# Patient Record
Sex: Male | Born: 1935
Health system: Southern US, Community
[De-identification: ages and names within clinical notes are randomized; demographics above are authoritative.]

## PROBLEM LIST (undated history)

## (undated) DIAGNOSIS — I350 Nonrheumatic aortic (valve) stenosis: Secondary | ICD-10-CM

## (undated) DIAGNOSIS — R0602 Shortness of breath: Secondary | ICD-10-CM

## (undated) DIAGNOSIS — I351 Nonrheumatic aortic (valve) insufficiency: Secondary | ICD-10-CM

## (undated) DIAGNOSIS — C3490 Malignant neoplasm of unspecified part of unspecified bronchus or lung: Secondary | ICD-10-CM

## (undated) DIAGNOSIS — I10 Essential (primary) hypertension: Secondary | ICD-10-CM

## (undated) DIAGNOSIS — E785 Hyperlipidemia, unspecified: Secondary | ICD-10-CM

## (undated) DIAGNOSIS — I5022 Chronic systolic (congestive) heart failure: Secondary | ICD-10-CM

## (undated) DIAGNOSIS — I2510 Atherosclerotic heart disease of native coronary artery without angina pectoris: Secondary | ICD-10-CM

## (undated) DIAGNOSIS — I340 Nonrheumatic mitral (valve) insufficiency: Secondary | ICD-10-CM

## (undated) HISTORY — DX: Essential (primary) hypertension: I10

## (undated) HISTORY — DX: Nonrheumatic mitral (valve) insufficiency: I34.0

## (undated) HISTORY — DX: Hyperlipidemia, unspecified: E78.5

## (undated) HISTORY — PX: EYE SURGERY: SHX253

---

## 2003-05-26 ENCOUNTER — Ambulatory Visit (HOSPITAL_COMMUNITY): Admission: RE | Admit: 2003-05-26 | Discharge: 2003-05-26 | Payer: Self-pay | Admitting: *Deleted

## 2011-02-02 ENCOUNTER — Encounter: Payer: Self-pay | Admitting: Cardiology

## 2011-02-02 ENCOUNTER — Ambulatory Visit (INDEPENDENT_AMBULATORY_CARE_PROVIDER_SITE_OTHER): Payer: Medicare Other | Admitting: Cardiology

## 2011-02-02 VITALS — BP 126/80 | HR 80 | Ht 70.0 in | Wt 185.0 lb

## 2011-02-02 DIAGNOSIS — I119 Hypertensive heart disease without heart failure: Secondary | ICD-10-CM

## 2011-02-02 DIAGNOSIS — E7800 Pure hypercholesterolemia, unspecified: Secondary | ICD-10-CM | POA: Insufficient documentation

## 2011-02-02 DIAGNOSIS — I350 Nonrheumatic aortic (valve) stenosis: Secondary | ICD-10-CM

## 2011-02-02 DIAGNOSIS — I359 Nonrheumatic aortic valve disorder, unspecified: Secondary | ICD-10-CM

## 2011-02-02 NOTE — Assessment & Plan Note (Signed)
The patient is on lovastatin 10 mg daily.  His LDL is slightly elevated.  Since we last saw him 18 months ago he has lost 26 pounds through diet and exercise.

## 2011-02-02 NOTE — Patient Instructions (Signed)
Your physician recommends that you continue on your current medications as directed. Please refer to the Current Medication list given to you today.  Your physician wants you to follow-up in: 1 year. You will receive a reminder letter in the mail two months in advance. If you don't receive a letter, please call our office to schedule the follow-up appointment.  

## 2011-02-02 NOTE — Progress Notes (Signed)
Gabriel Marsh Date of Birth:  06/27/1930 [facility] [address] [place], [place]  [address] [phone]  Fax   [phone]  HPI: This pleasant 78 year old gentleman from [facility] is seen for a scheduled followup office visit.  He has a history of essential hypertension and a history of moderate aortic stenosis.  His last echocardiogram was 07/28/09 at which time his peak systolic gradient was 50 and his mean gradient was 31.  The patient has been feeling well since last visit.  He has not been experiencing any chest pain or shortness of breath.  He said no dizzy spells or syncope.  He had a recent carotid Doppler at the [facility] which showed no stenosis.  He had recent lab work showing normal thyroid function and his LDL was slightly elevated at 108.  He goes to the [facility] 3 days a week for exercise which he enjoys.  Current Outpatient Prescriptions  Medication Sig Dispense Refill  . aspirin 81 MG tablet Take 160 mg by mouth daily.      . Cholecalciferol (VITAMIN D PO) Take 2,000 Int'l Units by mouth daily.      Marland Kitchen lovastatin (MEVACOR) 10 MG tablet Take 10 mg by mouth at bedtime.      . valsartan-hydrochlorothiazide (DIOVAN-HCT) 80-12.5 MG per tablet Take 1 tablet by mouth daily.        Allergies  Allergen Reactions  . Penicillins     There is no problem list on file for this patient.   History  Smoking status  . Former Smoker  Smokeless tobacco  . Not on file    History  Alcohol Use: Not on file    No family history on file.  Review of Systems: The patient denies any heat or cold intolerance.  No weight gain or weight loss.  The patient denies headaches or blurry vision.  There is no cough or sputum production.  The patient denies dizziness.  There is no hematuria or hematochezia.  The patient denies any muscle aches or arthritis.  The patient denies any rash.  The patient denies frequent falling or instability.  There is no  history of depression or anxiety.  All other systems were reviewed and are negative.   Physical Exam: Filed Vitals:   02/02/11 0842  BP: 126/80  Pulse: 80   The general appearance reveals a well-developed well-nourished elderly gentleman in no distress.Pupils equal and reactive.   Extraocular Movements are full.  There is no scleral icterus.  The mouth and pharynx are normal.  The neck is supple.  The carotids reveal no bruits.  The jugular venous pressure is normal.  The thyroid is not enlarged.  There is no lymphadenopathy.  The chest is clear to percussion and auscultation. There are no rales or rhonchi. Expansion of the chest is symmetrical.  The heart reveals a grade 3/6 harsh systolic ejection murmur at the base which radiates toward the neck.  No diastolic murmur, gallop or rub.The abdomen is soft and nontender. Bowel sounds are normal. The liver and spleen are not enlarged. There Are no abdominal masses. There are no bruits.  The pedal pulses are good.  There is no phlebitis or edema.  There is no cyanosis or clubbing. Strength is normal and symmetrical in all extremities.  There is no lateralizing weakness.  There are no sensory deficits.  EKG today shows normal sinus rhythm and no ischemic changes.  He has old Q waves in the inferior leads but  no prior history of an inferior wall MI EKG has been unchanged since July of 2000.   Assessment / Plan: Continue same medication.  Recheck in one year for office visit and EKG and after that consider followup echocardiogram.

## 2011-02-02 NOTE — Assessment & Plan Note (Signed)
The patient is not having any cardinal symptoms from his aortic stenosis.  Exercise tolerance remains good

## 2011-06-01 ENCOUNTER — Encounter: Payer: Self-pay | Admitting: *Deleted

## 2011-08-22 HISTORY — PX: CARDIAC CATHETERIZATION: SHX172

## 2011-08-23 ENCOUNTER — Inpatient Hospital Stay (HOSPITAL_COMMUNITY)
Admission: AD | Admit: 2011-08-23 | Discharge: 2011-09-08 | DRG: 219 | Disposition: A | Discharge status: Rehabilitation Facility | Payer: Medicare Other | Source: Other Acute Inpatient Hospital | Attending: Cardiothoracic Surgery | Admitting: Cardiothoracic Surgery

## 2011-08-23 ENCOUNTER — Encounter (HOSPITAL_COMMUNITY): Payer: Self-pay | Admitting: Cardiology

## 2011-08-23 ENCOUNTER — Other Ambulatory Visit: Payer: Self-pay | Admitting: Cardiothoracic Surgery

## 2011-08-23 DIAGNOSIS — D72829 Elevated white blood cell count, unspecified: Secondary | ICD-10-CM | POA: Diagnosis not present

## 2011-08-23 DIAGNOSIS — I472 Ventricular tachycardia, unspecified: Secondary | ICD-10-CM | POA: Diagnosis not present

## 2011-08-23 DIAGNOSIS — I350 Nonrheumatic aortic (valve) stenosis: Secondary | ICD-10-CM

## 2011-08-23 DIAGNOSIS — I5023 Acute on chronic systolic (congestive) heart failure: Secondary | ICD-10-CM

## 2011-08-23 DIAGNOSIS — E785 Hyperlipidemia, unspecified: Secondary | ICD-10-CM | POA: Diagnosis present

## 2011-08-23 DIAGNOSIS — R5381 Other malaise: Secondary | ICD-10-CM | POA: Diagnosis not present

## 2011-08-23 DIAGNOSIS — E119 Type 2 diabetes mellitus without complications: Secondary | ICD-10-CM | POA: Diagnosis present

## 2011-08-23 DIAGNOSIS — I2510 Atherosclerotic heart disease of native coronary artery without angina pectoris: Secondary | ICD-10-CM

## 2011-08-23 DIAGNOSIS — E876 Hypokalemia: Secondary | ICD-10-CM | POA: Diagnosis not present

## 2011-08-23 DIAGNOSIS — I4901 Ventricular fibrillation: Secondary | ICD-10-CM | POA: Diagnosis not present

## 2011-08-23 DIAGNOSIS — I469 Cardiac arrest, cause unspecified: Secondary | ICD-10-CM | POA: Diagnosis not present

## 2011-08-23 DIAGNOSIS — I2789 Other specified pulmonary heart diseases: Secondary | ICD-10-CM | POA: Diagnosis present

## 2011-08-23 DIAGNOSIS — I4891 Unspecified atrial fibrillation: Secondary | ICD-10-CM

## 2011-08-23 DIAGNOSIS — I214 Non-ST elevation (NSTEMI) myocardial infarction: Secondary | ICD-10-CM

## 2011-08-23 DIAGNOSIS — I4729 Other ventricular tachycardia: Secondary | ICD-10-CM | POA: Diagnosis not present

## 2011-08-23 DIAGNOSIS — I080 Rheumatic disorders of both mitral and aortic valves: Principal | ICD-10-CM | POA: Diagnosis present

## 2011-08-23 DIAGNOSIS — I359 Nonrheumatic aortic valve disorder, unspecified: Secondary | ICD-10-CM

## 2011-08-23 DIAGNOSIS — J9600 Acute respiratory failure, unspecified whether with hypoxia or hypercapnia: Secondary | ICD-10-CM

## 2011-08-23 DIAGNOSIS — I2589 Other forms of chronic ischemic heart disease: Secondary | ICD-10-CM | POA: Diagnosis present

## 2011-08-23 DIAGNOSIS — J95821 Acute postprocedural respiratory failure: Secondary | ICD-10-CM | POA: Diagnosis not present

## 2011-08-23 DIAGNOSIS — I340 Nonrheumatic mitral (valve) insufficiency: Secondary | ICD-10-CM | POA: Diagnosis present

## 2011-08-23 DIAGNOSIS — R339 Retention of urine, unspecified: Secondary | ICD-10-CM | POA: Diagnosis not present

## 2011-08-23 DIAGNOSIS — I4721 Torsades de pointes: Secondary | ICD-10-CM

## 2011-08-23 DIAGNOSIS — I5022 Chronic systolic (congestive) heart failure: Secondary | ICD-10-CM

## 2011-08-23 DIAGNOSIS — Z79899 Other long term (current) drug therapy: Secondary | ICD-10-CM

## 2011-08-23 DIAGNOSIS — D62 Acute posthemorrhagic anemia: Secondary | ICD-10-CM | POA: Diagnosis not present

## 2011-08-23 DIAGNOSIS — I351 Nonrheumatic aortic (valve) insufficiency: Secondary | ICD-10-CM

## 2011-08-23 DIAGNOSIS — K120 Recurrent oral aphthae: Secondary | ICD-10-CM | POA: Diagnosis not present

## 2011-08-23 DIAGNOSIS — I10 Essential (primary) hypertension: Secondary | ICD-10-CM | POA: Diagnosis present

## 2011-08-23 DIAGNOSIS — Z7982 Long term (current) use of aspirin: Secondary | ICD-10-CM

## 2011-08-23 DIAGNOSIS — R570 Cardiogenic shock: Secondary | ICD-10-CM

## 2011-08-23 HISTORY — DX: Nonrheumatic aortic (valve) insufficiency: I35.1

## 2011-08-23 HISTORY — DX: Shortness of breath: R06.02

## 2011-08-23 HISTORY — DX: Nonrheumatic aortic (valve) stenosis: I35.0

## 2011-08-23 HISTORY — DX: Atherosclerotic heart disease of native coronary artery without angina pectoris: I25.10

## 2011-08-23 HISTORY — DX: Chronic systolic (congestive) heart failure: I50.22

## 2011-08-23 LAB — CARDIAC PANEL(CRET KIN+CKTOT+MB+TROPI)
CK, MB: 8.8 ng/mL (ref 0.3–4.0)
CK, MB: 7.2 ng/mL (ref 0.3–4.0)
Relative Index: 7.9 — ABNORMAL HIGH (ref 0.0–2.5)
Total CK: 112 U/L (ref 7–232)
Total CK: 98 U/L (ref 7–232)
Troponin I: 1.84 ng/mL (ref <0.30)

## 2011-08-23 LAB — CBC
HCT: 36.7 % — ABNORMAL LOW (ref 39.0–52.0)
Hemoglobin: 12.4 g/dL — ABNORMAL LOW (ref 13.0–17.0)
MCH: 30.2 pg (ref 26.0–34.0)
RBC: 4.11 MIL/uL — ABNORMAL LOW (ref 4.22–5.81)

## 2011-08-23 LAB — BASIC METABOLIC PANEL
BUN: 26 mg/dL — ABNORMAL HIGH (ref 6–23)
CO2: 26 mEq/L (ref 19–32)
Glucose, Bld: 101 mg/dL — ABNORMAL HIGH (ref 70–99)
Potassium: 3.9 mEq/L (ref 3.5–5.1)
Sodium: 138 mEq/L (ref 135–145)

## 2011-08-23 LAB — MRSA PCR SCREENING: MRSA by PCR: NEGATIVE

## 2011-08-23 MED ORDER — SODIUM CHLORIDE 0.9 % IJ SOLN
3.0000 mL | INTRAMUSCULAR | Status: DC | PRN
  Administered 2011-08-24: 10:00:00 via INTRAVENOUS

## 2011-08-23 MED ORDER — HEPARIN SODIUM (PORCINE) 5000 UNIT/ML IJ SOLN
5000.0000 [IU] | Freq: Three times a day (TID) | INTRAMUSCULAR | Status: DC
  Administered 2011-08-23 (×2): 5000 [IU] via SUBCUTANEOUS
  Filled 2011-08-23 (×3): qty 1

## 2011-08-23 MED ORDER — ASPIRIN 81 MG PO CHEW
81.0000 mg | CHEWABLE_TABLET | Freq: Every day | ORAL | Status: DC
  Administered 2011-08-23 – 2011-08-24 (×2): 81 mg via ORAL
  Filled 2011-08-23 (×2): qty 1

## 2011-08-23 MED ORDER — ZOLPIDEM TARTRATE 5 MG PO TABS
5.0000 mg | ORAL_TABLET | Freq: Every evening | ORAL | Status: DC | PRN
  Administered 2011-08-24: 5 mg via ORAL
  Filled 2011-08-23: qty 1

## 2011-08-23 MED ORDER — MORPHINE SULFATE 2 MG/ML IJ SOLN
1.0000 mg | Freq: Once | INTRAMUSCULAR | Status: AC
  Administered 2011-08-24: 1 mg via INTRAVENOUS
  Filled 2011-08-23: qty 1

## 2011-08-23 MED ORDER — ALPRAZOLAM 0.25 MG PO TABS: 0.2500 mg | ORAL_TABLET | Freq: Two times a day (BID) | ORAL | Status: DC | PRN

## 2011-08-23 MED ORDER — IRBESARTAN 75 MG PO TABS
75.0000 mg | ORAL_TABLET | Freq: Every day | ORAL | Status: DC
  Administered 2011-08-23: 75 mg via ORAL
  Filled 2011-08-23 (×2): qty 1

## 2011-08-23 MED ORDER — SODIUM CHLORIDE 0.9 % IJ SOLN
3.0000 mL | Freq: Two times a day (BID) | INTRAMUSCULAR | Status: DC
  Administered 2011-08-23 – 2011-08-24 (×3): 3 mL via INTRAVENOUS

## 2011-08-23 MED ORDER — ASPIRIN 81 MG PO TABS: 81.0000 mg | ORAL_TABLET | Freq: Every day | ORAL | Status: DC

## 2011-08-23 MED ORDER — ACETAMINOPHEN 325 MG PO TABS
650.0000 mg | ORAL_TABLET | ORAL | Status: DC | PRN
  Administered 2011-08-23: 650 mg via ORAL
  Filled 2011-08-23: qty 2

## 2011-08-23 MED ORDER — SIMVASTATIN 5 MG PO TABS
5.0000 mg | ORAL_TABLET | Freq: Every day | ORAL | Status: DC
  Administered 2011-08-23 – 2011-09-07 (×11): 5 mg via ORAL
  Filled 2011-08-23 (×18): qty 1

## 2011-08-23 MED ORDER — SODIUM CHLORIDE 0.9 % IV BOLUS (SEPSIS)
250.0000 mL | Freq: Once | INTRAVENOUS | Status: AC
  Administered 2011-08-23: 250 mL via INTRAVENOUS

## 2011-08-23 MED ORDER — ONDANSETRON HCL 4 MG/2ML IJ SOLN
4.0000 mg | Freq: Four times a day (QID) | INTRAMUSCULAR | Status: DC | PRN
  Administered 2011-08-23 – 2011-08-24 (×3): 4 mg via INTRAVENOUS
  Filled 2011-08-23 (×3): qty 2

## 2011-08-23 MED ORDER — NITROGLYCERIN 0.4 MG SL SUBL: 0.4000 mg | SUBLINGUAL_TABLET | SUBLINGUAL | Status: DC | PRN

## 2011-08-23 MED ORDER — CARVEDILOL 3.125 MG PO TABS
3.1250 mg | ORAL_TABLET | Freq: Two times a day (BID) | ORAL | Status: DC
  Administered 2011-08-23: 3.125 mg via ORAL
  Filled 2011-08-23 (×4): qty 1

## 2011-08-23 MED ORDER — DOPAMINE-DEXTROSE 3.2-5 MG/ML-% IV SOLN
2.5000 ug/kg/min | INTRAVENOUS | Status: DC
  Administered 2011-08-23: 2.5 ug/kg/min via INTRAVENOUS
  Administered 2011-08-25: 10 ug/kg/min via INTRAVENOUS
  Filled 2011-08-23 (×2): qty 250

## 2011-08-23 MED ORDER — SODIUM CHLORIDE 0.9 % IV SOLN: 250.0000 mL | INTRAVENOUS | Status: DC | PRN

## 2011-08-23 NOTE — Progress Notes (Signed)
CRITICAL VALUE ALERT  Critical value received:  CKMB 8.8  Date of notification:  08/23/11  Time of notification: 1700  Critical value read back: Yes  Nurse who received alert:  Suzzette Righter  MD notified (1st page):  Hurman Horn   Time of first page:  1715 MD notified (2nd page):  Time of second page:  Responding MD:  Hurman Horn  No orders received  Time MD responded:  [phone]

## 2011-08-23 NOTE — Progress Notes (Signed)
Patient ID: Gabriel Marsh, male   DOB: 1930/02/21, 78 y.o.   MRN: [id number]                    [address]            [place] [address]          [phone]       [facility] Medical Record #[id number] Date of Birth: March 02, 1930  Referring: Dr Patty Sermons since 1989 Primary Care: Dr Ovidio Kin, Renne Musca   Chief Complaint:   Rt shoulder pain and SOB for week   History of Present Illness:    Gabriel Marsh is a 78yo Caucasian male transfereed  Because one week history of increasing sob and exercise induced rt  shoulder pain The patient noted worsening shortness of breath with orthopnea and PND over one  weeks prior to initial admission at OSH. He presented to the [facility] He was admitted to [facility] with flash pulmonary edema secondary to acute systolic CHF (the chronicity of which is unclear, no prior echo on record). He was noted to have a mild troponin-I elevated characterized as type 2 NSTEMI in the setting of acute CHF exacerbation. He was given treated medically and under went cath and echo.    PMHx significant for severe AS (on echo at OSH on 08/20/11; moderate in 2011), CAD (cath at OSH 08/22/11 : 50-60% left main stenosis, mild-moderate LAD disease, 99% ostial large diagonal stenosis, occluded and collateralized LCx and RCA), newly diagnosed systolic CHF (echo 08/20/11: LVEF 35-40% inferior wall akinesis RHC 08/19/11: RV pressure 61/5, PCWP 31, RA pressure 13, Fick CO 5.5 lpm), moderate AI, moderate MR, hyperlipidemia and hypertension who was transferred to [facility] from [facility] to undergo CABG + AVR. The patient initially presented with flash pulmonary edema, diuresed and started on HF medications, underwent cath and echo with the above findings. The patient requested to be transferred here due to his care being mostly here in [place].   Current Activity/ Functional Status: Patient is independent with  mobility/ambulation, transfers, ADL's, IADL's.   Past Medical History  Diagnosis Date  . Hypertension   . Hyperlipidemia   . Mitral regurgitation     Moderate by 08/20/11 echo  . Shortness of breath   . CAD (coronary artery disease)     a) 08/22/11 cath :  50-60% left main stenosis, mild-moderate LAD disease, 99% ostial large diagonal stenosis, occluded and collateralized LCx and RCA  . Aortic stenosis     Severe by 08/20/11 echo  . Aortic insufficiency     Moderate by 08/20/11 echo  . Chronic systolic CHF (congestive heart failure)     a) 08/20/11 echo : LVEF 35-40%    Past Surgical History  Procedure Date  . Eye surgery   . Cardiac catheterization 08/22/11     50-60% left main stenosis, mild-moderate LAD disease, 99% ostial large diagonal stenosis, occluded and collateralized LCx and RCA    History  Smoking status  . Former Smoker  . Quit date: 01/25/1995  Smokeless tobacco  . Not on file    History  Alcohol Use No    History   Social History  . Marital Status: Single    Spouse Name: N/A    Number of Children: N/A  . Years of Education: N/A   Occupational History  . Desk work for [employer]   Social History Main Topics  . Smoking  status: Former Smoker    Quit date: 01/25/1995  . Smokeless tobacco: Not on file  . Alcohol Use: No  . Drug Use: No                     Allergies  Allergen Reactions  . Fish Allergy   . Lisinopril     cough  . Penicillins     Hives   . Statins     Leg cramps  . Sulfa Antibiotics     Hives     Current Facility-Administered Medications  Medication Dose Route Frequency Provider Last Rate Last Dose  . 0.9 %  sodium chloride infusion  250 mL Intravenous PRN Roger A Arguello, PA-C      . acetaminophen (TYLENOL) tablet 650 mg  650 mg Oral Q4H PRN Roger A Arguello, PA-C      . ALPRAZolam Prudy Feeler) tablet 0.25 mg  0.25 mg Oral BID PRN Gery Pray, PA-C      . aspirin chewable tablet 81 mg  81 mg Oral Daily  Herby Abraham, MD      . carvedilol (COREG) tablet 3.125 mg  3.125 mg Oral BID WC Roger A Arguello, PA-C      . heparin injection 5,000 Units  5,000 Units Subcutaneous Q8H Roger A Arguello, PA-C      . irbesartan (AVAPRO) tablet 75 mg  75 mg Oral Daily Roger A Arguello, PA-C      . nitroGLYCERIN (NITROSTAT) SL tablet 0.4 mg  0.4 mg Sublingual Q5 Min x 3 PRN Roger A Arguello, PA-C      . ondansetron (ZOFRAN) injection 4 mg  4 mg Intravenous Q6H PRN Roger A Arguello, PA-C      . simvastatin (ZOCOR) tablet 5 mg  5 mg Oral q1800 Roger A Arguello, PA-C      . sodium chloride 0.9 % injection 3 mL  3 mL Intravenous Q12H Roger A Arguello, PA-C      . sodium chloride 0.9 % injection 3 mL  3 mL Intravenous PRN Roger A Arguello, PA-C      . zolpidem (AMBIEN) tablet 5 mg  5 mg Oral QHS PRN Roger A Arguello, PA-C      . DISCONTD: aspirin tablet 81 mg  81 mg Oral Daily Roger A Arguello, PA-C        Prescriptions prior to admission  Medication Sig Dispense Refill  . aspirin 81 MG tablet Take 160 mg by mouth daily.      Marland Kitchen DISCONTD: Cholecalciferol (VITAMIN D PO) Take 2,000 Int'l Units by mouth daily.      Marland Kitchen DISCONTD: valsartan-hydrochlorothiazide (DIOVAN-HCT) 80-12.5 MG per tablet Take 1 tablet by mouth daily.        Family History  Problem Relation Age of Onset  . Heart disease Brother   Mother died liver cancer Father died of leukemia   Review of Systems:     Cardiac Review of Systems: Y or N  Chest Pain [  n  ]  Resting SOB [n   ] Exertional SOB  [ y ]  Orthopnea [  y]   Pedal Edema [ n  ]    Palpitations [n  ] Syncope  [ n ]   Presyncope [ n  ]  General Review of Systems: [Y] = yes [N  ]=no Constitional: recent weight change [ loss ]; anorexia [  ]; fatigue [ y ]; nausea [n  ]; night sweats [ n ]; fever [  n]; or chills [ n ];                                                                                                                                          Dental: poor dentition[ gone   ]; Last Dentist visit: 50 years ago  Eye : blurred vision [  ]; diplopia [  n ]; vision changes [  n];  Amaurosis fugax[n  ]; VERY HARD OF HEARING Resp: cough [  ];  wheezing[  ];  hemoptysis[  ]; shortness of breath[  ]; paroxysmal nocturnal dyspnea[  ]; dyspnea on exertion[  ]; or orthopnea[  ];  GI:  gallstones[  ], vomiting[  ];  dysphagia[  ]; melena[  ];  hematochezia [  ]; heartburn[  ];   Hx of  Colonoscopy[  ]; GU: kidney stones [  ]; hematuria[ n ];   dysuria [  ];  Nocturia[4-5 times per night  ];  history of     obstruction [  ];             Skin: rash, swelling[  ];, hair loss[  ];  peripheral edema[  ];  or itching[  ]; Musculosketetal: myalgias[  ];  joint swelling[  ];  joint erythema[  ];  joint pain[  ];  back pain[  ];  Heme/Lymph: bruising[  ];  bleeding[  ];  anemia[  ];  Neuro: TIA[ n ];  headaches[  ];  stroke[  ];  vertigo[  ];  seizures[n  ];   paresthesias[  ];  difficulty walking[ n ];  Psych:depression[  ]; anxiety[  ];  Endocrine: diabetes[  ];  thyroid dysfunction[  ];  Immunizations: Flu Cove.Etienne  ]; Pneumococcal[ y ];  Other:  Physical Exam: BP 101/63  Pulse 71  Temp 98.3 F (36.8 C) (Oral)  Resp 17  Ht 5\' 10"  (1.778 m)  Wt 174 lb 9.7 oz (79.2 kg)  BMI 25.05 kg/m2  SpO2 96%  General appearance: alert, cooperative, appears stated age, no distress and very hard of hearing Neurologic: intact Heart: regular rate and rhythm and systolic murmur: holosystolic 3/6, crescendo at lower left sternal border, at apex Lungs: clear to auscultation bilaterally and normal percussion bilaterally Abdomen: soft, non-tender; bowel sounds normal; no masses,  no organomegaly Extremities: extremities normal, atraumatic, no cyanosis or edema and Homans sign is negative, no sign of DVT rt cath site ok No carotid bruits No cervical, axillary or groin adenotathy  Diagnostic Studies & Laboratory data:   severe AS (on echo at OSH on 08/20/11; moderate in 2011), CAD (cath at OSH  08/22/11 : 50-60% left main stenosis, mild-moderate LAD disease, 99% ostial large diagonal stenosis, occluded and collateralized LCx and RCA), newly diagnosed systolic CHF (echo 08/20/11: LVEF 35-40% inferior wall akinesis RHC 08/19/11: RV pressure 61/5, PCWP 31, RA pressure 13, Fick CO 5.5 lpm),  moderate AI, moderate MR,    Recent Radiology Findings:     Recent Lab Findings:  Lab Results  Component Value Date   WBC 8.2 08/23/2011   HGB 12.4* 08/23/2011   HCT 36.7* 08/23/2011   PLT 242 08/23/2011   GLUCOSE 101* 08/23/2011   NA 138 08/23/2011   K 3.9 08/23/2011   CL 102 08/23/2011   CREATININE 0.79 08/23/2011   BUN 26* 08/23/2011   CO2 26 08/23/2011    Lab Results  Component Value Date   CKTOTAL 112 08/23/2011   CKMB 8.8* 08/23/2011   TROPONINI 1.84* 08/23/2011      Assessment / Plan:   Acute systolic congestive heart failure secondary to critical AS and CAD Non st elevation MI Cardiomyopathy EF by echo 35-40 % with pulmonary hypertension  Plan:  CABG with tissue valve aortic valve replacement Friday or Monday depending on OR schedule.   The goals risks and alternatives of the planned surgical procedure CABG and AVR  have been discussed with the patient in detail. The risks of the procedure including death, infection, stroke, myocardial infarction, bleeding, blood transfusion possible need for pacer placement have all been discussed specifically.  I have quoted [name] a 5 % of perioperative mortality and a complication rate as high as 20 %. The patient's questions have been answered.Gabriel Marsh is willing  to proceed with the planned procedure.     Delight Ovens MD  Beeper [phone] Office [phone] 08/23/2011 5:06 PM

## 2011-08-23 NOTE — H&P (Signed)
History and Physical   Patient ID: Gabriel Marsh MRN: [id number], DOB/AGE: 24-Nov-1930, 78   Admit date: 08/23/2011 Date of Consult: 08/23/2011   Primary Physician: No primary provider on file. Primary Cardiologist: Ronny Flurry, MD  Pt. Profile: Gabriel Marsh is a 78yo Caucasian male with PMHx significant for severe AS (on echo at OSH on 08/20/11; moderate in 2011), CAD (cath at OSH 08/22/11 : 50-60% left main stenosis, mild-moderate LAD disease, 99% ostial large diagonal stenosis, occluded and collateralized LCx and RCA), newly diagnosed systolic CHF (echo 08/20/11: LVEF 35-40% inferior wall akinesis RHC 08/19/11: RV pressure 61/5, PCWP 31, RA pressure 13, Fick CO 5.5 lpm), moderate AI, moderate MR, hyperlipidemia and hypertension who was transferred to [facility] hospital from [facility] to undergo CABG + AVR. The patient initially presented with flash pulmonary edema, diuresed and started on HF medications, underwent cath and echo with the above findings. The patient requested to be transferred here due to his care being mostly here in [place].   HPI:   The patient lives in [place], [place], but follows up with Dr. Patty Sermons here in [place]. He last saw him in 01/2011. At that time, he denied chest pain, shortness of breath, lightheadedness or syncope. He had noted to be quite active, going to the [facility] 3x/wk without incident conferring good exercise tolerance.   The patient noted worsening shortness of breath with orthopnea and PND over 1-2 weeks prior to initial admission at OSH. He presented to the [facility] He was admitted to [facility] with flash pulmonary edema secondary to acute systolic CHF (the chronicity of which is unclear, no prior echo on record). He was noted to have a mild troponin-I elevated characterized as type 2 NSTEMI in the setting of acute CHF exacerbation. He was diuresed, and a 2D echo was performed revealing LVEF 35-40%, severe AS, moderate AI/MR and inferior  wall akinesis. Subsequent cardiac cath revealed the above findings (pre-AVR cath). The decision was made to pursue CABG, and for that reason, simultaneous open AVR was elected over transcatheter AVR. The patient elected to have this performed in [place], as that is where he follows up with Dr. Patty Sermons. He arrived via EMS in stable condition, pain free, respirating easily and is comfortable.   Problem List: Past Medical History  Diagnosis Date  . Hypertension   . Hyperlipidemia   . Mitral regurgitation     Moderate by 08/20/11 echo  . Shortness of breath   . CAD (coronary artery disease)     a) 08/22/11 cath :  50-60% left main stenosis, mild-moderate LAD disease, 99% ostial large diagonal stenosis, occluded and collateralized LCx and RCA  . Aortic stenosis     Severe by 08/20/11 echo  . Aortic insufficiency     Moderate by 08/20/11 echo  . Chronic systolic CHF (congestive heart failure)     a) 08/20/11 echo : LVEF 35-40%    Past Surgical History  Procedure Date  . Eye surgery   . Cardiac catheterization 08/22/11     50-60% left main stenosis, mild-moderate LAD disease, 99% ostial large diagonal stenosis, occluded and collateralized LCx and RCA     Allergies:  Allergies  Allergen Reactions  . Fish Allergy   . Lisinopril     cough  . Penicillins     Hives   . Statins     Leg cramps  . Sulfa Antibiotics     Hives     Home Medications: Prior to Admission medications  Medication Sig Start Date End Date Taking? Authorizing Provider  aspirin 81 MG tablet Take 160 mg by mouth daily.    Historical Provider, MD  Cholecalciferol (VITAMIN D PO) Take 2,000 Int'l Units by mouth daily.    Historical Provider, MD  lovastatin (MEVACOR) 10 MG tablet Take 10 mg by mouth at bedtime.    Historical Provider, MD  valsartan-hydrochlorothiazide (DIOVAN-HCT) 80-12.5 MG per tablet Take 1 tablet by mouth daily.    Historical Provider, MD    Inpatient Medications:     . aspirin  81 mg  Oral Daily  . carvedilol  3.125 mg Oral BID WC  . heparin  5,000 Units Subcutaneous Q8H  . irbesartan  75 mg Oral Daily  . simvastatin  5 mg Oral q1800  . sodium chloride  3 mL Intravenous Q12H   Prescriptions prior to admission  Medication Sig Dispense Refill  . aspirin 81 MG tablet Take 160 mg by mouth daily.      Marland Kitchen DISCONTD: Cholecalciferol (VITAMIN D PO) Take 2,000 Int'l Units by mouth daily.      Marland Kitchen DISCONTD: valsartan-hydrochlorothiazide (DIOVAN-HCT) 80-12.5 MG per tablet Take 1 tablet by mouth daily.        Family History  Problem Relation Age of Onset  . Heart disease Brother      History   Social History  . Marital Status: Single    Spouse Name: N/A    Number of Children: N/A  . Years of Education: N/A   Occupational History  . Not on file.   Social History Main Topics  . Smoking status: Former Smoker    Quit date: 01/25/1995  . Smokeless tobacco: Not on file  . Alcohol Use: No  . Drug Use: No  . Sexually Active: Not on file   Other Topics Concern  . Not on file   Social History Narrative  . No narrative on file     Review of Systems: General: negative for chills, fever, night sweats or weight changes.  Cardiovascular: positive for shortness of breath, PND, DOE, negative for chest pain, orthopnea, palpitations or shortness of breath Dermatological: negative for rash Respiratory: negative for cough or wheezing Urologic: negative for hematuria Abdominal: negative for nausea, vomiting, diarrhea, bright red blood per rectum, melena, or hematemesis Neurologic:  negative for visual changes, syncope, or dizziness All other systems reviewed and are otherwise negative except as noted above.  Physical Exam: Blood pressure 101/63, pulse 71, temperature 98.3 F (36.8 C), temperature source Oral, resp. rate 17, height 5\' 10"  (1.778 m), weight 79.2 kg (174 lb 9.7 oz), SpO2 96.00%.    General: Elderly, well developed, well nourished, in no acute distress. Head:  Normocephalic, atraumatic, sclera non-icteric, no xanthomas, nares are without discharge.  Neck: Negative for carotid bruits. JVD not elevated. Lungs: Clear bilaterally to auscultation without wheezes, rales, or rhonchi. Breathing is unlabored. Heart: Regularly irregular, harsh III/VI systolic crescendo-decrescendo murmur at LUSB, with S1 S2. No murmurs, rubs, or gallops appreciated. Abdomen: Soft, non-tender, non-distended with normoactive bowel sounds. No hepatomegaly. No rebound/guarding. No obvious abdominal masses. Msk:  Strength and tone appears normal for age. Extremities: No clubbing, cyanosis or edema.  Distal pedal pulses are 2+ and equal bilaterally. Neuro: Alert and oriented X 3. Moves all extremities spontaneously. Psych:  Responds to questions appropriately with a normal affect.  Labs:  Pending  Radiology/Studies: No results found.  EKG: pending, at OSH revealed evidence of trigeminy, no ST-T wave changes  ASSESSMENT AND PLAN:   1.  Severe AS 2. CAD 3. Chronic systolic CHF 4. Hypertension 5. Hyperlipidemia 6. Moderate AI 7. Moderate MR  DICUSSION/PLAN:  The patient was transferred from [facility] to [facility] to coordinate CABG + open AVR in [place], and continue prior cardiac care with Dr. Patty Sermons. He arrived this afternoon and is comfortable without complaints. Will continue discharge medications from OSH including ARB and BB. Patient apparently has an allergy to lisinopril. Will continue ASA, statin (LDL 101 on 08/19/11), NTG SL PRN. [facility] is aware of patient's arrival. Cath films are up on server for review. Will defer further recommendations from a surgical standpoint to them. Admission orders placed. Will get CBC, BMET, PT/INR and cardiac panel. Heart healthy diet ordered. Will continue to monitor for need to diurese. Strict I/Os and daily weights have been ordered. Further recommendations per MD.    Signed, R. Hurman Horn,  PA-C 08/23/2011, 4:30 PM

## 2011-08-23 NOTE — Care Management Note (Signed)
    Page 1 of 2   09/09/2011     2:22:52 PM   CARE MANAGEMENT NOTE 09/09/2011  Patient:  Gabriel Marsh, Gabriel Marsh   Account Number:  [id number]  Date Initiated:  08/23/2011  Documentation initiated by:  Junius Creamer  Subjective/Objective Assessment:   adm from [facility] after pul edema and pos cath-for cabg and valve rep     Action/Plan:   lives alone, pcp dr Gabriel Marsh is his cardiologist   Anticipated DC Date:  09/02/2011   Anticipated DC Plan:  IP REHAB FACILITY      DC Planning Services  CM consult      Choice offered to / List presented to:             Status of service:  Completed, signed off Medicare Important Message given?   (If response is "NO", the following Medicare IM given date fields will be blank) Date Medicare IM given:   Date Additional Medicare IM given:    Discharge Disposition:  IP REHAB FACILITY  Per UR Regulation:  Reviewed for med. necessity/level of care/duration of stay  If discussed at Long Length of Stay Meetings, dates discussed:    Comments:  09/08/11 Gabriel Molinelli,RN,BSN PT DISCHARGED TO IP REHAB TODAY.  09/07/11 Gabriel Brigandi,RN,BSN 1130 CALLED [facility] [phone]), AS I HAVE NOT HEARD BACK FROM DR BALE'S OFFICE.  CONTACT STATES THAT DC MED RX NEED TO BE FAXED TO DR BALE'S OFFICE ASAP (FAX [phone]), [facility] BALE [facility], AND THEN PT'S NIECE CAN PICK UP RX WHEN READY. EXPLAINED TO NIECE THAT WE DO NOT KNOW FINAL DC MEDS AT THIS TIME, AS PT STILL UNDERGOING MULT MED ADJUSTMENTS.  08/30/11 Gabriel Fatula,RN,BSN 1100 PT'S NIECE GIVEN RESOURCE INFO FOR LIFT CHAIR RENTALS IN THE AREA. SHE IS CONSIDERING POSSIBLY RENTING OR BUYING ONE FOR PT WHEN HE IS DC'D.  LEFT MESSAGE FOR DR BALE'S OFFICE (PT[facility] PR[facility]) TO SPEAK WITH SOMEONE ABOUT PT OBTAINING DC MEDS.  WILL FOLLOW.  08/26/11 Gabriel Jarrett,RN,BSN 1200 PT S/P CABG AND AVR ON 08/25/11.  PTA, PT INDEPENDENT, LIVES ALONE IN [place].  PT REMAINS  INTUBATED; MET WITH PT'S NIECE, Gabriel Marsh, AT BEDSIDE, TO DISCUSS DC PLANS.  NIECE STATES PT WILL STAY WITH HER IN [place] FOR A MONTH OR LONGER UNTIL ABLE TO RETURN TO [place] ALONE.  IF PT NEEDS REHAB, NIECE REQUESTS IP REHAB,AS THEY DO NOT WANT SNF.  PT HAS [employer], AND REQUESTS IF POSSIBLE THAT DC RX BE GIVEN TO HER A DAY OR SO PRIOR TO DC SO THAT SHE CAN GO TO [place] TO GET RX FILLED.  WILL FOLLOW UP AS PT PROGRESSES.  7/30 16:48p Gabriel dowell rn,bsn [phone]

## 2011-08-24 ENCOUNTER — Encounter (HOSPITAL_COMMUNITY)
Admission: AD | Disposition: A | Discharge status: Rehabilitation Facility | Payer: Self-pay | Source: Other Acute Inpatient Hospital | Attending: Cardiothoracic Surgery

## 2011-08-24 ENCOUNTER — Inpatient Hospital Stay (HOSPITAL_COMMUNITY): Payer: Medicare Other

## 2011-08-24 ENCOUNTER — Encounter (HOSPITAL_COMMUNITY): Payer: Medicare Other

## 2011-08-24 DIAGNOSIS — I469 Cardiac arrest, cause unspecified: Secondary | ICD-10-CM

## 2011-08-24 DIAGNOSIS — I2510 Atherosclerotic heart disease of native coronary artery without angina pectoris: Secondary | ICD-10-CM

## 2011-08-24 DIAGNOSIS — R570 Cardiogenic shock: Secondary | ICD-10-CM

## 2011-08-24 DIAGNOSIS — I059 Rheumatic mitral valve disease, unspecified: Secondary | ICD-10-CM

## 2011-08-24 DIAGNOSIS — Z01810 Encounter for preprocedural cardiovascular examination: Secondary | ICD-10-CM

## 2011-08-24 DIAGNOSIS — I359 Nonrheumatic aortic valve disorder, unspecified: Secondary | ICD-10-CM

## 2011-08-24 DIAGNOSIS — I214 Non-ST elevation (NSTEMI) myocardial infarction: Secondary | ICD-10-CM

## 2011-08-24 HISTORY — PX: INTRA-AORTIC BALLOON PUMP INSERTION: SHX5475

## 2011-08-24 LAB — COMPREHENSIVE METABOLIC PANEL
ALT: 39 U/L (ref 0–53)
AST: 165 U/L — ABNORMAL HIGH (ref 0–37)
Albumin: 3 g/dL — ABNORMAL LOW (ref 3.5–5.2)
Alkaline Phosphatase: 57 U/L (ref 39–117)
BUN: 35 mg/dL — ABNORMAL HIGH (ref 6–23)
CO2: 26 mEq/L (ref 19–32)
Calcium: 8.8 mg/dL (ref 8.4–10.5)
Chloride: 102 mEq/L (ref 96–112)
Creatinine, Ser: 0.72 mg/dL (ref 0.50–1.35)
GFR calc Af Amer: >90 mL/min (ref >90)
GFR calc non Af Amer: 86 mL/min — ABNORMAL LOW (ref >90)
Glucose, Bld: 134 mg/dL — ABNORMAL HIGH (ref 70–99)
Potassium: 3.7 mEq/L (ref 3.5–5.1)
Sodium: 138 mEq/L (ref 135–145)
Total Bilirubin: 0.4 mg/dL (ref 0.3–1.2)
Total Protein: 6.5 g/dL (ref 6.0–8.3)

## 2011-08-24 LAB — BASIC METABOLIC PANEL
BUN: 34 mg/dL — ABNORMAL HIGH (ref 6–23)
BUN: 36 mg/dL — ABNORMAL HIGH (ref 6–23)
CO2: 26 mEq/L (ref 19–32)
CO2: 26 mEq/L (ref 19–32)
Calcium: 8.8 mg/dL (ref 8.4–10.5)
Chloride: 105 mEq/L (ref 96–112)
Chloride: 103 mEq/L (ref 96–112)
Creatinine, Ser: 0.8 mg/dL (ref 0.50–1.35)
GFR calc Af Amer: 90 mL/min — ABNORMAL LOW (ref >90)
GFR calc Af Amer: >90 mL/min (ref >90)
GFR calc non Af Amer: 83 mL/min — ABNORMAL LOW (ref >90)
Glucose, Bld: 134 mg/dL — ABNORMAL HIGH (ref 70–99)
Glucose, Bld: 162 mg/dL — ABNORMAL HIGH (ref 70–99)
Potassium: 3.7 mEq/L (ref 3.5–5.1)
Potassium: 3.6 mEq/L (ref 3.5–5.1)
Sodium: 138 mEq/L (ref 135–145)

## 2011-08-24 LAB — ABO/RH: ABO/RH(D): B NEG

## 2011-08-24 LAB — CBC
HCT: 36.6 % — ABNORMAL LOW (ref 39.0–52.0)
HCT: 34.9 % — ABNORMAL LOW (ref 39.0–52.0)
Hemoglobin: 12.3 g/dL — ABNORMAL LOW (ref 13.0–17.0)
Hemoglobin: 11.6 g/dL — ABNORMAL LOW (ref 13.0–17.0)
MCH: 29.7 pg (ref 26.0–34.0)
MCHC: 33.2 g/dL (ref 30.0–36.0)
MCV: 90.8 fL (ref 78.0–100.0)
MCV: 89.5 fL (ref 78.0–100.0)
Platelets: 207 10*3/uL (ref 150–400)
RBC: 4.03 MIL/uL — ABNORMAL LOW (ref 4.22–5.81)
RBC: 3.9 MIL/uL — ABNORMAL LOW (ref 4.22–5.81)
RDW: 13.8 % (ref 11.5–15.5)
RDW: 13.6 % (ref 11.5–15.5)
WBC: 10.7 10*3/uL — ABNORMAL HIGH (ref 4.0–10.5)
WBC: 11.9 10*3/uL — ABNORMAL HIGH (ref 4.0–10.5)

## 2011-08-24 LAB — URINALYSIS, ROUTINE W REFLEX MICROSCOPIC
Bilirubin Urine: NEGATIVE
Glucose, UA: NEGATIVE mg/dL
Ketones, ur: NEGATIVE mg/dL
Nitrite: NEGATIVE
Protein, ur: 30 mg/dL — AB
Specific Gravity, Urine: 1.019 (ref 1.005–1.030)
Urobilinogen, UA: 1 mg/dL (ref 0.0–1.0)
pH: 5.5 (ref 5.0–8.0)

## 2011-08-24 LAB — APTT: aPTT: 75 seconds — ABNORMAL HIGH (ref 24–37)

## 2011-08-24 LAB — POCT I-STAT 3, ART BLOOD GAS (G3+)
Bicarbonate: 24.5 mEq/L — ABNORMAL HIGH (ref 20.0–24.0)
Patient temperature: 98
pH, Arterial: 7.427 (ref 7.350–7.450)

## 2011-08-24 LAB — SURGICAL PCR SCREEN
MRSA, PCR: NEGATIVE
Staphylococcus aureus: NEGATIVE

## 2011-08-24 LAB — URINE MICROSCOPIC-ADD ON

## 2011-08-24 LAB — CARDIAC PANEL(CRET KIN+CKTOT+MB+TROPI): Relative Index: 10.4 — ABNORMAL HIGH (ref 0.0–2.5)

## 2011-08-24 LAB — TYPE AND SCREEN
ABO/RH(D): B NEG
Antibody Screen: NEGATIVE

## 2011-08-24 LAB — MAGNESIUM: Magnesium: 2.5 mg/dL (ref 1.5–2.5)

## 2011-08-24 SURGERY — INTRA-AORTIC BALLOON PUMP INSERTION
Anesthesia: LOCAL

## 2011-08-24 MED ORDER — POTASSIUM CHLORIDE 2 MEQ/ML IV SOLN
80.0000 meq | INTRAVENOUS | Status: DC
  Filled 2011-08-24: qty 40

## 2011-08-24 MED ORDER — VANCOMYCIN HCL 1000 MG IV SOLR
1250.0000 mg | INTRAVENOUS | Status: AC
  Administered 2011-08-25: 1250 mg via INTRAVENOUS
  Filled 2011-08-24: qty 1250

## 2011-08-24 MED ORDER — TRANEXAMIC ACID (OHS) BOLUS VIA INFUSION
15.0000 mg/kg | INTRAVENOUS | Status: AC
  Administered 2011-08-25: 1183.5 mg via INTRAVENOUS
  Filled 2011-08-24: qty 1184

## 2011-08-24 MED ORDER — DEXMEDETOMIDINE HCL IN NACL 400 MCG/100ML IV SOLN
0.1000 ug/kg/h | INTRAVENOUS | Status: AC
  Administered 2011-08-25: .2 ug/kg/h via INTRAVENOUS
  Filled 2011-08-24: qty 100

## 2011-08-24 MED ORDER — SODIUM BICARBONATE 8.4 % IV SOLN
INTRAVENOUS | Status: AC
  Administered 2011-08-25: 10:00:00
  Filled 2011-08-24: qty 2.5

## 2011-08-24 MED ORDER — LIDOCAINE HCL (PF) 1 % IJ SOLN
INTRAMUSCULAR | Status: AC
  Filled 2011-08-24: qty 30

## 2011-08-24 MED ORDER — HEPARIN BOLUS VIA INFUSION
4000.0000 [IU] | Freq: Once | INTRAVENOUS | Status: AC
  Administered 2011-08-24: 4000 [IU] via INTRAVENOUS
  Filled 2011-08-24: qty 4000

## 2011-08-24 MED ORDER — SODIUM CHLORIDE 0.9 % IV SOLN
INTRAVENOUS | Status: AC
  Administered 2011-08-25: 1.7 [IU]/h via INTRAVENOUS
  Filled 2011-08-24: qty 1

## 2011-08-24 MED ORDER — TRANEXAMIC ACID (OHS) PUMP PRIME SOLUTION
2.0000 mg/kg | INTRAVENOUS | Status: DC
  Filled 2011-08-24: qty 1.58

## 2011-08-24 MED ORDER — CHLORHEXIDINE GLUCONATE 4 % EX LIQD
60.0000 mL | Freq: Once | CUTANEOUS | Status: AC
  Administered 2011-08-24 – 2011-08-25 (×2): 4 via TOPICAL
  Filled 2011-08-24: qty 60

## 2011-08-24 MED ORDER — PHENYLEPHRINE HCL 10 MG/ML IJ SOLN
30.0000 ug/min | INTRAVENOUS | Status: DC
  Filled 2011-08-24: qty 2

## 2011-08-24 MED ORDER — TRANEXAMIC ACID 100 MG/ML IV SOLN
1.5000 mg/kg/h | INTRAVENOUS | Status: AC
  Administered 2011-08-25: 1.5 mg/kg/h via INTRAVENOUS
  Filled 2011-08-24: qty 25

## 2011-08-24 MED ORDER — METOPROLOL TARTRATE 12.5 MG HALF TABLET
12.5000 mg | ORAL_TABLET | Freq: Once | ORAL | Status: DC
  Filled 2011-08-24: qty 1

## 2011-08-24 MED ORDER — SODIUM CHLORIDE 0.9 % IV SOLN
INTRAVENOUS | Status: DC | PRN
  Administered 2011-08-24: 14:00:00 via INTRAVENOUS

## 2011-08-24 MED ORDER — EPINEPHRINE HCL 1 MG/ML IJ SOLN
0.5000 ug/min | INTRAVENOUS | Status: DC
  Filled 2011-08-24: qty 4

## 2011-08-24 MED ORDER — NITROGLYCERIN IN D5W 200-5 MCG/ML-% IV SOLN
2.0000 ug/min | INTRAVENOUS | Status: AC
  Administered 2011-08-25: 10 ug/min via INTRAVENOUS
  Filled 2011-08-24: qty 250

## 2011-08-24 MED ORDER — HEPARIN (PORCINE) IN NACL 2-0.9 UNIT/ML-% IJ SOLN
INTRAMUSCULAR | Status: AC
  Filled 2011-08-24: qty 1000

## 2011-08-24 MED ORDER — MOXIFLOXACIN HCL IN NACL 400 MG/250ML IV SOLN
400.0000 mg | INTRAVENOUS | Status: DC
  Filled 2011-08-24: qty 250

## 2011-08-24 MED ORDER — DOPAMINE-DEXTROSE 3.2-5 MG/ML-% IV SOLN
2.0000 ug/kg/min | INTRAVENOUS | Status: DC
  Filled 2011-08-24: qty 250

## 2011-08-24 MED ORDER — MAGNESIUM SULFATE 50 % IJ SOLN
40.0000 meq | INTRAMUSCULAR | Status: DC
  Filled 2011-08-24: qty 10

## 2011-08-24 MED ORDER — HEPARIN (PORCINE) IN NACL 100-0.45 UNIT/ML-% IJ SOLN
1000.0000 [IU]/h | INTRAMUSCULAR | Status: DC
  Administered 2011-08-24 (×2): 1000 [IU]/h via INTRAVENOUS
  Filled 2011-08-24 (×5): qty 250

## 2011-08-24 NOTE — Progress Notes (Signed)
Patient became unresponsive with agonal respirations while being placed on bedpan. Code called.  Respirations supported briefly with ambu bag @ 100% oxygen. Rhythm on monitor SB. Spontaneous respirations returned in less than one minute. Doppler pulse present throughout.  Code called off and responders departed.  Patient again had decreased LOC for about 30 seconds, but quickly returned to baseline without any intervention.

## 2011-08-24 NOTE — Interval H&P Note (Signed)
History and Physical Interval Note:  08/24/2011 1:43 AM  Gabriel Marsh  has presented today for surgery, with the diagnosis of CAD  The various methods of treatment have been discussed with the patient and family. After consideration of risks, benefits and other options for treatment, the patient has consented to  Procedure(s) (LRB): INTRA-AORTIC BALLOON PUMP INSERTION (N/A) as a surgical intervention .  The patient's history has been reviewed, patient examined, no change in status, stable for surgery.  I have reviewed the patient's chart and labs.  Questions were answered to the patient's satisfaction.     Theron Arista [facility] 08/24/2011 1:43 AM

## 2011-08-24 NOTE — Progress Notes (Signed)
Patient ID: Gabriel Marsh, male   DOB: 06/12/1930, 78 y.o.   MRN: [id number] [facility] DAILY PROGRESS NOTE                   [address]            Gabriel Marsh [address]          [phone]        Procedure(s) (LRB): CORONARY ARTERY BYPASS GRAFTING (CABG) (N/A) AORTIC VALVE REPLACEMENT (AVR) (N/A)  Total Length of Stay:  LOS: 1 day   Subjective: Events of last night noted  Objective: Vital signs in last 24 hours: Temp:  [97.2 F (36.2 C)-98.5 F (36.9 C)] 97.9 F (36.6 C) (07/31 1600) Pulse Rate:  [49-197] 92  (07/31 1600) Cardiac Rhythm:  [-] Normal sinus rhythm (07/31 1600) Resp:  [16-26] 20  (07/31 1600) BP: (63-120)/(25-85) 112/56 mmHg (07/31 1600) SpO2:  [91 %-100 %] 95 % (07/31 1600) FiO2 (%):  [100 %] 100 % (07/31 0100) Weight:  [173 lb 15.1 oz (78.9 kg)] 173 lb 15.1 oz (78.9 kg) (07/31 0100)  Filed Weights   08/23/11 1500 08/24/11 0100  Weight: 174 lb 9.7 oz (79.2 kg) 173 lb 15.1 oz (78.9 kg)    Weight change:    Hemodynamic parameters for last 24 hours:    Intake/Output from previous day: 07/30 0701 - 07/31 0700 In: 1508.7 [P.O.:480; I.V.:766.7; IV Piggyback:262] Out: 250 [Urine:250]  Intake/Output this shift: Total I/O In: 791.4 [P.O.:450; I.V.:341.4] Out: 395 [Urine:395]  Current Meds: Scheduled Meds:   . aspirin  81 mg Oral Daily  . heparin      . heparin  4,000 Units Intravenous Once  . lidocaine      .  morphine injection  1 mg Intravenous Once  . simvastatin  5 mg Oral q1800  . sodium chloride  250 mL Intravenous Once  . sodium chloride  3 mL Intravenous Q12H  . DISCONTD: aspirin  81 mg Oral Daily  . DISCONTD: carvedilol  3.125 mg Oral BID WC  . DISCONTD: heparin  5,000 Units Subcutaneous Q8H  . DISCONTD: irbesartan  75 mg Oral Daily   Continuous Infusions:   . sodium chloride 20 mL/hr at 08/24/11 1330  . DOPamine 2.5 mcg/kg/min (08/24/11 1420)  . heparin 1,000 Units/hr (08/24/11 0730)   PRN Meds:.sodium chloride,  sodium chloride, acetaminophen, ALPRAZolam, ondansetron (ZOFRAN) IV, sodium chloride, zolpidem, DISCONTD: nitroGLYCERIN  General appearance: alert, cooperative and no distress Neurologic: intact Heart: regular rate and rhythm and systolic murmur: holosystolic 3/6, blowing at apex Lungs: clear to auscultation bilaterally Abdomen: soft, non-tender; bowel sounds normal; no masses,  no organomegaly Extremities: extremities normal, atraumatic, no cyanosis or edema and Homans sign is negative, no sign of DVT IAB in rt groin  Lab Results: CBC: Basename 08/24/11 0400 08/23/11 1620  WBC 10.7* 8.2  HGB 12.3* 12.4*  HCT 36.6* 36.7*  PLT 217 242   BMET:  Basename 08/24/11 0400 08/23/11 1620  NA 143 138  K 3.7 3.9  CL 105 102  CO2 26 26  GLUCOSE 134* 101*  BUN 34* 26*  CREATININE 0.94 0.79  CALCIUM 8.9 9.1    PT/INR: No results found for this basename: LABPROT,INR in the last 72 hours Radiology: Dg Chest Portable 1 View  08/24/2011  *RADIOLOGY REPORT*  Clinical Data: Confirm central line placement  PORTABLE CHEST - 1 VIEW  Comparison: Prior radiographs obtained earlier today, 08/24/2011 at 07:33 a.m.  Findings: Interval placement of a right IJ  approach central venous catheter.  The catheter tip projects over the mid superior vena cava.  Additionally, the metallic marker of an intra-aortic balloon pump appears to have been advanced.  He is now 3 cm below the top of the aortic arch compared to 4.7 cm on the prior chest x-ray.  Improving pulmonary edema with decreased interstitial and airspace opacities.  Cardiomegaly appears unchanged.  IMPRESSION:  1.  Interval placement of a right IJ approach central venous catheter.  The catheter tip projects over the mid SVC.  No evidence of complicating pneumothorax. 2.  Improving pulmonary edema. 3.  Unchanged cardiomegaly 4.  The intra-aortic balloon pump appears to have advanced slightly.  The tip is now 3 cm below the top of the aortic arch compared to 4.7  cm on the prior study.  Original Report Authenticated By: Alvino Blood Chest Port 1 View  08/24/2011  *RADIOLOGY REPORT*  Clinical Data: Preoperative examination (AVR); intra-aortic balloon pump evaluation.  PORTABLE CHEST - 1 VIEW  Comparison: None.  Findings: Enlarged cardiac silhouette and mediastinal contours. The tip of an intra-aortic balloon pump is approximately 4.7 cm from the superior aspect of the aortic arch. Pulmonary vascular is indistinct with cephalization of flow.  Bilateral infrahilar heterogeneous and left basilar consolidative opacities.  There is blunting of bilateral costophrenic angles suggesting small effusions.  No definite pneumothorax.  No definite acute osseous abnormalities.  IMPRESSION: 1.  Enlarged cardiac silhouette and mediastinal contours with findings suggestive of pulmonary edema and small bilateral effusions. 2.  Tip of IABP approximately 4.7 cm from the superior aspect of the aortic arch. 3.  Bilateral infrahilar and left basilar/retrocardiac opacities, atelectasis versus infiltrate.  Original Report Authenticated By: Waynard Reeds, M.D.     Assessment/Plan:  Plan surgery in am S/P Procedure(s) (LRB): CORONARY ARTERY BYPASS GRAFTING (CABG) (N/A) AORTIC VALVE REPLACEMENT (AVR) (N/A) The goals risks and alternatives of the planned surgical procedure CABG and AVR have been discussed with the patient in detail. The risks of the procedure including death, infection, stroke, myocardial infarction, bleeding, blood transfusion possible need for pacer placement have all been discussed specifically. I have quoted Gabriel Marsh a 5 % of perioperative mortality and a complication rate as high as 20 %. The patient's questions have been answered.Gabriel Marsh is willing to proceed with the planned procedure.  Delight Ovens MD      Delight Ovens MD  Beeper [phone] Office [phone] 08/24/2011 4:28 PM

## 2011-08-24 NOTE — Progress Notes (Signed)
ANTICOAGULATION CONSULT NOTE - Follow Up Consult  Pharmacy Consult for heparin Indication: chest pain/ACS  Allergies  Allergen Reactions  . Fish Allergy   . Lisinopril     cough  . Penicillins     Hives   . Statins     Leg cramps  . Sulfa Antibiotics     Hives     Patient Measurements: Height: 5\' 10"  (177.8 cm) Weight: 173 lb 15.1 oz (78.9 kg) IBW/kg (Calculated) : 73   Vital Signs: Temp: 98.5 F (36.9 C) (07/31 0730) Temp src: Oral (07/31 0730) BP: 104/51 mmHg (07/31 0900) Pulse Rate: 114  (07/31 0800)  Labs:  Basename 08/24/11 0818 08/24/11 0415 08/24/11 0400 08/23/11 2122 08/23/11 1620  HGB -- -- 12.3* -- 12.4*  HCT -- -- 36.6* -- 36.7*  PLT -- -- 217 -- 242  APTT -- -- -- -- --  LABPROT -- -- -- -- --  INR -- -- -- -- --  HEPARINUNFRC 0.21* -- -- -- --  CREATININE -- -- 0.94 -- 0.79  CKTOTAL -- 428* -- 98 112  CKMB -- 44.5* -- 7.2* 8.8*  TROPONINI -- 9.61* -- 1.51* 1.84*    Estimated Creatinine Clearance: 65.8 ml/min (by C-G formula based on Cr of 0.94).   Medications:  Scheduled:    . aspirin  81 mg Oral Daily  . heparin      . heparin  4,000 Units Intravenous Once  . irbesartan  75 mg Oral Daily  . lidocaine      .  morphine injection  1 mg Intravenous Once  . simvastatin  5 mg Oral q1800  . sodium chloride  250 mL Intravenous Once  . sodium chloride  3 mL Intravenous Q12H  . DISCONTD: aspirin  81 mg Oral Daily  . DISCONTD: carvedilol  3.125 mg Oral BID WC  . DISCONTD: heparin  5,000 Units Subcutaneous Q8H    Assessment: 78 yo male w/ NSTEMI on heparin with plans for CABG and AVR. Patient noted with persistent hypotension and IABP placed this am.  The initial heparin level is 0.21 with Hg=12.3 and platelets=217.  Goal of Therapy:  Heparin level=0.2-0.5 Monitor platelets by anticoagulation protocol: Yes   Plan:  -No heparin changes needed -Will repeat heparin level later today  Harland German, Pharm D 08/24/2011 9:46 AM

## 2011-08-24 NOTE — Procedures (Signed)
Central Venous Catheter Insertion Procedure Note Gabriel Marsh [id number] April 25, 1930  Procedure: Insertion of Central Venous Catheter Indications: Assessment of intravascular volume and Drug and/or fluid administration  Procedure Details Consent: Risks of procedure as well as the alternatives and risks of each were explained to the (patient/caregiver).  Consent for procedure obtained. Time Out: Verified patient identification, verified procedure, site/side was marked, verified correct patient position, special equipment/implants available, medications/allergies/relevent history reviewed, required imaging and test results available.  Performed  Maximum sterile technique was used including antiseptics, cap, gloves, gown, hand hygiene, mask and sheet. Skin prep: Chlorhexidine; local anesthetic administered A antimicrobial bonded/coated triple lumen catheter was placed in the right internal jugular vein using the Seldinger technique.  Evaluation Blood flow good Complications: No apparent complications Patient did tolerate procedure well. Chest X-ray ordered to verify placement.  CXR: pending.  Performed using ultrasound guidance. Placed by Anders Simmonds ACNP 08/24/2011 11:41 AM

## 2011-08-24 NOTE — CV Procedure (Signed)
  Intra-aortic balloon pump placement   Indication: 78 year old white male with severe three-vessel and left main coronary disease, congestive heart failure with ejection fraction of 35%, and severe aortic stenosis. He is status post brief cardiac arrest with asystole. He has persistent hypotension despite pressor support. He also complains of right shoulder pain which is his anginal symptom. Intra-aortic balloon pump was indicated to stabilize his hemodynamics pending surgery.  Procedure: The patient was prepped and draped in a sterile fashion. His right groin was anesthetized with 1% lidocaine. Using a modified Seldinger technique the right femoral artery was accessed. An intra-aortic balloon pump sheath was placed without difficulty. The balloon pump was then positioned under fluoroscopic guidance. There were no complications. Patient tolerated procedure well and was transferred back to the coronary intensive care unit for continued management.  Final assessment: Successful placement of intra-aortic balloon pump.  Sharnita Bogucki [place] MD, [facility]  08/24/2011 2:10 AM

## 2011-08-24 NOTE — Progress Notes (Signed)
TELEMETRY: Reviewed telemetry pt in sinus tachycardia with occ PVCs, 4 beat run of NSVT: Filed Vitals:   08/24/11 0730 08/24/11 0800 08/24/11 0830 08/24/11 0900  BP: 83/71 95/65 100/71 104/51  Pulse: 111 114    Temp: 98.5 F (36.9 C)     TempSrc: Oral     Resp: 21 20    Height:      Weight:      SpO2: 96% 98%      Intake/Output Summary (Last 24 hours) at 08/24/11 0939 Last data filed at 08/24/11 0800  Gross per 24 hour  Intake 1542.86 ml  Output    250 ml  Net 1292.86 ml    SUBJECTIVE Feels better this am. Only very vague right shoulder discomfort. No SOB. Feels queasy on stomach.   LABS: Basic Metabolic Panel:  Basename 08/24/11 0400 08/23/11 1620  NA 143 138  K 3.7 3.9  CL 105 102  CO2 26 26  GLUCOSE 134* 101*  BUN 34* 26*  CREATININE 0.94 0.79  CALCIUM 8.9 9.1  MG 2.5 --  PHOS -- --   CBC:  Basename 08/24/11 0400 08/23/11 1620  WBC 10.7* 8.2  NEUTROABS -- --  HGB 12.3* 12.4*  HCT 36.6* 36.7*  MCV 90.8 89.3  PLT 217 242   Cardiac Enzymes:  Basename 08/24/11 0415 08/23/11 2122 08/23/11 1620  CKTOTAL 428* 98 112  CKMB 44.5* 7.2* 8.8*  CKMBINDEX -- -- --  TROPONINI 9.61* 1.51* 1.84*    Radiology/Studies:  Dg Chest Port 1 View  08/24/2011  *RADIOLOGY REPORT*  Clinical Data: Preoperative examination (AVR); intra-aortic balloon pump evaluation.  PORTABLE CHEST - 1 VIEW  Comparison: None.  Findings: Enlarged cardiac silhouette and mediastinal contours. The tip of an intra-aortic balloon pump is approximately 4.7 cm from the superior aspect of the aortic arch. Pulmonary vascular is indistinct with cephalization of flow.  Bilateral infrahilar heterogeneous and left basilar consolidative opacities.  There is blunting of bilateral costophrenic angles suggesting small effusions.  No definite pneumothorax.  No definite acute osseous abnormalities.  IMPRESSION: 1.  Enlarged cardiac silhouette and mediastinal contours with findings suggestive of pulmonary edema and  small bilateral effusions. 2.  Tip of IABP approximately 4.7 cm from the superior aspect of the aortic arch. 3.  Bilateral infrahilar and left basilar/retrocardiac opacities, atelectasis versus infiltrate.  Original Report Authenticated By: Waynard Reeds, M.D.    PHYSICAL EXAM General: elderly, well nourished, in no acute distress. Head: Normocephalic, atraumatic, sclera non-icteric, no xanthomas, nares are without discharge. Neck: Negative for carotid bruits. JVD not elevated. Lungs: Clear bilaterally to auscultation anteriorly without wheezes, rales, or rhonchi. Breathing is unlabored. Heart: RRR S1 S2 with grade 2-3/6 harsh systolic murmur RUSB to apex.  Abdomen: Soft, non-tender, non-distended with normoactive bowel sounds. No hepatomegaly. No rebound/guarding. No obvious abdominal masses. Extremities: No clubbing, cyanosis or edema.  Distal pedal pulses are 2+ and equal bilaterally. IABP located in right groin without hematoma. Neuro: Alert and oriented X 3. Moves all extremities spontaneously. Psych:  Responds to questions appropriately with a normal affect.  ASSESSMENT AND PLAN: 1. NSTEMI. Patient with critical CAD- occluded RCA and LCX. 99% large intermediate branch. 50-70% left main. Recurrent pain last night relieved with IABP. Cardiac enzymes increased with event. Now improved. Less ST depression in lateral leads. Awaiting CABG/AVR. Continue heparin IV and ASA. Continue IABP. Other antianginal therapy limited by low BP.   2. S/p agonal cardiac arrest. Brief resuscitation with persistent profound hypotension. Requiring IABP  and pressors to stabilize BP. IABP advanced approx. 3 cm based on CXR findings. Aorta  tortuous.   3. Severe AS with moderate AI and MR.   4. Cardiogenic shock with hypotension/ mild pulmonary edema in setting of severe AS, critical CAD, and LV dysfunction with EF 35%. Continue pressor support and IABP. Hold diuretics with low BP. Oxygenation is good.  5.  Hyperlipidemia. Continue statin.     Principal Problem:  *Severe aortic stenosis Active Problems:  CAD (coronary artery disease), native coronary artery  Hypertension  Hyperlipidemia  Chronic systolic CHF (congestive heart failure)  Moderate mitral regurgitation  Moderate aortic insufficiency  Cardiogenic shock  NSTEMI (non-ST elevated myocardial infarction)    Signed, Cobie Marcoux [place] MD,FACC 08/24/2011 9:39 AM

## 2011-08-24 NOTE — Progress Notes (Signed)
ANTICOAGULATION CONSULT NOTE - Initial Consult  Pharmacy Consult for Heparin   Indication: chest pain/ACS  Allergies  Allergen Reactions  . Fish Allergy   . Lisinopril     cough  . Penicillins     Hives   . Statins     Leg cramps  . Sulfa Antibiotics     Hives     Patient Measurements: Height: 5\' 10"  (177.8 cm) Weight: 173 lb 15.1 oz (78.9 kg) IBW/kg (Calculated) : 73   Vital Signs: Temp: 97.2 F (36.2 C) (07/30 2343) Temp src: Oral (07/30 2343) BP: 84/57 mmHg (07/31 0100) Pulse Rate: 96  (07/31 0050)  Labs:  Basename 08/23/11 2122 08/23/11 1620  HGB -- 12.4*  HCT -- 36.7*  PLT -- 242  APTT -- --  LABPROT -- --  INR -- --  HEPARINUNFRC -- --  CREATININE -- 0.79  CKTOTAL 98 112  CKMB 7.2* 8.8*  TROPONINI 1.51* 1.84*    Estimated Creatinine Clearance: 77.3 ml/min (by C-G formula based on Cr of 0.79).   Medical History: Past Medical History  Diagnosis Date  . Hypertension   . Hyperlipidemia   . Mitral regurgitation     Moderate by 08/20/11 echo  . Shortness of breath   . CAD (coronary artery disease)     a) 08/22/11 cath :  50-60% left main stenosis, mild-moderate LAD disease, 99% ostial large diagonal stenosis, occluded and collateralized LCx and RCA  . Aortic stenosis     Severe by 08/20/11 echo  . Aortic insufficiency     Moderate by 08/20/11 echo  . Chronic systolic CHF (congestive heart failure)     a) 08/20/11 echo : LVEF 35-40%    Medications:  Prescriptions prior to admission  Medication Sig Dispense Refill  . aspirin 81 MG tablet Take 160 mg by mouth daily.      Marland Kitchen DISCONTD: Cholecalciferol (VITAMIN D PO) Take 2,000 Int'l Units by mouth daily.      Marland Kitchen DISCONTD: valsartan-hydrochlorothiazide (DIOVAN-HCT) 80-12.5 MG per tablet Take 1 tablet by mouth daily.        Assessment: 78 yo male with chest pain, CAD awaiting CABG, for Heparin  Goal of Therapy:  Heparin level 0.3-0.7 units/ml Monitor platelets by anticoagulation protocol: Yes   Plan:  Heparin 4000 units IV bolus, then 1000 units/hr Check heparin level in 6 hours.  Mauricio Dahlen, Gary Fleet 08/24/2011,1:12 AM

## 2011-08-24 NOTE — H&P (View-Only) (Signed)
Patient ID: Gabriel Marsh, male   DOB: Feb 15, 1930, 78 y.o.   MRN: [id number]   Cardiology note  At 7 pm, pt hypotensive, so started on dopamine and given fluids by prior shift.  BP improved on dopamine, but at 11pm, pt with acute onset "severe" sharp chest pain and R shoulder pain, +nausea, +SOB.  Pt was given tylenol and zofran by RN.  EKG obtained showing 3 mm ST dep V3-6.  Morphine 1 mg IV ordered and given.  Avoided nitro given severe AS - this was dc'd.  He got nauseous after morphine and vomited once.  Within 30 mins, pt tried to have BM on bedpan - his HR dropped and he brady'd down and then went unresponsive with agonal breathing.  Pulse was lost for a few seconds.  He was bagged.  Whole episode lasted a few mins and then pt came to.  He did not require CPR.  He is alert and oriented at this time.  He continues to have R shoulder pain, but it's "not as bad."  He reports no SOB.  Lungs with scattered rhonchi.  Called critical care consult for central line and pt may need intubation at some point - currently on 100% NRB.  Discussed case with CT surgery on call they recommended repeat echo and IABP, heparin. Called in cath lab for IABP.  Pt updated on plan.  CC time 30-45 mins

## 2011-08-24 NOTE — Progress Notes (Signed)
ANTICOAGULATION CONSULT NOTE - Follow Up Consult  Pharmacy Consult for heparin Indication: chest pain/ACS with IABP  Allergies  Allergen Reactions  . Fish Allergy   . Lisinopril     cough  . Penicillins     Hives   . Statins     Leg cramps  . Sulfa Antibiotics     Hives     Patient Measurements: Height: 5\' 10"  (177.8 cm) Weight: 173 lb 15.1 oz (78.9 kg) IBW/kg (Calculated) : 73   Vital Signs: Temp: 97.9 F (36.6 C) (07/31 1600) Temp src: Oral (07/31 1600) BP: 112/56 mmHg (07/31 1600) Pulse Rate: 92  (07/31 1600)  Labs:  Basename 08/24/11 1600 08/24/11 0818 08/24/11 0415 08/24/11 0400 08/23/11 2122 08/23/11 1620  HGB 11.6* -- -- 12.3* -- --  HCT 34.9* -- -- 36.6* -- 36.7*  PLT 207 -- -- 217 -- 242  APTT -- -- -- -- -- --  LABPROT -- -- -- -- -- --  INR -- -- -- -- -- --  HEPARINUNFRC 0.36 0.21* -- -- -- --  CREATININE -- -- -- 0.94 -- 0.79  CKTOTAL -- -- 428* -- 98 112  CKMB -- -- 44.5* -- 7.2* 8.8*  TROPONINI -- -- 9.61* -- 1.51* 1.84*    Estimated Creatinine Clearance: 65.8 ml/min (by C-G formula based on Cr of 0.94).   Medications:  Scheduled:     . aspirin  81 mg Oral Daily  . chlorhexidine  60 mL Topical Once  . dexmedetomidine  0.1-0.7 mcg/kg/hr Intravenous To OR  . DOPamine  2-20 mcg/kg/min Intravenous To OR  . epinephrine  0.5-20 mcg/min Intravenous To OR  . heparin      . heparin  4,000 Units Intravenous Once  . insulin (NOVOLIN-R) infusion   Intravenous To OR  . lidocaine      . magnesium sulfate  40 mEq Other To OR  . metoprolol tartrate  12.5 mg Oral Once  .  morphine injection  1 mg Intravenous Once  . moxifloxacin  400 mg Intravenous To OR  . nitroGLYCERIN  2-200 mcg/min Intravenous To OR  . nitroglycerin-nicardipine-HEPARIN-sodium bicarbonate irrigation for artery spasm   Irrigation To OR  . phenylephrine (NEO-SYNEPHRINE) Adult infusion  30-200 mcg/min Intravenous To OR  . potassium chloride  80 mEq Other To OR  . simvastatin  5 mg  Oral q1800  . sodium chloride  250 mL Intravenous Once  . sodium chloride  3 mL Intravenous Q12H  . tranexamic acid  15 mg/kg Intravenous To OR  . tranexamic acid  2 mg/kg Intracatheter To OR  . tranexamic acid (CYKLOKAPRON) infusion ([facility])  1.5 mg/kg/hr Intravenous To OR  . vancomycin  1,250 mg Intravenous To OR  . DISCONTD: carvedilol  3.125 mg Oral BID WC  . DISCONTD: heparin  5,000 Units Subcutaneous Q8H  . DISCONTD: irbesartan  75 mg Oral Daily    Assessment: 78 yo male w/ NSTEMI on heparin with plans for CABG and AVR. Patient noted with persistent hypotension and IABP placed this am.   Repeat heparin level is at goal with no complications noted.  Goal of Therapy:  Heparin level=0.2-0.5 Monitor platelets by anticoagulation protocol: Yes   Plan:  -No heparin changes needed -Continue daily heparin level and CBC  Sincerity Cedar L. Illene Bolus, PharmD, BCPS Clinical Pharmacist Pager: [phone] Pharmacy: [phone] 08/24/2011 5:45 PM

## 2011-08-24 NOTE — Progress Notes (Addendum)
VASCULAR LAB PRELIMINARY  PRELIMINARY  PRELIMINARY  PRELIMINARY  Pre-op Cardiac Surgery  Carotid Findings:  Patient on balloon pump.  Mild irregular mixed plaque.  No ICA stenosis.  Right vert and proximal CCA not imaged due to central line.  Left vertebral antegrade.  Upper Extremity Right Left  Brachial Pressures 109   106  Radial Waveforms    Ulnar Waveforms    Palmar Arch (Allen's Test) * *   Findings:  Doppler waveforms remain normal with ulnar and radial compressions bilaterally.    Lower  Extremity Right Left  Dorsalis Pedis    Anterior Tibial    Posterior Tibial    Ankle/Brachial Indices      Findings:  Palpable pedal pulses x 4.     CESTONE, HELENE, RVT 08/24/2011, 3:50 PM

## 2011-08-24 NOTE — Plan of Care (Signed)
Problem: Phase I Progression Outcomes Goal: Point person for discharge identified Outcome: Completed/Met Date Met:  08/24/11 [name] (niece)

## 2011-08-24 NOTE — Progress Notes (Signed)
Pt blood pressure (BP) trending lower than normal starting at 1900. NP on-call for [facility] cardiology Ward Givens) notified. Order for 250 NS bolus received and carried out with no improvements in BP. Called Hayden back and received order for Dopamine infusion. Pt BP started improving while titrating Dopamine. Around 2300, Pt complained of sudden pain that awakened him from sleep in his right shoulder along with pain radiating toward the chest, nausea with no vomiting, and was diaphoretic. Tylenol and Zofran administered, and EKG performed. EKG showed worsening ST depression on multiple leads compared to the one performed at 1630 this afternoon. MD on-call for [facility] Cardiology (Dr. Hilary Hertz) notified of pt's symptoms. Order for 1mg  Morphine received and carried out at approximately 0000. NTG avoided due to pt's unstable BP. Pt had nausea for a second time along with a small amount of green liquid emesis right after Morphine administered. Approximately 15 minutes later while being assisted to use bedpan, pt became SOB and bradycardic, and momentarily unresponsive. Code pulled and pt bagged. Within a very short period of time the pt was aroused and was A&O. No CPR or medications given. MD arrived and planned for imminent central line and balloon pump placement.

## 2011-08-24 NOTE — Progress Notes (Signed)
Patient ID: Gabriel Marsh, male   DOB: 05/16/1930, 78 y.o.   MRN: [id number]   Cardiology note  At 7 pm, pt hypotensive, so started on dopamine and given fluids by prior shift.  BP improved on dopamine, but at 11pm, pt with acute onset "severe" sharp chest pain and R shoulder pain, +nausea, +SOB.  Pt was given tylenol and zofran by RN.  EKG obtained showing 3 mm ST dep V3-6.  Morphine 1 mg IV ordered and given.  Avoided nitro given severe AS - this was dc'd.  He got nauseous after morphine and vomited once.  Within 30 mins, pt tried to have BM on bedpan - his HR dropped and he brady'd down and then went unresponsive with agonal breathing.  Pulse was lost for a few seconds.  He was bagged.  Whole episode lasted a few mins and then pt came to.  He did not require CPR.  He is alert and oriented at this time.  He continues to have R shoulder pain, but it's "not as bad."  He reports no SOB.  Lungs with scattered rhonchi.  Called critical care consult for central line and pt may need intubation at some point - currently on 100% NRB.  Discussed case with CT surgery on call they recommended repeat echo and IABP, heparin. Called in cath lab for IABP.  Pt updated on plan.  CC time 30-45 mins 

## 2011-08-25 ENCOUNTER — Encounter (HOSPITAL_COMMUNITY): Payer: Self-pay | Admitting: Anesthesiology

## 2011-08-25 ENCOUNTER — Inpatient Hospital Stay (HOSPITAL_COMMUNITY): Payer: Medicare Other

## 2011-08-25 ENCOUNTER — Inpatient Hospital Stay (HOSPITAL_COMMUNITY): Payer: Medicare Other | Admitting: Anesthesiology

## 2011-08-25 ENCOUNTER — Encounter (HOSPITAL_COMMUNITY)
Admission: AD | Disposition: A | Discharge status: Rehabilitation Facility | Payer: Self-pay | Source: Other Acute Inpatient Hospital | Attending: Cardiothoracic Surgery

## 2011-08-25 DIAGNOSIS — I2510 Atherosclerotic heart disease of native coronary artery without angina pectoris: Secondary | ICD-10-CM

## 2011-08-25 DIAGNOSIS — I359 Nonrheumatic aortic valve disorder, unspecified: Secondary | ICD-10-CM

## 2011-08-25 HISTORY — PX: CORONARY ARTERY BYPASS GRAFT: SHX141

## 2011-08-25 HISTORY — PX: AORTIC VALVE REPLACEMENT: SHX41

## 2011-08-25 LAB — CBC
HCT: 33.2 % — ABNORMAL LOW (ref 39.0–52.0)
HCT: 26 % — ABNORMAL LOW (ref 39.0–52.0)
HCT: 30.2 % — ABNORMAL LOW (ref 39.0–52.0)
Hemoglobin: 11.1 g/dL — ABNORMAL LOW (ref 13.0–17.0)
Hemoglobin: 8.7 g/dL — ABNORMAL LOW (ref 13.0–17.0)
Hemoglobin: 10 g/dL — ABNORMAL LOW (ref 13.0–17.0)
MCH: 30.3 pg (ref 26.0–34.0)
MCH: 30.1 pg (ref 26.0–34.0)
MCH: 30 pg (ref 26.0–34.0)
MCHC: 33.4 g/dL (ref 30.0–36.0)
MCHC: 33.5 g/dL (ref 30.0–36.0)
MCHC: 33.1 g/dL (ref 30.0–36.0)
MCV: 90.7 fL (ref 78.0–100.0)
MCV: 90 fL (ref 78.0–100.0)
MCV: 90.7 fL (ref 78.0–100.0)
Platelets: 96 10*3/uL — ABNORMAL LOW (ref 150–400)
Platelets: 134 10*3/uL — ABNORMAL LOW (ref 150–400)
RBC: 3.66 MIL/uL — ABNORMAL LOW (ref 4.22–5.81)
RBC: 2.89 MIL/uL — ABNORMAL LOW (ref 4.22–5.81)
RBC: 3.33 MIL/uL — ABNORMAL LOW (ref 4.22–5.81)
RDW: 13.9 % (ref 11.5–15.5)
RDW: 13.9 % (ref 11.5–15.5)
WBC: 13.4 10*3/uL — ABNORMAL HIGH (ref 4.0–10.5)
WBC: 13.9 10*3/uL — ABNORMAL HIGH (ref 4.0–10.5)

## 2011-08-25 LAB — POCT I-STAT 4, (NA,K, GLUC, HGB,HCT)
Glucose, Bld: 117 mg/dL — ABNORMAL HIGH (ref 70–99)
Glucose, Bld: 107 mg/dL — ABNORMAL HIGH (ref 70–99)
Glucose, Bld: 158 mg/dL — ABNORMAL HIGH (ref 70–99)
Glucose, Bld: 111 mg/dL — ABNORMAL HIGH (ref 70–99)
Glucose, Bld: 67 mg/dL — ABNORMAL LOW (ref 70–99)
HCT: 34 % — ABNORMAL LOW (ref 39.0–52.0)
HCT: 25 % — ABNORMAL LOW (ref 39.0–52.0)
HCT: 22 % — ABNORMAL LOW (ref 39.0–52.0)
Hemoglobin: 11.6 g/dL — ABNORMAL LOW (ref 13.0–17.0)
Hemoglobin: 10.2 g/dL — ABNORMAL LOW (ref 13.0–17.0)
Hemoglobin: 8.5 g/dL — ABNORMAL LOW (ref 13.0–17.0)
Hemoglobin: 8.5 g/dL — ABNORMAL LOW (ref 13.0–17.0)
Potassium: 4 meq/L (ref 3.5–5.1)
Potassium: 3.6 mEq/L (ref 3.5–5.1)
Potassium: 3.5 mEq/L (ref 3.5–5.1)
Potassium: 4.3 mEq/L (ref 3.5–5.1)
Potassium: 3.5 mEq/L (ref 3.5–5.1)
Sodium: 142 meq/L (ref 135–145)
Sodium: 138 mEq/L (ref 135–145)
Sodium: 141 mEq/L (ref 135–145)
Sodium: 139 mEq/L (ref 135–145)

## 2011-08-25 LAB — LIPID PANEL
Cholesterol: 116 mg/dL (ref 0–200)
HDL: 48 mg/dL (ref >39)
Total CHOL/HDL Ratio: 2.4 RATIO
Triglycerides: 48 mg/dL (ref <150)
VLDL: 10 mg/dL (ref 0–40)

## 2011-08-25 LAB — GLUCOSE, CAPILLARY
Glucose-Capillary: 59 mg/dL — ABNORMAL LOW (ref 70–99)
Glucose-Capillary: 64 mg/dL — ABNORMAL LOW (ref 70–99)
Glucose-Capillary: 80 mg/dL (ref 70–99)
Glucose-Capillary: 72 mg/dL (ref 70–99)
Glucose-Capillary: 117 mg/dL — ABNORMAL HIGH (ref 70–99)

## 2011-08-25 LAB — POCT I-STAT 3, ART BLOOD GAS (G3+)
Acid-base deficit: 3 mmol/L — ABNORMAL HIGH (ref 0.0–2.0)
O2 Saturation: 98 %
Patient temperature: 37.4
TCO2: 25 mmol/L (ref 0–100)
pH, Arterial: 7.435 (ref 7.350–7.450)
pO2, Arterial: 100 mmHg (ref 80.0–100.0)

## 2011-08-25 LAB — MAGNESIUM: Magnesium: 3.4 mg/dL — ABNORMAL HIGH (ref 1.5–2.5)

## 2011-08-25 LAB — PLATELET COUNT: Platelets: 137 10*3/uL — ABNORMAL LOW (ref 150–400)

## 2011-08-25 LAB — CREATININE, SERUM
Creatinine, Ser: 0.68 mg/dL (ref 0.50–1.35)
GFR calc Af Amer: >90 mL/min (ref >90)
GFR calc non Af Amer: 88 mL/min — ABNORMAL LOW (ref >90)

## 2011-08-25 LAB — POCT I-STAT, CHEM 8
BUN: 25 mg/dL — ABNORMAL HIGH (ref 6–23)
Chloride: 109 mEq/L (ref 96–112)
Potassium: 4.8 mEq/L (ref 3.5–5.1)
Sodium: 138 mEq/L (ref 135–145)
TCO2: 20 mmol/L (ref 0–100)

## 2011-08-25 LAB — APTT: aPTT: 43 seconds — ABNORMAL HIGH (ref 24–37)

## 2011-08-25 LAB — PROTIME-INR
INR: 1.89 — ABNORMAL HIGH (ref 0.00–1.49)
Prothrombin Time: 22 seconds — ABNORMAL HIGH (ref 11.6–15.2)

## 2011-08-25 LAB — HEMOGLOBIN AND HEMATOCRIT, BLOOD
HCT: 23.4 % — ABNORMAL LOW (ref 39.0–52.0)
Hemoglobin: 7.8 g/dL — ABNORMAL LOW (ref 13.0–17.0)

## 2011-08-25 SURGERY — CORONARY ARTERY BYPASS GRAFTING (CABG)
Anesthesia: General | Site: Chest | Wound class: Contaminated

## 2011-08-25 MED ORDER — DEXTROSE 50 % IV SOLN: 25.0000 mL | Freq: Once | INTRAVENOUS | Status: AC | PRN

## 2011-08-25 MED ORDER — SODIUM CHLORIDE 0.9 % IJ SOLN
3.0000 mL | INTRAMUSCULAR | Status: DC | PRN
  Administered 2011-08-28: 3 mL via INTRAVENOUS

## 2011-08-25 MED ORDER — 0.9 % SODIUM CHLORIDE (POUR BTL) OPTIME
TOPICAL | Status: DC | PRN
  Administered 2011-08-25: 6000 mL

## 2011-08-25 MED ORDER — HEPARIN SODIUM (PORCINE) 1000 UNIT/ML IJ SOLN
INTRAMUSCULAR | Status: AC
  Filled 2011-08-25: qty 1

## 2011-08-25 MED ORDER — BISACODYL 10 MG RE SUPP
10.0000 mg | Freq: Every day | RECTAL | Status: DC
  Administered 2011-08-26: 10 mg via RECTAL
  Filled 2011-08-25: qty 1

## 2011-08-25 MED ORDER — SODIUM CHLORIDE 0.9 % IV SOLN: 250.0000 mL | INTRAVENOUS | Status: DC

## 2011-08-25 MED ORDER — MIDAZOLAM HCL 5 MG/5ML IJ SOLN
INTRAMUSCULAR | Status: DC | PRN
  Administered 2011-08-25: 1 mg via INTRAVENOUS

## 2011-08-25 MED ORDER — ACETAMINOPHEN 160 MG/5ML PO SOLN
975.0000 mg | Freq: Four times a day (QID) | ORAL | Status: AC
  Administered 2011-08-25 – 2011-08-27 (×6): 975 mg
  Filled 2011-08-25 (×4): qty 40.6

## 2011-08-25 MED ORDER — PROTAMINE SULFATE 10 MG/ML IV SOLN
INTRAVENOUS | Status: DC | PRN
  Administered 2011-08-25: 10 mg via INTRAVENOUS
  Administered 2011-08-25: 150 mg via INTRAVENOUS
  Administered 2011-08-25: 140 mg via INTRAVENOUS

## 2011-08-25 MED ORDER — INSULIN REGULAR BOLUS VIA INFUSION
0.0000 [IU] | Freq: Three times a day (TID) | INTRAVENOUS | Status: DC
  Filled 2011-08-25: qty 10

## 2011-08-25 MED ORDER — BISACODYL 5 MG PO TBEC
10.0000 mg | DELAYED_RELEASE_TABLET | Freq: Every day | ORAL | Status: DC
  Administered 2011-08-28 – 2011-08-29 (×2): 10 mg via ORAL
  Filled 2011-08-25 (×3): qty 2

## 2011-08-25 MED ORDER — HEMOSTATIC AGENTS (NO CHARGE) OPTIME
TOPICAL | Status: DC | PRN
  Administered 2011-08-25: 1 via TOPICAL

## 2011-08-25 MED ORDER — ALBUMIN HUMAN 5 % IV SOLN
INTRAVENOUS | Status: DC | PRN
  Administered 2011-08-25 (×3): via INTRAVENOUS

## 2011-08-25 MED ORDER — METOPROLOL TARTRATE 12.5 MG HALF TABLET
12.5000 mg | ORAL_TABLET | Freq: Two times a day (BID) | ORAL | Status: DC
  Administered 2011-08-31: 12.5 mg via ORAL
  Filled 2011-08-25 (×13): qty 1

## 2011-08-25 MED ORDER — SODIUM CHLORIDE 0.9 % IV SOLN
INTRAVENOUS | Status: DC
  Filled 2011-08-25: qty 1

## 2011-08-25 MED ORDER — MAGNESIUM SULFATE 40 MG/ML IJ SOLN
4.0000 g | Freq: Once | INTRAMUSCULAR | Status: AC
  Administered 2011-08-25: 4 g via INTRAVENOUS

## 2011-08-25 MED ORDER — ASPIRIN 81 MG PO CHEW: 324.0000 mg | CHEWABLE_TABLET | Freq: Every day | ORAL | Status: DC

## 2011-08-25 MED ORDER — MIDAZOLAM HCL 2 MG/2ML IJ SOLN: 2.0000 mg | INTRAMUSCULAR | Status: DC | PRN

## 2011-08-25 MED ORDER — MAGNESIUM SULFATE 40 MG/ML IJ SOLN
INTRAMUSCULAR | Status: AC
  Filled 2011-08-25: qty 100

## 2011-08-25 MED ORDER — MILRINONE IN DEXTROSE 200-5 MCG/ML-% IV SOLN
0.1250 ug/kg/min | INTRAVENOUS | Status: DC
  Administered 2011-08-25: 2.5 ug/kg/min via INTRAVENOUS
  Filled 2011-08-25: qty 100

## 2011-08-25 MED ORDER — LACTATED RINGERS IV SOLN
INTRAVENOUS | Status: DC | PRN
  Administered 2011-08-25: 10:00:00 via INTRAVENOUS

## 2011-08-25 MED ORDER — CALCIUM CHLORIDE 10 % IV SOLN
INTRAVENOUS | Status: AC
  Filled 2011-08-25: qty 10

## 2011-08-25 MED ORDER — SODIUM CHLORIDE 0.9 % IV SOLN: INTRAVENOUS | Status: DC

## 2011-08-25 MED ORDER — POTASSIUM CHLORIDE 10 MEQ/50ML IV SOLN: 10.0000 meq | INTRAVENOUS | Status: DC

## 2011-08-25 MED ORDER — ALBUMIN HUMAN 5 % IV SOLN: 250.0000 mL | INTRAVENOUS | Status: DC | PRN

## 2011-08-25 MED ORDER — INSULIN ASPART 100 UNIT/ML ~~LOC~~ SOLN
0.0000 [IU] | SUBCUTANEOUS | Status: AC
  Administered 2011-08-25: 2 [IU] via SUBCUTANEOUS

## 2011-08-25 MED ORDER — OXYCODONE HCL 5 MG PO TABS
5.0000 mg | ORAL_TABLET | ORAL | Status: DC | PRN
  Filled 2011-08-25: qty 1

## 2011-08-25 MED ORDER — NITROGLYCERIN IN D5W 200-5 MCG/ML-% IV SOLN: 0.0000 ug/min | INTRAVENOUS | Status: DC

## 2011-08-25 MED ORDER — ASPIRIN EC 325 MG PO TBEC
325.0000 mg | DELAYED_RELEASE_TABLET | Freq: Every day | ORAL | Status: DC
  Administered 2011-08-28 – 2011-08-31 (×4): 325 mg via ORAL
  Filled 2011-08-25 (×6): qty 1

## 2011-08-25 MED ORDER — PHENYLEPHRINE HCL 10 MG/ML IJ SOLN
0.0000 ug/min | INTRAVENOUS | Status: DC
  Administered 2011-08-25: 90 ug/min via INTRAVENOUS
  Administered 2011-08-25: 70 ug/min via INTRAVENOUS
  Administered 2011-08-26: 90 ug/min via INTRAVENOUS
  Administered 2011-08-26: 60 ug/min via INTRAVENOUS
  Administered 2011-08-26: 90 ug/min via INTRAVENOUS
  Administered 2011-08-27: 55 ug/min via INTRAVENOUS
  Administered 2011-08-27: 30 ug/min via INTRAVENOUS
  Administered 2011-08-28: 10 ug/min via INTRAVENOUS
  Filled 2011-08-25 (×12): qty 2

## 2011-08-25 MED ORDER — MOXIFLOXACIN HCL IN NACL 400 MG/250ML IV SOLN
400.0000 mg | Freq: Once | INTRAVENOUS | Status: AC
  Administered 2011-08-26: 400 mg via INTRAVENOUS
  Filled 2011-08-25: qty 250

## 2011-08-25 MED ORDER — SODIUM CHLORIDE 0.45 % IV SOLN: INTRAVENOUS | Status: DC

## 2011-08-25 MED ORDER — PHENYLEPHRINE HCL 10 MG/ML IJ SOLN: 0.0000 ug/min | INTRAVENOUS | Status: DC

## 2011-08-25 MED ORDER — LACTATED RINGERS IV SOLN: 500.0000 mL | Freq: Once | INTRAVENOUS | Status: AC | PRN

## 2011-08-25 MED ORDER — DEXTROSE 5 % IV SOLN: 1.5000 g | Freq: Two times a day (BID) | INTRAVENOUS | Status: DC

## 2011-08-25 MED ORDER — MORPHINE SULFATE 2 MG/ML IJ SOLN: 1.0000 mg | INTRAMUSCULAR | Status: DC | PRN

## 2011-08-25 MED ORDER — FENTANYL CITRATE 0.05 MG/ML IJ SOLN
INTRAMUSCULAR | Status: DC | PRN
  Administered 2011-08-25: 100 ug via INTRAVENOUS
  Administered 2011-08-25: 200 ug via INTRAVENOUS
  Administered 2011-08-25: 250 ug via INTRAVENOUS
  Administered 2011-08-25: 50 ug via INTRAVENOUS
  Administered 2011-08-25 (×2): 100 ug via INTRAVENOUS
  Administered 2011-08-25: 150 ug via INTRAVENOUS

## 2011-08-25 MED ORDER — METOPROLOL TARTRATE 25 MG/10 ML ORAL SUSPENSION: 12.5000 mg | Freq: Two times a day (BID) | ORAL | Status: DC

## 2011-08-25 MED ORDER — ASPIRIN EC 325 MG PO TBEC: 325.0000 mg | DELAYED_RELEASE_TABLET | Freq: Every day | ORAL | Status: DC

## 2011-08-25 MED ORDER — BISACODYL 5 MG PO TBEC: 10.0000 mg | DELAYED_RELEASE_TABLET | Freq: Every day | ORAL | Status: DC

## 2011-08-25 MED ORDER — LACTATED RINGERS IV SOLN
INTRAVENOUS | Status: DC | PRN
  Administered 2011-08-25: 08:00:00 via INTRAVENOUS

## 2011-08-25 MED ORDER — SODIUM CHLORIDE 0.9 % IJ SOLN
3.0000 mL | Freq: Two times a day (BID) | INTRAMUSCULAR | Status: DC
  Administered 2011-08-26: 3 mL via INTRAVENOUS
  Administered 2011-08-26: 10 mL via INTRAVENOUS
  Administered 2011-08-27 – 2011-08-28 (×4): 3 mL via INTRAVENOUS
  Administered 2011-08-29 – 2011-08-30 (×3): 10 mL via INTRAVENOUS
  Administered 2011-08-30: 3 mL via INTRAVENOUS
  Administered 2011-08-31: 09:00:00 via INTRAVENOUS

## 2011-08-25 MED ORDER — SODIUM CHLORIDE 0.9 % IV SOLN
20.0000 mg | INTRAVENOUS | Status: DC | PRN
  Administered 2011-08-25: 20 ug/min via INTRAVENOUS

## 2011-08-25 MED ORDER — MILRINONE IN DEXTROSE 200-5 MCG/ML-% IV SOLN
0.1250 ug/kg/min | INTRAVENOUS | Status: DC
  Filled 2011-08-25: qty 100

## 2011-08-25 MED ORDER — BISACODYL 10 MG RE SUPP: 10.0000 mg | Freq: Every day | RECTAL | Status: DC

## 2011-08-25 MED ORDER — ONDANSETRON HCL 4 MG/2ML IJ SOLN: 4.0000 mg | Freq: Four times a day (QID) | INTRAMUSCULAR | Status: DC | PRN

## 2011-08-25 MED ORDER — ACETAMINOPHEN 160 MG/5ML PO SOLN: 975.0000 mg | Freq: Four times a day (QID) | ORAL | Status: DC

## 2011-08-25 MED ORDER — SODIUM CHLORIDE 0.9 % IJ SOLN: 3.0000 mL | INTRAMUSCULAR | Status: DC | PRN

## 2011-08-25 MED ORDER — LIDOCAINE HCL (CARDIAC) 20 MG/ML IV SOLN
INTRAVENOUS | Status: AC
  Filled 2011-08-25: qty 5

## 2011-08-25 MED ORDER — MORPHINE SULFATE 2 MG/ML IJ SOLN
2.0000 mg | INTRAMUSCULAR | Status: DC | PRN
  Administered 2011-08-27 – 2011-08-29 (×7): 2 mg via INTRAVENOUS
  Filled 2011-08-25 (×8): qty 1

## 2011-08-25 MED ORDER — MORPHINE SULFATE 2 MG/ML IJ SOLN: 2.0000 mg | INTRAMUSCULAR | Status: DC | PRN

## 2011-08-25 MED ORDER — CHLORHEXIDINE GLUCONATE 4 % EX LIQD
CUTANEOUS | Status: AC
  Administered 2011-08-25: 4 via TOPICAL
  Filled 2011-08-25: qty 60

## 2011-08-25 MED ORDER — 0.9 % SODIUM CHLORIDE (POUR BTL) OPTIME
TOPICAL | Status: DC | PRN
  Administered 2011-08-25: 1000 mL

## 2011-08-25 MED ORDER — LACTATED RINGERS IV SOLN: INTRAVENOUS | Status: DC

## 2011-08-25 MED ORDER — SODIUM CHLORIDE 0.9 % IV SOLN
INTRAVENOUS | Status: DC | PRN
  Administered 2011-08-25 (×2): via INTRAVENOUS

## 2011-08-25 MED ORDER — VANCOMYCIN HCL IN DEXTROSE 1-5 GM/200ML-% IV SOLN
1000.0000 mg | Freq: Once | INTRAVENOUS | Status: AC
  Administered 2011-08-25: 1000 mg via INTRAVENOUS
  Filled 2011-08-25: qty 200

## 2011-08-25 MED ORDER — DOPAMINE-DEXTROSE 3.2-5 MG/ML-% IV SOLN
2.0000 ug/kg/min | INTRAVENOUS | Status: DC
  Administered 2011-08-26 – 2011-08-27 (×3): 10 ug/kg/min via INTRAVENOUS
  Administered 2011-08-28: 8 ug/kg/min via INTRAVENOUS
  Filled 2011-08-25 (×6): qty 250

## 2011-08-25 MED ORDER — FAMOTIDINE IN NACL 20-0.9 MG/50ML-% IV SOLN
20.0000 mg | Freq: Two times a day (BID) | INTRAVENOUS | Status: AC
  Administered 2011-08-26 (×2): 20 mg via INTRAVENOUS
  Filled 2011-08-25 (×2): qty 50

## 2011-08-25 MED ORDER — DOCUSATE SODIUM 100 MG PO CAPS
200.0000 mg | ORAL_CAPSULE | Freq: Every day | ORAL | Status: DC
  Administered 2011-08-28 – 2011-08-29 (×2): 200 mg via ORAL
  Filled 2011-08-25 (×3): qty 2

## 2011-08-25 MED ORDER — METOPROLOL TARTRATE 25 MG/10 ML ORAL SUSPENSION
12.5000 mg | Freq: Two times a day (BID) | ORAL | Status: DC
  Filled 2011-08-25 (×13): qty 5

## 2011-08-25 MED ORDER — LACTATED RINGERS IV SOLN: 500.0000 mL | Freq: Once | INTRAVENOUS | Status: DC | PRN

## 2011-08-25 MED ORDER — ACETAMINOPHEN 500 MG PO TABS: 1000.0000 mg | ORAL_TABLET | Freq: Four times a day (QID) | ORAL | Status: DC

## 2011-08-25 MED ORDER — SODIUM CHLORIDE 0.9 % IJ SOLN: 3.0000 mL | Freq: Two times a day (BID) | INTRAMUSCULAR | Status: DC

## 2011-08-25 MED ORDER — SODIUM BICARBONATE 8.4 % IV SOLN
INTRAVENOUS | Status: AC
  Filled 2011-08-25: qty 50

## 2011-08-25 MED ORDER — METOPROLOL TARTRATE 1 MG/ML IV SOLN: 2.5000 mg | INTRAVENOUS | Status: DC | PRN

## 2011-08-25 MED ORDER — ASPIRIN 81 MG PO CHEW
324.0000 mg | CHEWABLE_TABLET | Freq: Every day | ORAL | Status: DC
  Administered 2011-08-26: 324 mg
  Filled 2011-08-25: qty 4

## 2011-08-25 MED ORDER — ONDANSETRON HCL 4 MG/2ML IJ SOLN
4.0000 mg | Freq: Four times a day (QID) | INTRAMUSCULAR | Status: DC | PRN
  Administered 2011-08-28 – 2011-08-29 (×4): 4 mg via INTRAVENOUS
  Filled 2011-08-25 (×4): qty 2

## 2011-08-25 MED ORDER — ALBUMIN HUMAN 5 % IV SOLN
250.0000 mL | INTRAVENOUS | Status: AC | PRN
  Administered 2011-08-25 – 2011-08-26 (×4): 250 mL via INTRAVENOUS
  Filled 2011-08-25 (×2): qty 250

## 2011-08-25 MED ORDER — INSULIN REGULAR BOLUS VIA INFUSION: 0.0000 [IU] | Freq: Three times a day (TID) | INTRAVENOUS | Status: DC

## 2011-08-25 MED ORDER — ACETAMINOPHEN 160 MG/5ML PO SOLN
650.0000 mg | ORAL | Status: AC
  Filled 2011-08-25 (×3): qty 20.3

## 2011-08-25 MED ORDER — OXYCODONE HCL 5 MG PO TABS: 5.0000 mg | ORAL_TABLET | ORAL | Status: DC | PRN

## 2011-08-25 MED ORDER — LACTATED RINGERS IV SOLN
INTRAVENOUS | Status: DC | PRN
  Administered 2011-08-25 (×2): via INTRAVENOUS

## 2011-08-25 MED ORDER — SODIUM CHLORIDE 0.9 % IV SOLN
INTRAVENOUS | Status: DC | PRN
  Administered 2011-08-25: 07:00:00 via INTRAVENOUS

## 2011-08-25 MED ORDER — DOCUSATE SODIUM 100 MG PO CAPS: 200.0000 mg | ORAL_CAPSULE | Freq: Every day | ORAL | Status: DC

## 2011-08-25 MED ORDER — ETOMIDATE 2 MG/ML IV SOLN
INTRAVENOUS | Status: DC | PRN
  Administered 2011-08-25: 18 mg via INTRAVENOUS

## 2011-08-25 MED ORDER — PANTOPRAZOLE SODIUM 40 MG PO TBEC: 40.0000 mg | DELAYED_RELEASE_TABLET | Freq: Every day | ORAL | Status: DC

## 2011-08-25 MED ORDER — INSULIN ASPART 100 UNIT/ML ~~LOC~~ SOLN
0.0000 [IU] | SUBCUTANEOUS | Status: DC
  Administered 2011-08-26 – 2011-08-28 (×6): 2 [IU] via SUBCUTANEOUS

## 2011-08-25 MED ORDER — DEXMEDETOMIDINE HCL IN NACL 400 MCG/100ML IV SOLN
0.1000 ug/kg/h | INTRAVENOUS | Status: DC
  Administered 2011-08-25 (×2): 0.7 ug/kg/h via INTRAVENOUS
  Administered 2011-08-26: 0.6 ug/kg/h via INTRAVENOUS
  Administered 2011-08-26: 0.5 ug/kg/h via INTRAVENOUS
  Administered 2011-08-27: 0.6 ug/kg/h via INTRAVENOUS
  Filled 2011-08-25 (×3): qty 100
  Filled 2011-08-25: qty 50
  Filled 2011-08-25 (×3): qty 100
  Filled 2011-08-25: qty 50

## 2011-08-25 MED ORDER — PANTOPRAZOLE SODIUM 40 MG PO TBEC
40.0000 mg | DELAYED_RELEASE_TABLET | Freq: Every day | ORAL | Status: DC
  Administered 2011-08-28 – 2011-08-30 (×3): 40 mg via ORAL
  Filled 2011-08-25 (×3): qty 1

## 2011-08-25 MED ORDER — METOPROLOL TARTRATE 12.5 MG HALF TABLET: 12.5000 mg | ORAL_TABLET | Freq: Two times a day (BID) | ORAL | Status: DC

## 2011-08-25 MED ORDER — LACTATED RINGERS IV SOLN
INTRAVENOUS | Status: DC
  Administered 2011-08-30: 20 mL via INTRAVENOUS

## 2011-08-25 MED ORDER — FAMOTIDINE IN NACL 20-0.9 MG/50ML-% IV SOLN
20.0000 mg | Freq: Two times a day (BID) | INTRAVENOUS | Status: DC
  Administered 2011-08-25: 20 mg via INTRAVENOUS

## 2011-08-25 MED ORDER — DEXTROSE 50 % IV SOLN
INTRAVENOUS | Status: AC
  Administered 2011-08-25: 16 mL
  Administered 2011-08-25: 14 mL
  Filled 2011-08-25: qty 50

## 2011-08-25 MED ORDER — MOXIFLOXACIN HCL IN NACL 400 MG/250ML IV SOLN
INTRAVENOUS | Status: DC | PRN
  Administered 2011-08-25: 400 mg via INTRAVENOUS

## 2011-08-25 MED ORDER — ACETAMINOPHEN 160 MG/5ML PO SOLN: 650.0000 mg | ORAL | Status: DC

## 2011-08-25 MED ORDER — ACETAMINOPHEN 500 MG PO TABS
1000.0000 mg | ORAL_TABLET | Freq: Four times a day (QID) | ORAL | Status: AC
  Administered 2011-08-28 – 2011-08-30 (×7): 1000 mg via ORAL
  Filled 2011-08-25 (×17): qty 2

## 2011-08-25 MED ORDER — SUCCINYLCHOLINE CHLORIDE 20 MG/ML IJ SOLN
INTRAMUSCULAR | Status: DC | PRN
  Administered 2011-08-25: 100 mg via INTRAVENOUS

## 2011-08-25 MED ORDER — ROCURONIUM BROMIDE 100 MG/10ML IV SOLN
INTRAVENOUS | Status: DC | PRN
  Administered 2011-08-25: 20 mg via INTRAVENOUS
  Administered 2011-08-25: 30 mg via INTRAVENOUS
  Administered 2011-08-25 (×2): 50 mg via INTRAVENOUS
  Administered 2011-08-25: 30 mg via INTRAVENOUS

## 2011-08-25 MED ORDER — MORPHINE SULFATE 2 MG/ML IJ SOLN: 1.0000 mg | INTRAMUSCULAR | Status: AC | PRN

## 2011-08-25 MED ORDER — ALBUMIN HUMAN 5 % IV SOLN
INTRAVENOUS | Status: AC
  Filled 2011-08-25: qty 250

## 2011-08-25 MED ORDER — MAGNESIUM SULFATE 40 MG/ML IJ SOLN: 4.0000 g | Freq: Once | INTRAMUSCULAR | Status: DC

## 2011-08-25 MED ORDER — VANCOMYCIN HCL IN DEXTROSE 1-5 GM/200ML-% IV SOLN: 1000.0000 mg | Freq: Once | INTRAVENOUS | Status: DC

## 2011-08-25 MED ORDER — POTASSIUM CHLORIDE 10 MEQ/50ML IV SOLN
10.0000 meq | INTRAVENOUS | Status: AC
  Administered 2011-08-25 (×3): 10 meq via INTRAVENOUS

## 2011-08-25 MED ORDER — ACETAMINOPHEN 650 MG RE SUPP: 650.0000 mg | RECTAL | Status: DC

## 2011-08-25 MED ORDER — HEPARIN SODIUM (PORCINE) 1000 UNIT/ML IJ SOLN
INTRAMUSCULAR | Status: DC | PRN
  Administered 2011-08-25: 40000 [IU] via INTRAVENOUS

## 2011-08-25 MED ORDER — DEXMEDETOMIDINE HCL IN NACL 200 MCG/50ML IV SOLN: 0.1000 ug/kg/h | INTRAVENOUS | Status: DC

## 2011-08-25 MED ORDER — ACETAMINOPHEN 650 MG RE SUPP
650.0000 mg | RECTAL | Status: AC
  Administered 2011-08-25: 650 mg via RECTAL

## 2011-08-25 MED ORDER — SODIUM CHLORIDE 0.9 % IJ SOLN
OROMUCOSAL | Status: DC | PRN
  Administered 2011-08-25 (×3): via TOPICAL

## 2011-08-25 MED FILL — Medication: Qty: 1 | Status: AC

## 2011-08-25 SURGICAL SUPPLY — 135 items
ADAPTER CARDIO PERF ANTE/RETRO (ADAPTER) ×2 IMPLANT
APPLICATOR COTTON TIP 6IN STRL (MISCELLANEOUS) IMPLANT
ATTRACTOMAT 16X20 MAGNETIC DRP (DRAPES) ×2 IMPLANT
BAG DECANTER FOR FLEXI CONT (MISCELLANEOUS) ×4 IMPLANT
BANDAGE ELASTIC 4 VELCRO ST LF (GAUZE/BANDAGES/DRESSINGS) ×2 IMPLANT
BANDAGE ELASTIC 6 VELCRO ST LF (GAUZE/BANDAGES/DRESSINGS) ×2 IMPLANT
BANDAGE GAUZE ELAST BULKY 4 IN (GAUZE/BANDAGES/DRESSINGS) ×2 IMPLANT
BLADE STERNUM SYSTEM 6 (BLADE) ×2 IMPLANT
BLADE SURG 15 STRL LF DISP TIS (BLADE) ×1 IMPLANT
BLADE SURG 15 STRL SS (BLADE) ×1
BLADE SURG ROTATE 9660 (MISCELLANEOUS) IMPLANT
BOOT SUTURE AID YELLOW STND (SUTURE) ×2 IMPLANT
CANISTER SUCTION 2500CC (MISCELLANEOUS) ×2 IMPLANT
CANN PRFSN .5XCNCT 15X34-48 (MISCELLANEOUS) ×1
CANNULA AORTIC HI-FLOW 6.5M20F (CANNULA) ×2 IMPLANT
CANNULA GUNDRY RCSP 15FR (MISCELLANEOUS) ×2 IMPLANT
CANNULA PRFSN .5XCNCT 15X34-48 (MISCELLANEOUS) ×1 IMPLANT
CANNULA VEN 2 STAGE (MISCELLANEOUS) ×1
CANNULA VENOUS DUAL 32/40 (CANNULA) IMPLANT
CATH CPB KIT GERHARDT (MISCELLANEOUS) ×2 IMPLANT
CATH HEART VENT LEFT (CATHETERS) ×1 IMPLANT
CATH RETROPLEGIA CORONARY 14FR (CATHETERS) ×2 IMPLANT
CATH THORACIC 28FR (CATHETERS) ×2 IMPLANT
CATH THORACIC 36FR (CATHETERS) IMPLANT
CATH THORACIC 36FR RT ANG (CATHETERS) IMPLANT
CATH/SQUID NICHOLS JEHLE COR (CATHETERS) IMPLANT
CLIP RETRACTION 3.0MM CORONARY (MISCELLANEOUS) ×2 IMPLANT
CLIP TI MEDIUM 24 (CLIP) IMPLANT
CLIP TI WIDE RED SMALL 24 (CLIP) IMPLANT
CLOTH BEACON ORANGE TIMEOUT ST (SAFETY) ×2 IMPLANT
CONN ST 1/4X3/8  BEN (MISCELLANEOUS) ×1
CONN ST 1/4X3/8 BEN (MISCELLANEOUS) ×1 IMPLANT
CONT SPEC 4OZ CLIKSEAL STRL BL (MISCELLANEOUS) ×2 IMPLANT
COVER SURGICAL LIGHT HANDLE (MISCELLANEOUS) ×4 IMPLANT
CRADLE DONUT ADULT HEAD (MISCELLANEOUS) ×2 IMPLANT
DRAIN CHANNEL 28F RND 3/8 FF (WOUND CARE) ×2 IMPLANT
DRAIN CHANNEL 32F RND 10.7 FF (WOUND CARE) IMPLANT
DRAPE CARDIOVASCULAR INCISE (DRAPES) ×1
DRAPE SLUSH MACHINE 52X66 (DRAPES) ×2 IMPLANT
DRAPE SLUSH/WARMER DISC (DRAPES) IMPLANT
DRAPE SRG 135X102X78XABS (DRAPES) ×1 IMPLANT
DRSG COVADERM 4X14 (GAUZE/BANDAGES/DRESSINGS) ×2 IMPLANT
ELECT BLADE 4.0 EZ CLEAN MEGAD (MISCELLANEOUS) ×2
ELECT CAUTERY BLADE 6.4 (BLADE) ×2 IMPLANT
ELECT REM PT RETURN 9FT ADLT (ELECTROSURGICAL) ×4
ELECTRODE BLDE 4.0 EZ CLN MEGD (MISCELLANEOUS) ×1 IMPLANT
ELECTRODE REM PT RTRN 9FT ADLT (ELECTROSURGICAL) ×2 IMPLANT
GLOVE BIO SURGEON STRL SZ 6 (GLOVE) IMPLANT
GLOVE BIO SURGEON STRL SZ 6.5 (GLOVE) ×8 IMPLANT
GLOVE BIO SURGEON STRL SZ7 (GLOVE) IMPLANT
GLOVE BIO SURGEON STRL SZ7.5 (GLOVE) ×2 IMPLANT
GLOVE BIOGEL PI IND STRL 6 (GLOVE) IMPLANT
GLOVE BIOGEL PI IND STRL 6.5 (GLOVE) IMPLANT
GLOVE BIOGEL PI IND STRL 7.0 (GLOVE) ×6 IMPLANT
GLOVE BIOGEL PI INDICATOR 6 (GLOVE)
GLOVE BIOGEL PI INDICATOR 6.5 (GLOVE)
GLOVE BIOGEL PI INDICATOR 7.0 (GLOVE) ×6
GLOVE EUDERMIC 7 POWDERFREE (GLOVE) IMPLANT
GLOVE ORTHO TXT STRL SZ7.5 (GLOVE) IMPLANT
GOWN PREVENTION PLUS XLARGE (GOWN DISPOSABLE) ×2 IMPLANT
GOWN STRL NON-REIN LRG LVL3 (GOWN DISPOSABLE) ×10 IMPLANT
HEMOSTAT POWDER SURGIFOAM 1G (HEMOSTASIS) ×6 IMPLANT
HEMOSTAT SURGICEL 2X14 (HEMOSTASIS) ×2 IMPLANT
INSERT FOGARTY 61MM (MISCELLANEOUS) IMPLANT
INSERT FOGARTY XLG (MISCELLANEOUS) IMPLANT
KIT BASIN OR (CUSTOM PROCEDURE TRAY) ×2 IMPLANT
KIT ROOM TURNOVER OR (KITS) ×2 IMPLANT
KIT SUCTION CATH 14FR (SUCTIONS) ×4 IMPLANT
KIT VASOVIEW W/TROCAR VH 2000 (KITS) ×2 IMPLANT
LEAD PACING MYOCARDI (MISCELLANEOUS) ×2 IMPLANT
LINE VENT (MISCELLANEOUS) ×2 IMPLANT
MARKER GRAFT CORONARY BYPASS (MISCELLANEOUS) ×12 IMPLANT
NEEDLE 18GX1X1/2 (RX/OR ONLY) (NEEDLE) ×2 IMPLANT
NS IRRIG 1000ML POUR BTL (IV SOLUTION) ×12 IMPLANT
PACK OPEN HEART (CUSTOM PROCEDURE TRAY) ×2 IMPLANT
PAD ARMBOARD 7.5X6 YLW CONV (MISCELLANEOUS) ×4 IMPLANT
PAD ELECT DEFIB RADIOL ZOLL (MISCELLANEOUS) ×2 IMPLANT
PENCIL BUTTON HOLSTER BLD 10FT (ELECTRODE) ×2 IMPLANT
PUNCH AORTIC ROTATE 4.0MM (MISCELLANEOUS) ×2 IMPLANT
PUNCH AORTIC ROTATE 4.5MM 8IN (MISCELLANEOUS) IMPLANT
PUNCH AORTIC ROTATE 5MM 8IN (MISCELLANEOUS) IMPLANT
SET CARDIOPLEGIA MPS 5001102 (MISCELLANEOUS) ×2 IMPLANT
SOLUTION ANTI FOG 6CC (MISCELLANEOUS) IMPLANT
SPONGE GAUZE 4X4 12PLY (GAUZE/BANDAGES/DRESSINGS) ×4 IMPLANT
SPONGE LAP 18X18 X RAY DECT (DISPOSABLE) ×16 IMPLANT
SPONGE LAP 4X18 X RAY DECT (DISPOSABLE) ×2 IMPLANT
SURGIFLO W/THROMBIN 8M KIT (HEMOSTASIS) ×2 IMPLANT
SUT BONE WAX W31G (SUTURE) ×2 IMPLANT
SUT ETHIBON 2 0 V 52N 30 (SUTURE) ×4 IMPLANT
SUT MNCRL AB 4-0 PS2 18 (SUTURE) IMPLANT
SUT PROLENE 3 0 RB 1 (SUTURE) ×2 IMPLANT
SUT PROLENE 3 0 SH 1 (SUTURE) ×2 IMPLANT
SUT PROLENE 3 0 SH DA (SUTURE) IMPLANT
SUT PROLENE 3 0 SH1 36 (SUTURE) ×2 IMPLANT
SUT PROLENE 4 0 RB 1 (SUTURE) ×4
SUT PROLENE 4 0 SH DA (SUTURE) IMPLANT
SUT PROLENE 4 0 TF (SUTURE) ×4 IMPLANT
SUT PROLENE 4-0 RB1 .5 CRCL 36 (SUTURE) ×4 IMPLANT
SUT PROLENE 5 0 C 1 36 (SUTURE) IMPLANT
SUT PROLENE 6 0 C 1 30 (SUTURE) IMPLANT
SUT PROLENE 6 0 CC (SUTURE) ×4 IMPLANT
SUT PROLENE 7 0 BV 1 (SUTURE) IMPLANT
SUT PROLENE 7 0 BV1 MDA (SUTURE) ×2 IMPLANT
SUT PROLENE 7.0 RB 3 (SUTURE) IMPLANT
SUT PROLENE 8 0 BV175 6 (SUTURE) ×4 IMPLANT
SUT SILK  1 MH (SUTURE)
SUT SILK 1 MH (SUTURE) IMPLANT
SUT SILK 2 0 SH CR/8 (SUTURE) IMPLANT
SUT SILK 3 0 SH CR/8 (SUTURE) IMPLANT
SUT STEEL 6MS V (SUTURE) ×2 IMPLANT
SUT STEEL STERNAL CCS#1 18IN (SUTURE) IMPLANT
SUT STEEL SZ 6 DBL 3X14 BALL (SUTURE) ×2 IMPLANT
SUT VIC AB 1 CTX 18 (SUTURE) ×4 IMPLANT
SUT VIC AB 1 CTX 36 (SUTURE)
SUT VIC AB 1 CTX36XBRD ANBCTR (SUTURE) IMPLANT
SUT VIC AB 2-0 CT1 27 (SUTURE) ×1
SUT VIC AB 2-0 CT1 TAPERPNT 27 (SUTURE) ×1 IMPLANT
SUT VIC AB 2-0 CTX 27 (SUTURE) IMPLANT
SUT VIC AB 3-0 SH 27 (SUTURE)
SUT VIC AB 3-0 SH 27X BRD (SUTURE) IMPLANT
SUT VIC AB 3-0 X1 27 (SUTURE) ×2 IMPLANT
SUT VICRYL 4-0 PS2 18IN ABS (SUTURE) IMPLANT
SUTURE E-PAK OPEN HEART (SUTURE) ×4 IMPLANT
SYSTEM SAHARA CHEST DRAIN ATS (WOUND CARE) ×2 IMPLANT
TAPE CLOTH SOFT 2X10 (GAUZE/BANDAGES/DRESSINGS) ×4 IMPLANT
TOWEL OR 17X24 6PK STRL BLUE (TOWEL DISPOSABLE) ×4 IMPLANT
TOWEL OR 17X26 10 PK STRL BLUE (TOWEL DISPOSABLE) ×4 IMPLANT
TRAY FOLEY IC TEMP SENS 14FR (CATHETERS) ×2 IMPLANT
TUBE FEEDING 8FR 16IN STR KANG (MISCELLANEOUS) ×2 IMPLANT
TUBE SUCT INTRACARD DLP 20F (MISCELLANEOUS) ×2 IMPLANT
TUBING INSUFFLATION 10FT LAP (TUBING) ×2 IMPLANT
UNDERPAD 30X30 INCONTINENT (UNDERPADS AND DIAPERS) ×2 IMPLANT
VALVE MAGNA EASE AORTIC 23MM (Prosthesis & Implant Heart) ×2 IMPLANT
VENT LEFT HEART 12002 (CATHETERS) ×2
WATER STERILE IRR 1000ML POUR (IV SOLUTION) ×4 IMPLANT

## 2011-08-25 NOTE — Anesthesia Procedure Notes (Addendum)
Procedure Name: Intubation Date/Time: 08/25/2011 8:06 AM Performed by: Darcey Nora B Pre-anesthesia Checklist: Patient identified, Emergency Drugs available, Suction available and Patient being monitored Patient Re-evaluated:Patient Re-evaluated prior to inductionOxygen Delivery Method: Circle system utilized Preoxygenation: Pre-oxygenation with 100% oxygen Intubation Type: IV induction Ventilation: Mask ventilation without difficulty Tube type: Oral Tube size: 8.0 mm Number of attempts: 1 Airway Equipment and Method: Stylet Placement Confirmation: ETT inserted through vocal cords under direct vision,  positive ETCO2 and breath sounds checked- equal and bilateral Secured at: 23 (cm at gum) cm Tube secured with: Tape Dental Injury: Teeth and Oropharynx as per pre-operative assessment     Procedures: Left IJ Theone Murdoch Catheter Insertion: [id number]: The patient was identified and consent obtained.  TO was performed, and full barrier precautions were used.  The skin was anesthetized with lidocaine-4cc plain with 25g needle.  Once the vein was located with the 22 ga. needle using ultrasound guidance , the wire was inserted into the vein.  The wire location was confirmed with ultrasound.  The tissue was dilated and the 8.5 [place] cordis catheter was carefully inserted. Afterwards Theone Murdoch catheter was inserted. PA catheter at 45cm.  The patient tolerated the procedure well.   CE

## 2011-08-25 NOTE — Progress Notes (Signed)
TELEMETRY: Reviewed telemetry pt in sinus tachycardia with occ PVCs, no VT: Filed Vitals:   08/25/11 0300 08/25/11 0400 08/25/11 0500 08/25/11 0600  BP: 100/54 95/43 89/66  107/61  Pulse: 97 97 102 102  Temp:  98.3 F (36.8 C)    TempSrc:  Oral    Resp: 19 27 27 23   Height:      Weight:   81.4 kg (179 lb 7.3 oz)   SpO2: 99% 96% 97% 97%    Intake/Output Summary (Last 24 hours) at 08/25/11 0655 Last data filed at 08/25/11 0600  Gross per 24 hour  Intake 2077.55 ml  Output   1055 ml  Net 1022.55 ml    SUBJECTIVE Feels well this am. No chest pain. No SOB. Slept well  LABS: Basic Metabolic Panel:  Basename 08/24/11 2048 08/24/11 1600 08/24/11 0400  NA 138 138 --  K 3.7 3.6 --  CL 102 103 --  CO2 26 26 --  GLUCOSE 134* 162* --  BUN 35* 36* --  CREATININE 0.72 0.80 --  CALCIUM 8.8 8.8 --  MG -- -- 2.5  PHOS -- -- --   CBC:  Basename 08/25/11 0415 08/24/11 1600  WBC 10.1 11.9*  NEUTROABS -- --  HGB 11.1* 11.6*  HCT 33.2* 34.9*  MCV 90.7 89.5  PLT 194 207   Cardiac Enzymes:  Basename 08/24/11 0415 08/23/11 2122 08/23/11 1620  CKTOTAL 428* 98 112  CKMB 44.5* 7.2* 8.8*  CKMBINDEX -- -- --  TROPONINI 9.61* 1.51* 1.84*    Radiology/Studies:  Dg Chest Port 1 View  08/24/2011  *RADIOLOGY REPORT*  Clinical Data: Preoperative examination (AVR); intra-aortic balloon pump evaluation.  PORTABLE CHEST - 1 VIEW  Comparison: None.  Findings: Enlarged cardiac silhouette and mediastinal contours. The tip of an intra-aortic balloon pump is approximately 4.7 cm from the superior aspect of the aortic arch. Pulmonary vascular is indistinct with cephalization of flow.  Bilateral infrahilar heterogeneous and left basilar consolidative opacities.  There is blunting of bilateral costophrenic angles suggesting small effusions.  No definite pneumothorax.  No definite acute osseous abnormalities.  IMPRESSION: 1.  Enlarged cardiac silhouette and mediastinal contours with findings  suggestive of pulmonary edema and small bilateral effusions. 2.  Tip of IABP approximately 4.7 cm from the superior aspect of the aortic arch. 3.  Bilateral infrahilar and left basilar/retrocardiac opacities, atelectasis versus infiltrate.  Original Report Authenticated By: Waynard Reeds, M.D.    PHYSICAL EXAM General: elderly, well nourished, in no acute distress. Head: Normocephalic, atraumatic, sclera non-icteric, no xanthomas, nares are without discharge. Neck: Negative for carotid bruits. JVD not elevated. Lungs: Clear bilaterally to auscultation anteriorly without wheezes, rales, or rhonchi. Breathing is unlabored. Heart: RRR S1 S2 with grade 2-3/6 harsh systolic murmur RUSB to apex.  Abdomen: Soft, non-tender, non-distended with normoactive bowel sounds. No hepatomegaly. No rebound/guarding. No obvious abdominal masses. Extremities: No clubbing, cyanosis or edema.  Distal pedal pulses are 2+ and equal bilaterally. IABP located in right groin without hematoma. Neuro: Alert and oriented X 3. Moves all extremities spontaneously. Psych:  Responds to questions appropriately with a normal affect.  ASSESSMENT AND PLAN: 1. NSTEMI. Patient with critical CAD- occluded RCA and LCX. 99% large intermediate branch. 50-70% left main. Recurrent pain  relieved with IABP. For CABG/AVR today.   2. S/p agonal cardiac arrest. Brief resuscitation with persistent profound hypotension. Requiring IABP and pressors to stabilize BP.   3. Severe AS with moderate AI and MR.   4. Cardiogenic shock  with hypotension/ mild pulmonary edema in setting of severe AS, critical CAD, and LV dysfunction with EF 35%. Continue pressor support and IABP. Hold diuretics with low BP. Oxygenation is good.  5. Hyperlipidemia. Continue statin.     Principal Problem:  *Severe aortic stenosis Active Problems:  CAD (coronary artery disease), native coronary artery  Hypertension  Hyperlipidemia  Chronic systolic CHF  (congestive heart failure)  Moderate mitral regurgitation  Moderate aortic insufficiency  Cardiogenic shock  NSTEMI (non-ST elevated myocardial infarction)    Signed, Seymore Brodowski [place] MD,FACC 08/25/2011 6:55 AM

## 2011-08-25 NOTE — OR Nursing (Signed)
Rectal temperature probe inserted prior to surgery without difficulty. Defibrillator pads placed on right, posterior portion of upper back, and left lateral chest.

## 2011-08-25 NOTE — Anesthesia Postprocedure Evaluation (Signed)
  Anesthesia Post-op Note  Patient: Gabriel Marsh  Procedure(s) Performed: Procedure(s) (LRB): CORONARY ARTERY BYPASS GRAFTING (CABG) (N/A) AORTIC VALVE REPLACEMENT (AVR) (N/A)  Patient Location: PACU and SICU  Anesthesia Type: General  Level of Consciousness: Patient remains intubated per anesthesia plan  Airway and Oxygen Therapy: Patient remains intubated per anesthesia plan  Post-op Pain: mild  Post-op Assessment: Post-op Vital signs reviewed  Post-op Vital Signs: Reviewed  Complications: No apparent anesthesia complications

## 2011-08-25 NOTE — Transfer of Care (Signed)
Immediate Anesthesia Transfer of Care Note  Patient: Gabriel Marsh  Procedure(s) Performed: Procedure(s) (LRB): CORONARY ARTERY BYPASS GRAFTING (CABG) (N/A) AORTIC VALVE REPLACEMENT (AVR) (N/A)  Patient Location: SICU  Anesthesia Type: General  Level of Consciousness: sedated and Patient remains intubated per anesthesia plan  Airway & Oxygen Therapy: Patient remains intubated per anesthesia plan and Patient placed on Ventilator (see vital sign flow sheet for setting)  Post-op Assessment: Post -op Vital signs reviewed and stable and report given to [place], RN  Post vital signs: Reviewed and stable  Complications: No apparent anesthesia complications

## 2011-08-25 NOTE — Brief Op Note (Signed)
08/23/2011 - 08/25/2011  2:43 PM  PATIENT:  Gabriel Marsh  78 y.o. male  PRE-OPERATIVE DIAGNOSIS:  CAD and Aortic Stenosis, recent acute non stimi MI with poor lv function  POST-OPERATIVE DIAGNOSIS:  same  PROCEDURE:  Procedure(s) (LRB): CORONARY ARTERY BYPASS GRAFTING (CABG) (N/A) LIMA to LAD, Vein to Ramus, vein to RCA AORTIC VALVE REPLACEMENT (AVR)  With 23 MAGNA pericardial tissue valve With endo vein rt leg  SURGEON:  Surgeon(s) and Role:    * Delight Ovens, MD - Primary  PHYSICIAN ASSISTANT: Gershon Crane CPA    ANESTHESIA:   general  EBL:  Total I/O In: 2667 [I.V.:1900; Blood:517; IV Piggyback:250] Out: 600 [Urine:600]  BLOOD ADMINISTERED:517 CC CELLSAVER  DRAINS: Nasogastric Tube, Urinary Catheter (Foley), one 28  Chest Tube(s) in the left and (two) Blake drain(s) in the mediastinum   IAB still in place from preop rt femoral artery  SPECIMEN:  Source of Specimen:  aortic valve  DISPOSITION OF SPECIMEN:  PATHOLOGY  COUNTS:  YES  DICTATION: .Other Dictation: Dictation Number   PLAN OF CARE: transfer to sicu  PATIENT DISPOSITION:  ICU - intubated and hemodynamically stable.   Delay start of Pharmacological VTE agent (>24hrs) due to surgical blood loss or risk of bleeding: yes

## 2011-08-25 NOTE — Progress Notes (Signed)
[facility] BRIEF SICU PROGRESS NOTE  Day of Surgery  S/P Procedure(s) (LRB): CORONARY ARTERY BYPASS GRAFTING (CABG) (N/A) AORTIC VALVE REPLACEMENT (AVR) (N/A)   Sedated on vent MAP stable 60 mmHg with IABP 1:2 with dopamine @ 8 PA pressures low, C.I. >2 Chest tube output low UOP 45-60 mL/hr  Plan: Keep intubated overnight.  Wean drips as tolerated.  Wean IABP in am.  Gabriel Marsh 08/25/2011 8:44 PM

## 2011-08-25 NOTE — Anesthesia Postprocedure Evaluation (Signed)
  Anesthesia Post-op Note  Patient: Gabriel Marsh  Procedure(s) Performed: Procedure(s) (LRB): CORONARY ARTERY BYPASS GRAFTING (CABG) (N/A) AORTIC VALVE REPLACEMENT (AVR) (N/A)  Patient Location: SICU  Anesthesia Type: General  Level of Consciousness: sedated and Patient remains intubated per anesthesia plan  Airway and Oxygen Therapy: Patient remains intubated per anesthesia plan and Patient placed on Ventilator (see vital sign flow sheet for setting)  Post-op Pain: none  Post-op Assessment: Post-op Vital signs reviewed, Patient's Cardiovascular Status Stable, Respiratory Function Stable and No signs of Nausea or vomiting  Post-op Vital Signs: Reviewed and stable  Complications: No apparent anesthesia complications

## 2011-08-25 NOTE — Anesthesia Preprocedure Evaluation (Addendum)
Anesthesia Evaluation  Patient identified by MRN, date of birth, ID band Patient awake    Reviewed: Allergy & Precautions, H&P , NPO status , Patient's Chart, lab work & pertinent test results  Airway Mallampati: I TM Distance: >3 FB Neck ROM: full    Dental   Pulmonary shortness of breath,  breath sounds clear to auscultation        Cardiovascular hypertension, + CAD, + Past MI and +CHF + Valvular Problems/Murmurs AS, AI and MR Rhythm:regular Rate:Normal     Neuro/Psych    GI/Hepatic   Endo/Other    Renal/GU      Musculoskeletal   Abdominal   Peds  Hematology   Anesthesia Other Findings   Reproductive/Obstetrics                          Anesthesia Physical Anesthesia Plan  ASA: IV  Anesthesia Plan: General   Post-op Pain Management:    Induction: Intravenous  Airway Management Planned: Oral ETT  Additional Equipment: Arterial line, CVP, PA Cath and TEE  Intra-op Plan:   Post-operative Plan: Post-operative intubation/ventilation  Informed Consent: I have reviewed the patients History and Physical, chart, labs and discussed the procedure including the risks, benefits and alternatives for the proposed anesthesia with the patient or authorized representative who has indicated his/her understanding and acceptance.     Plan Discussed with: CRNA, Anesthesiologist and Surgeon  Anesthesia Plan Comments:         Anesthesia Quick Evaluation

## 2011-08-25 NOTE — Progress Notes (Signed)
  Echocardiogram Echocardiogram Transesophageal (OR) has been performed.  Yvonna Brun 08/25/2011, 9:11 AM

## 2011-08-25 NOTE — Progress Notes (Signed)
Patient transported via bed to OR @ 0715 with CRNA, Perfusionist, & RN.  ZOLL monitor attached.

## 2011-08-25 NOTE — Preoperative (Signed)
Beta Blockers   Reason not to administer Beta Blockers:Lopressor 5 a.m. today 

## 2011-08-26 ENCOUNTER — Inpatient Hospital Stay (HOSPITAL_COMMUNITY): Payer: Medicare Other

## 2011-08-26 ENCOUNTER — Encounter (HOSPITAL_COMMUNITY): Payer: Self-pay | Admitting: Cardiothoracic Surgery

## 2011-08-26 DIAGNOSIS — I5022 Chronic systolic (congestive) heart failure: Secondary | ICD-10-CM

## 2011-08-26 DIAGNOSIS — I509 Heart failure, unspecified: Secondary | ICD-10-CM

## 2011-08-26 DIAGNOSIS — J9600 Acute respiratory failure, unspecified whether with hypoxia or hypercapnia: Secondary | ICD-10-CM

## 2011-08-26 LAB — GLUCOSE, CAPILLARY
Glucose-Capillary: 141 mg/dL — ABNORMAL HIGH (ref 70–99)
Glucose-Capillary: 99 mg/dL (ref 70–99)
Glucose-Capillary: 111 mg/dL — ABNORMAL HIGH (ref 70–99)
Glucose-Capillary: 135 mg/dL — ABNORMAL HIGH (ref 70–99)

## 2011-08-26 LAB — CBC
HCT: 30.1 % — ABNORMAL LOW (ref 39.0–52.0)
Hemoglobin: 9.9 g/dL — ABNORMAL LOW (ref 13.0–17.0)
Hemoglobin: 9.7 g/dL — ABNORMAL LOW (ref 13.0–17.0)
MCH: 29.6 pg (ref 26.0–34.0)
MCH: 29.5 pg (ref 26.0–34.0)
MCHC: 32.9 g/dL (ref 30.0–36.0)
MCHC: 33 g/dL (ref 30.0–36.0)
MCV: 90.1 fL (ref 78.0–100.0)
Platelets: 141 K/uL — ABNORMAL LOW (ref 150–400)
RBC: 3.34 MIL/uL — ABNORMAL LOW (ref 4.22–5.81)
RDW: 13.9 % (ref 11.5–15.5)
RDW: 13.8 % (ref 11.5–15.5)
WBC: 12.9 K/uL — ABNORMAL HIGH (ref 4.0–10.5)

## 2011-08-26 LAB — POCT I-STAT 3, ART BLOOD GAS (G3+)
Acid-base deficit: 3 mmol/L — ABNORMAL HIGH (ref 0.0–2.0)
Bicarbonate: 21.5 mEq/L (ref 20.0–24.0)
Patient temperature: 37.8
TCO2: 23 mmol/L (ref 0–100)
pCO2 arterial: 32.3 mmHg — ABNORMAL LOW (ref 35.0–45.0)
pH, Arterial: 7.379 (ref 7.350–7.450)
pH, Arterial: 7.42 (ref 7.350–7.450)
pO2, Arterial: 195 mmHg — ABNORMAL HIGH (ref 80.0–100.0)

## 2011-08-26 LAB — CREATININE, SERUM: Creatinine, Ser: 0.64 mg/dL (ref 0.50–1.35)

## 2011-08-26 LAB — BASIC METABOLIC PANEL WITH GFR
BUN: 27 mg/dL — ABNORMAL HIGH (ref 6–23)
CO2: 20 meq/L (ref 19–32)
Calcium: 8 mg/dL — ABNORMAL LOW (ref 8.4–10.5)
Chloride: 109 meq/L (ref 96–112)
Creatinine, Ser: 0.71 mg/dL (ref 0.50–1.35)
GFR calc Af Amer: >90 mL/min
GFR calc non Af Amer: 87 mL/min — ABNORMAL LOW
Glucose, Bld: 153 mg/dL — ABNORMAL HIGH (ref 70–99)
Potassium: 4.6 meq/L (ref 3.5–5.1)
Sodium: 137 meq/L (ref 135–145)

## 2011-08-26 LAB — POCT I-STAT, CHEM 8
BUN: 24 mg/dL — ABNORMAL HIGH (ref 6–23)
Calcium, Ion: 1.18 mmol/L (ref 1.13–1.30)
Creatinine, Ser: 0.7 mg/dL (ref 0.50–1.35)
Hemoglobin: 10.2 g/dL — ABNORMAL LOW (ref 13.0–17.0)
Sodium: 137 mEq/L (ref 135–145)
TCO2: 19 mmol/L (ref 0–100)

## 2011-08-26 MED ORDER — BIOTENE DRY MOUTH MT LIQD
15.0000 mL | Freq: Four times a day (QID) | OROMUCOSAL | Status: DC
  Administered 2011-08-26 – 2011-08-28 (×8): 15 mL via OROMUCOSAL

## 2011-08-26 MED ORDER — CHLORHEXIDINE GLUCONATE 0.12 % MT SOLN
15.0000 mL | Freq: Two times a day (BID) | OROMUCOSAL | Status: DC
  Administered 2011-08-26 – 2011-08-28 (×4): 15 mL via OROMUCOSAL
  Filled 2011-08-26 (×4): qty 15

## 2011-08-26 MED FILL — Sodium Chloride IV Soln 0.9%: INTRAVENOUS | Qty: 2000 | Status: AC

## 2011-08-26 MED FILL — Magnesium Sulfate Inj 50%: INTRAMUSCULAR | Qty: 10 | Status: AC

## 2011-08-26 MED FILL — Sodium Bicarbonate IV Soln 8.4%: INTRAVENOUS | Qty: 50 | Status: AC

## 2011-08-26 MED FILL — Heparin Sodium (Porcine) Inj 1000 Unit/ML: INTRAMUSCULAR | Qty: 20 | Status: AC

## 2011-08-26 MED FILL — Lidocaine HCl IV Inj 20 MG/ML: INTRAVENOUS | Qty: 5 | Status: AC

## 2011-08-26 MED FILL — Calcium Chloride Inj 10%: INTRAVENOUS | Qty: 10 | Status: AC

## 2011-08-26 MED FILL — Sodium Chloride Irrigation Soln 0.9%: Qty: 3000 | Status: AC

## 2011-08-26 MED FILL — Heparin Sodium (Porcine) Inj 1000 Unit/ML: INTRAMUSCULAR | Qty: 30 | Status: AC

## 2011-08-26 MED FILL — Mannitol IV Soln 20%: INTRAVENOUS | Qty: 500 | Status: AC

## 2011-08-26 MED FILL — Electrolyte-R (PH 7.4) Solution: INTRAVENOUS | Qty: 4000 | Status: AC

## 2011-08-26 MED FILL — Potassium Chloride Inj 2 mEq/ML: INTRAVENOUS | Qty: 40 | Status: AC

## 2011-08-26 NOTE — Progress Notes (Signed)
Cardiac index at 0200 1.6.  BP 95/55 per arterial line, 107/64 via cuff.  Urine output trending down to 30-40cc/hr.  Dopamine increased from 74mcg/kg/min to 19mcg/kg/min and 250cc albumin given.  Will continue to monitor

## 2011-08-26 NOTE — Progress Notes (Signed)
INITIAL ADULT NUTRITION ASSESSMENT Date: 08/26/2011   Time: 10:21 AM  INTERVENTION:  If EN started, recommend initiation of Jevity 1.2 formula at 15 ml/hr and increase by 10 ml every 4 hours to goal rate of 65 ml/hr with Prostat liquid protein daily via tube to provide 1972 total kcals, 102 gm protein, 1259 ml of free water RD to follow for nutrition care plan  Reason for Assessment: VDRF  ASSESSMENT: Male 78 y.o.  Dx: Severe aortic stenosis  Hx:  Past Medical History  Diagnosis Date  . Hypertension   . Hyperlipidemia   . Mitral regurgitation     Moderate by 08/20/11 echo  . Shortness of breath   . CAD (coronary artery disease)     a) 08/22/11 cath :  50-60% left main stenosis, mild-moderate LAD disease, 99% ostial large diagonal stenosis, occluded and collateralized LCx and RCA  . Aortic stenosis     Severe by 08/20/11 echo  . Aortic insufficiency     Moderate by 08/20/11 echo  . Chronic systolic CHF (congestive heart failure)     a) 08/20/11 echo : LVEF 35-40%    Related Meds:     . acetaminophen (TYLENOL) oral liquid 160 mg/5 mL  650 mg Per Tube NOW  . acetaminophen  1,000 mg Oral Q6H   Or  . acetaminophen (TYLENOL) oral liquid 160 mg/5 mL  975 mg Per Tube Q6H  . acetaminophen  650 mg Rectal NOW  . antiseptic oral rinse  15 mL Mouth Rinse QID  . aspirin EC  325 mg Oral Daily   Or  . aspirin  324 mg Per Tube Daily  . bisacodyl  10 mg Oral Daily   Or  . bisacodyl  10 mg Rectal Daily  . chlorhexidine  15 mL Mouth Rinse BID  . dextrose      . docusate sodium  200 mg Oral Daily  . famotidine (PEPCID) IV  20 mg Intravenous BID  . insulin aspart  0-24 Units Subcutaneous Q2H   Followed by  . insulin aspart  0-24 Units Subcutaneous Q4H  . insulin regular  0-10 Units Intravenous TID WC  . magnesium sulfate  4 g Intravenous Once  . metoprolol tartrate  12.5 mg Oral BID   Or  . metoprolol tartrate  12.5 mg Per Tube BID  . moxifloxacin  400 mg Intravenous Once  .  pantoprazole  40 mg Oral Q1200  . potassium chloride  10 mEq Intravenous Q1 Hr x 3  . simvastatin  5 mg Oral q1800  . sodium chloride  3 mL Intravenous Q12H  . vancomycin  1,000 mg Intravenous Once  . DISCONTD: acetaminophen (TYLENOL) oral liquid 160 mg/5 mL  650 mg Per Tube NOW  . DISCONTD: acetaminophen (TYLENOL) oral liquid 160 mg/5 mL  975 mg Per Tube Q6H  . DISCONTD: acetaminophen  650 mg Rectal NOW  . DISCONTD: acetaminophen  1,000 mg Oral Q6H  . DISCONTD: aspirin  324 mg Per Tube Daily  . DISCONTD: aspirin  81 mg Oral Daily  . DISCONTD: aspirin EC  325 mg Oral Daily  . DISCONTD: bisacodyl  10 mg Oral Daily  . DISCONTD: bisacodyl  10 mg Rectal Daily  . DISCONTD: cefUROXime (ZINACEF)  IV  1.5 g Intravenous Q12H  . DISCONTD: cefUROXime (ZINACEF)  IV  1.5 g Intravenous Q12H  . DISCONTD: docusate sodium  200 mg Oral Daily  . DISCONTD: DOPamine  2-20 mcg/kg/min Intravenous To OR  . DISCONTD: epinephrine  0.5-20 mcg/min  Intravenous To OR  . DISCONTD: famotidine (PEPCID) IV  20 mg Intravenous Q12H  . DISCONTD: insulin regular  0-10 Units Intravenous TID WC  . DISCONTD: magnesium sulfate  40 mEq Other To OR  . DISCONTD: magnesium sulfate  4 g Intravenous Once  . DISCONTD: metoprolol tartrate  12.5 mg Per Tube BID  . DISCONTD: metoprolol tartrate  12.5 mg Oral Once  . DISCONTD: metoprolol tartrate  12.5 mg Oral BID  . DISCONTD: moxifloxacin  400 mg Intravenous To OR  . DISCONTD: pantoprazole  40 mg Oral Q1200  . DISCONTD: phenylephrine (NEO-SYNEPHRINE) Adult infusion  30-200 mcg/min Intravenous To OR  . DISCONTD: potassium chloride  10 mEq Intravenous Q1 Hr x 3  . DISCONTD: potassium chloride  80 mEq Other To OR  . DISCONTD: sodium chloride  3 mL Intravenous Q12H  . DISCONTD: sodium chloride  3 mL Intravenous Q12H  . DISCONTD: tranexamic acid  2 mg/kg Intracatheter To OR  . DISCONTD: vancomycin  1,000 mg Intravenous Once    Ht: 5\' 10"  (177.8 cm)  Wt: 206 lb 2.1 oz (93.5  kg)  Ideal Wt: 75.4 kg % Ideal Wt: 124%  Usual Wt: unable to obtain % Usual Wt: ---  Body mass index is 29.58 kg/(m^2).  Food/Nutrition Related Hx: no triggers per admission nutrition screen  Labs:  CMP     Component Value Date/Time   NA 137 08/26/2011 0345   K 4.6 08/26/2011 0345   CL 109 08/26/2011 0345   CO2 20 08/26/2011 0345   GLUCOSE 153* 08/26/2011 0345   BUN 27* 08/26/2011 0345   CREATININE 0.71 08/26/2011 0345   CALCIUM 8.0* 08/26/2011 0345   PROT 6.5 08/24/2011 2048   ALBUMIN 3.0* 08/24/2011 2048   AST 165* 08/24/2011 2048   ALT 39 08/24/2011 2048   ALKPHOS 57 08/24/2011 2048   BILITOT 0.4 08/24/2011 2048   GFRNONAA 87* 08/26/2011 0345   GFRAA >90 08/26/2011 0345     Intake/Output Summary (Last 24 hours) at 08/26/11 1036 Last data filed at 08/26/11 1016  Gross per 24 hour  Intake 8694.35 ml  Output   4490 ml  Net 4204.35 ml    CBG (last 3)   Basename 08/26/11 0754 08/26/11 0345 08/25/11 2340  GLUCAP 99 141* 119*    Diet Order: NPO  Supplements/Tube Feeding: N/A  IVF:    sodium chloride Last Rate: Stopped (08/25/11 1530)  sodium chloride Last Rate: Stopped (08/25/11 1530)  sodium chloride   dexmedetomidine Last Rate: 0.5 mcg/kg/hr (08/26/11 0900)  DOPamine Last Rate: 10 mcg/kg/min (08/26/11 0900)  insulin (NOVOLIN-R) infusion Last Rate: Stopped (08/25/11 1530)  lactated ringers Last Rate: 60 mL (08/25/11 1530)  milrinone Last Rate: Stopped (08/25/11 1828)  nitroGLYCERIN Last Rate: Stopped (08/25/11 1530)  phenylephrine (NEO-SYNEPHRINE) Adult infusion Last Rate: 70 mcg/min (08/26/11 0900)  DISCONTD: sodium chloride   DISCONTD: sodium chloride Last Rate: Stopped (08/25/11 1610)  DISCONTD: sodium chloride   DISCONTD: sodium chloride   DISCONTD: dexmedetomidine   DISCONTD: DOPamine Last Rate: 5 mcg/kg/min (08/25/11 1342)  DISCONTD: heparin Last Rate: 1,000 Units/hr (08/24/11 2126)  DISCONTD: insulin (NOVOLIN-R) infusion   DISCONTD: lactated ringers   DISCONTD:  milrinone Last Rate: Stopped (08/25/11 1345)  DISCONTD: nitroGLYCERIN   DISCONTD: phenylephrine (NEO-SYNEPHRINE) Adult infusion     Estimated Nutritional Needs:   Kcal: 1900-2100 Protein: 95-105 gm Fluid: 1.9-2.1 L  Patient is currently sedated and intubated on ventilator support.  MV: 8.1 Temp: 37.9 C  Patient s/p CABG and aortic valve replacement  8/1; post op had significant hypotension and was kept on pressors; OGT in place; no skin breakdown noted; normoactive bowel sounds, no edema present  NUTRITION DIAGNOSIS: -Inadequate oral intake (NI-2.1).  Status: Ongoing  RELATED TO: inability to eat  AS EVIDENCE BY: NPO status  MONITORING/EVALUATION(Goals): Goal: Initiate EN in next 24-48 hours if extubation not expected Monitor: EN initiation & regimen, respiratory status, weight, labs, I/O's  EDUCATION NEEDS: -No education needs identified at this time  Dietitian #: [phone]  DOCUMENTATION CODES Per approved criteria  -Not Applicable    Alger Memos 08/26/2011, 10:21 AM

## 2011-08-26 NOTE — Progress Notes (Signed)
Patient noted to have R medial arm skin tear and ecchymosis, present upon arrival to 2300 from OR.  Wound cleansed with warm water and soap.  Wound dressed with xeroform, 4x4's and loosely wrapped with kerlex.

## 2011-08-26 NOTE — Progress Notes (Signed)
TELEMETRY: Reviewed telemetry pt in NSR: Filed Vitals:   08/26/11 0615 08/26/11 0630 08/26/11 0645 08/26/11 0700  BP: 114/71 114/74 111/61 114/66  Pulse: 48 47 47 47  Temp: 100.2 F (37.9 C) 100.2 F (37.9 C) 100.2 F (37.9 C) 100.2 F (37.9 C)  TempSrc:      Resp: 12 12 12 12   Height:      Weight:      SpO2: 100% 100% 100% 100%    Intake/Output Summary (Last 24 hours) at 08/26/11 4332 Last data filed at 08/26/11 0703  Gross per 24 hour  Intake 9039.35 ml  Output   4615 ml  Net 4424.35 ml    SUBJECTIVE: Patient sedated on vent  LABS: Basic Metabolic Panel:  Basename 08/26/11 0345 08/25/11 2018 08/25/11 2015 08/24/11 2048  NA 137 138 -- --  K 4.6 4.8 -- --  CL 109 109 -- --  CO2 20 -- -- 26  GLUCOSE 153* 126* -- --  BUN 27* 25* -- --  CREATININE 0.71 0.60 -- --  CALCIUM 8.0* -- -- 8.8  MG 3.1* -- 3.4* --  PHOS -- -- -- --   Liver Function Tests:  [facility] 08/24/11 2048  AST 165*  ALT 39  ALKPHOS 57  BILITOT 0.4  PROT 6.5  ALBUMIN 3.0*   No results found for this basename: LIPASE:2,AMYLASE:2 in the last 72 hours CBC:  Basename 08/26/11 0345 08/25/11 2018 08/25/11 2015  WBC 12.9* -- 13.9*  NEUTROABS -- -- --  HGB 9.9* 9.5* --  HCT 30.1* 28.0* --  MCV 90.1 -- 90.7  PLT 141* -- 134*   Cardiac Enzymes:  Basename 08/24/11 0415 08/23/11 2122 08/23/11 1620  CKTOTAL 428* 98 112  CKMB 44.5* 7.2* 8.8*  CKMBINDEX -- -- --  TROPONINI 9.61* 1.51* 1.84*   Fasting Lipid Panel:  Basename 08/25/11 0415  CHOL 116  HDL 48  LDLCALC 58  TRIG 48  CHOLHDL 2.4  LDLDIRECT --   Ecg NSR with 1st degree AV block. IVCD. ST-T depression lateral leads.  Radiology/Studies:  Dg Chest Portable 1 View In Am  08/26/2011  *RADIOLOGY REPORT*  Clinical Data: Coronary artery bypass graft.  Aortic valve replacement.  PORTABLE CHEST - 1 VIEW  Comparison: 08/25/2011  Findings: Endotracheal tube 4.0 cm above carina.  Nasogastric extends beyond the  inferior aspect of the  film.  Left IJ Swan-Ganz catheter unchanged in position.  Right IJ central line with tip in mid SVC.  A left-sided chest tube is unchanged position.  Intra-aortic balloon pump unchanged and appropriate in position.  Mild cardiomegaly.  Trace layering left pleural fluid. No pneumothorax.  Improved to resolved interstitial edema.  Improved right base atelectasis.  Persistent patchy left base air space disease.  IMPRESSION:  1.  Appropriate position of support apparatus, without pneumothorax. 2.  Improved to resolved congestive heart failure with resolved right base atelectasis. 3.  Residual small left pleural effusion with adjacent atelectasis.  Original Report Authenticated By: Consuello Bossier, M.D.    PHYSICAL EXAM General: Elderly, sedated on vent, in no acute distress. Head: Normocephalic, atraumatic, sclera non-icteric, no xanthomas, nares are without discharge. Orally intubated Neck: Negative for carotid bruits. JVD not elevated. Lungs: Clear anteriorly to auscultation without wheezes, rales, or rhonchi. Heart: RRR S1 S2 without murmurs, rubs, or gallops.  Abdomen: Soft, non-tender, non-distended with normoactive bowel sounds.  Extremities: No clubbing, cyanosis or edema.  Pedal pulses are good. IABP in place right groin without hematoma.   ASSESSMENT  AND PLAN: 1. S/p CABG/ AVR with tissue valve. Still requiring hemodynamic support with IABP, Dopamine, and Neo. Cardiac index was higher on milrinone but BP improved since discontinued. Urine output good. Renal indices stable. Rhythm NSR. Wean support per CT surgery.  Principal Problem:  *Severe aortic stenosis Active Problems:  CAD (coronary artery disease), native coronary artery  Hypertension  Hyperlipidemia  Chronic systolic CHF (congestive heart failure)  Moderate mitral regurgitation  Moderate aortic insufficiency  Cardiogenic shock  NSTEMI (non-ST elevated myocardial infarction)    Signed, Estella Malatesta [place] MD,FACC 08/26/2011 8:29  AM

## 2011-08-26 NOTE — Progress Notes (Signed)
PT Note/Discharge  Order received.  Pt still on balloon pump and vent.  Will sign off for now. Please re-order as appropriate.  [employer] PT [phone]

## 2011-08-26 NOTE — Progress Notes (Signed)
Patient ID: Gabriel Marsh, male   DOB: Nov 05, 1930, 78 y.o.   MRN: [id number] TCTS DAILY PROGRESS NOTE                   [address]            [place] [address]          [phone]      1 Day Post-Op Procedure(s) (LRB): CORONARY ARTERY BYPASS GRAFTING (CABG) (N/A) AORTIC VALVE REPLACEMENT (AVR) (N/A)  Total Length of Stay:  LOS: 3 days   Subjective: Awake on vent neuro intact following commands  Objective: Vital signs in last 24 hours: Temp:  [99.1 F (37.3 C)-101.3 F (38.5 C)] 100 F (37.8 C) (08/02 1100) Pulse Rate:  [44-161] 47  (08/02 1100) Cardiac Rhythm:  [-] Normal sinus rhythm (08/02 1100) Resp:  [0-23] 0  (08/02 1100) BP: (77-128)/(40-87) 115/72 mmHg (08/02 1100) SpO2:  [97 %-100 %] 100 % (08/02 1100) Arterial Line BP: (87-124)/(36-64) 111/58 mmHg (08/02 1100) FiO2 (%):  [40 %-50 %] 40 % (08/02 1100) Weight:  [206 lb 2.1 oz (93.5 kg)] 206 lb 2.1 oz (93.5 kg) (08/02 0545)  Filed Weights   08/24/11 0100 08/25/11 0500 08/26/11 0545  Weight: 173 lb 15.1 oz (78.9 kg) 179 lb 7.3 oz (81.4 kg) 206 lb 2.1 oz (93.5 kg)    Weight change: 26 lb 10.8 oz (12.1 kg)   Hemodynamic parameters for last 24 hours: PAP: (31-44)/(20-28) 33/21 mmHg CO:  [3.1 L/min-5.1 L/min] 3.2 L/min CI:  [1.5 L/min/m2-2.6 L/min/m2] 1.6 L/min/m2  Intake/Output from previous day: 08/01 0701 - 08/02 0700 In: 9081.3 [I.V.:6064.3; Blood:517; NG/GT:200; IV Piggyback:2300] Out: 4615 [Urine:1495; Emesis/NG output:50; Blood:2400; Chest Tube:670]  Intake/Output this shift: Total I/O In: 641.1 [I.V.:591.1; IV Piggyback:50] Out: 185 [Urine:145; Chest Tube:40]  Current Meds: Scheduled Meds:   . acetaminophen (TYLENOL) oral liquid 160 mg/5 mL  650 mg Per Tube NOW  . acetaminophen  1,000 mg Oral Q6H   Or  . acetaminophen (TYLENOL) oral liquid 160 mg/5 mL  975 mg Per Tube Q6H  . acetaminophen  650 mg Rectal NOW  . antiseptic oral rinse  15 mL Mouth Rinse QID  . aspirin EC  325  mg Oral Daily   Or  . aspirin  324 mg Per Tube Daily  . bisacodyl  10 mg Oral Daily   Or  . bisacodyl  10 mg Rectal Daily  . chlorhexidine  15 mL Mouth Rinse BID  . dextrose      . docusate sodium  200 mg Oral Daily  . famotidine (PEPCID) IV  20 mg Intravenous BID  . insulin aspart  0-24 Units Subcutaneous Q2H   Followed by  . insulin aspart  0-24 Units Subcutaneous Q4H  . insulin regular  0-10 Units Intravenous TID WC  . magnesium sulfate  4 g Intravenous Once  . metoprolol tartrate  12.5 mg Oral BID   Or  . metoprolol tartrate  12.5 mg Per Tube BID  . moxifloxacin  400 mg Intravenous Once  . pantoprazole  40 mg Oral Q1200  . potassium chloride  10 mEq Intravenous Q1 Hr x 3  . simvastatin  5 mg Oral q1800  . sodium chloride  3 mL Intravenous Q12H  . vancomycin  1,000 mg Intravenous Once   Continuous Infusions:   . sodium chloride Stopped (08/25/11 1530)  . sodium chloride Stopped (08/25/11 1530)  . sodium chloride    . dexmedetomidine 0.5 mcg/kg/hr (08/26/11 1100)  .  DOPamine 10 mcg/kg/min (08/26/11 1100)  . insulin (NOVOLIN-R) infusion Stopped (08/25/11 1530)  . lactated ringers 60 mL (08/25/11 1530)  . milrinone Stopped (08/25/11 1828)  . nitroGLYCERIN Stopped (08/25/11 1530)  . phenylephrine (NEO-SYNEPHRINE) Adult infusion 65 mcg/min (08/26/11 1100)   PRN Meds:.albumin human, dextrose, lactated ringers, metoprolol, midazolam, morphine injection, morphine injection, ondansetron (ZOFRAN) IV, oxyCODONE, sodium chloride, DISCONTD: sodium chloride, DISCONTD: sodium chloride, DISCONTD: 0.9 % irrigation (POUR BTL), DISCONTD: 0.9 % irrigation (POUR BTL), DISCONTD: acetaminophen, DISCONTD: albumin human, DISCONTD: ALPRAZolam, DISCONTD: hemostatic agents, DISCONTD: hemostatic agents, DISCONTD: lactated ringers DISCONTD: metoprolol, DISCONTD: midazolam, DISCONTD:  morphine injection, DISCONTD:  morphine injection, DISCONTD: ondansetron (ZOFRAN) IV, DISCONTD: ondansetron (ZOFRAN) IV,  DISCONTD: oxyCODONE, DISCONTD: sodium chloride, DISCONTD: sodium chloride, DISCONTD: Surgifoam 1 Gm with 0.9% sodium chloride (4 ml) topical solution, DISCONTD: zolpidem  General appearance: alert, cooperative and no distress Neurologic: intact Heart: regular rate and rhythm, S1, S2 normal, no murmur, click, rub or gallop Lungs: diminished breath sounds bibasilar Abdomen: soft, non-tender; bowel sounds normal; no masses,  no organomegaly Extremities: thin skin with tears present preop Wound: sternum stable  Lab Results: CBC: Basename 08/26/11 0345 08/25/11 2018 08/25/11 2015  WBC 12.9* -- 13.9*  HGB 9.9* 9.5* --  HCT 30.1* 28.0* --  PLT 141* -- 134*   BMET:  Basename 08/26/11 0345 08/25/11 2018 08/24/11 2048  NA 137 138 --  K 4.6 4.8 --  CL 109 109 --  CO2 20 -- 26  GLUCOSE 153* 126* --  BUN 27* 25* --  CREATININE 0.71 0.60 --  CALCIUM 8.0* -- 8.8    PT/INR:  Basename 08/25/11 1531  LABPROT 22.0*  INR 1.89*   Radiology: Dg Chest Portable 1 View In Am  08/26/2011  *RADIOLOGY REPORT*  Clinical Data: Coronary artery bypass graft.  Aortic valve replacement.  PORTABLE CHEST - 1 VIEW  Comparison: 08/25/2011  Findings: Endotracheal tube 4.0 cm above carina.  Nasogastric extends beyond the  inferior aspect of the film.  Left IJ Swan-Ganz catheter unchanged in position.  Right IJ central line with tip in mid SVC.  A left-sided chest tube is unchanged position.  Intra-aortic balloon pump unchanged and appropriate in position.  Mild cardiomegaly.  Trace layering left pleural fluid. No pneumothorax.  Improved to resolved interstitial edema.  Improved right base atelectasis.  Persistent patchy left base air space disease.  IMPRESSION:  1.  Appropriate position of support apparatus, without pneumothorax. 2.  Improved to resolved congestive heart failure with resolved right base atelectasis. 3.  Residual small left pleural effusion with adjacent atelectasis.  Original Report Authenticated By: Consuello Bossier, M.D.   Dg Chest Portable 1 View  08/25/2011  *RADIOLOGY REPORT*  Clinical Data: CABG.  Valve replacement.  PORTABLE CHEST - 1 VIEW  Comparison: 08/24/2011  Findings: Changes of CABG and valve replacement.  Intra-aortic balloon pump remains present in the proximal descending thoracic aorta in stable position.  Right central line tip is stable. Endotracheal tube is approximately 3 cm above the carina.  Theone Murdoch catheter is in the right central pulmonary artery.  Left chest tube in place without pneumothorax.  Cardiomegaly with vascular congestion and mild pulmonary edema, progressed since prior study.  IMPRESSION: Support devices as above in expected position.  No pneumothorax. Postoperative changes with mild pulmonary edema.  Original Report Authenticated By: Cyndie Chime, M.D.   Dg Chest Portable 1 View  08/24/2011  *RADIOLOGY REPORT*  Clinical Data: Confirm central line placement  PORTABLE CHEST - 1  VIEW  Comparison: Prior radiographs obtained earlier today, 08/24/2011 at 07:33 a.m.  Findings: Interval placement of a right IJ approach central venous catheter.  The catheter tip projects over the mid superior vena cava.  Additionally, the metallic marker of an intra-aortic balloon pump appears to have been advanced.  He is now 3 cm below the top of the aortic arch compared to 4.7 cm on the prior chest x-ray.  Improving pulmonary edema with decreased interstitial and airspace opacities.  Cardiomegaly appears unchanged.  IMPRESSION:  1.  Interval placement of a right IJ approach central venous catheter.  The catheter tip projects over the mid SVC.  No evidence of complicating pneumothorax. 2.  Improving pulmonary edema. 3.  Unchanged cardiomegaly 4.  The intra-aortic balloon pump appears to have advanced slightly.  The tip is now 3 cm below the top of the aortic arch compared to 4.7 cm on the prior study.  Original Report Authenticated By: Vilma Prader     Assessment/Plan: S/P Procedure(s)  (LRB): CORONARY ARTERY BYPASS GRAFTING (CABG) (N/A) AORTIC VALVE REPLACEMENT (AVR) (N/A) Diabetes control Continue foley due to acute urinary retention, strict I&O and patient critically ill still needing pressor suport will wait on removel of IAB another 12-24 hours then work on weaning vent Will ask critical care to get back involved with the patient Up dated niece on his situation   Delight Ovens MD  Beeper [phone] Office [phone] 08/26/2011 12:13 PM

## 2011-08-26 NOTE — Consult Note (Signed)
Name: Gabriel Marsh MRN: [id number] DOB: 1930/07/04    LOS: 3  Referring Provider:  CVTS Reason for Referral:  Vent Management  PULMONARY / CRITICAL CARE MEDICINE  HPI:  78 year old male with PMH of HTN and MR/AS presenting to PCCM post op for a CABG and AVR.  The patient was operated on on 8/1.  Post op had significant hypotension and was kept on pressors with an IABP.    Past Medical History  Diagnosis Date  . Hypertension   . Hyperlipidemia   . Mitral regurgitation     Moderate by 08/20/11 echo  . Shortness of breath   . CAD (coronary artery disease)     a) 08/22/11 cath :  50-60% left main stenosis, mild-moderate LAD disease, 99% ostial large diagonal stenosis, occluded and collateralized LCx and RCA  . Aortic stenosis     Severe by 08/20/11 echo  . Aortic insufficiency     Moderate by 08/20/11 echo  . Chronic systolic CHF (congestive heart failure)     a) 08/20/11 echo : LVEF 35-40%   Past Surgical History  Procedure Date  . Eye surgery   . Cardiac catheterization 08/22/11     50-60% left main stenosis, mild-moderate LAD disease, 99% ostial large diagonal stenosis, occluded and collateralized LCx and RCA   Prior to Admission medications   Medication Sig Start Date End Date Taking? Authorizing Provider  aspirin 81 MG tablet Take 160 mg by mouth daily.   Yes Historical Provider, MD   Allergies Allergies  Allergen Reactions  . Fish Allergy   . Lisinopril     cough  . Penicillins     Hives   . Statins     Leg cramps  . Sulfa Antibiotics     Hives     Family History Family History  Problem Relation Age of Onset  . Heart disease Brother    Social History  reports that he quit smoking about 16 years ago. He does not have any smokeless tobacco history on file. He reports that he does not drink alcohol or use illicit drugs.  Review Of Systems:  Unattainable, patient is sedated and intubated.  Current Status: Critical  Vital Signs: Temp:  [99.1 F (37.3  C)-101.3 F (38.5 C)] 100.2 F (37.9 C) (08/02 0700) Pulse Rate:  [44-161] 91  (08/02 0833) Resp:  [2-23] 18  (08/02 0833) BP: (77-128)/(40-87) 112/62 mmHg (08/02 0833) SpO2:  [97 %-100 %] 100 % (08/02 0833) Arterial Line BP: (87-124)/(36-64) 106/59 mmHg (08/02 0700) FiO2 (%):  [40 %-50 %] 50 % (08/02 0833) Weight:  [93.5 kg (206 lb 2.1 oz)] 93.5 kg (206 lb 2.1 oz) (08/02 0545)  Physical Examination: General:  Sedated and intubated male, withdraws to painful stimuli, arousable. Neuro:  Sedated and intubated. HEENT:  [place]/AT, PERRL, EOM-I and MMM. Neck:  Supple, -LAN and -thyromegally. Cardiovascular:  RRR, Nl S1/S2, -M/R/G. Lungs:  Coarse BS diffusely. Abdomen:  Soft, NT, ND and +BS. Musculoskeletal:  -edema and -tenderness. Skin:  Intact, wound appears clean.  Principal Problem:  *Severe aortic stenosis Active Problems:  CAD (coronary artery disease), native coronary artery  Hypertension  Hyperlipidemia  Chronic systolic CHF (congestive heart failure)  Moderate mitral regurgitation  Moderate aortic insufficiency  Cardiogenic shock  NSTEMI (non-ST elevated myocardial infarction)   ASSESSMENT AND PLAN  PULMONARY  Lab 08/26/11 0347 08/25/11 1526 08/25/11 1046 08/24/11 2114  PHART 7.379 7.387 7.435 7.427  PCO2ART 36.9 36.2 36.3 37.1  PO2ART 195.0* 100.0 338.0* 57.0*  HCO3 21.5 21.7 24.4* 24.5*  O2SAT 100.0 98.0 100.0 91.0   Ventilator Settings: Vent Mode:  [-] PRVC FiO2 (%):  [40 %-50 %] 50 % Set Rate:  [4 bmp-18 bmp] 18 bmp Vt Set:  [550 mL-650 mL] 550 mL PEEP:  [5 cmH20] 5 cmH20 Pressure Support:  [10 cmH20] 10 cmH20 Plateau Pressure:  [15 cmH20-23 cmH20] 21 cmH20 CXR:  Noted ETT:  8/1>>>  A:  VDRF post op with adequate gas exchange. P:   - Change to PRVC. - No extubation until IABP is out (index dropped when IABP was reduced, currently on 2:1). - F/U ABG and CXR. - If IABP is out will begin weaning in AM or later today depending on clinical  course.  CARDIOVASCULAR  Lab 08/24/11 0415 08/23/11 2122 08/23/11 1620  TROPONINI 9.61* 1.51* 1.84*  LATICACIDVEN -- -- --  PROBNP -- -- --   Lines: R IJ TLC 7/30>>>  L IJ Swan 8/1>>>  A: Hypotension, cardiogenic shock. P:  IABP. Neosynephrin. Wean IABP.  RENAL  Lab 08/26/11 0345 08/25/11 2018 08/25/11 2015 08/25/11 1526 08/25/11 1407 08/25/11 1257 08/24/11 2048 08/24/11 1600 08/24/11 0400 08/23/11 1620  NA 137 138 -- 141 142 139 -- -- -- --  K 4.6 4.8 -- -- -- -- -- -- -- --  CL 109 109 -- -- -- -- 102 103 105 --  CO2 20 -- -- -- -- -- 26 26 26 26   BUN 27* 25* -- -- -- -- 35* 36* 34* --  CREATININE 0.71 0.60 0.68 -- -- -- 0.72 0.80 -- --  CALCIUM 8.0* -- -- -- -- -- 8.8 8.8 8.9 9.1  MG 3.1* -- 3.4* -- -- -- -- -- 2.5 --  PHOS -- -- -- -- -- -- -- -- -- --   Intake/Output      08/01 0701 - 08/02 0700 08/02 0701 - 08/03 0700   P.O.     I.V. (mL/kg) 6064.3 (64.9) 133.4 (1.4)   Blood 517    NG/GT 200    IV Piggyback 2300    Total Intake(mL/kg) 9081.3 (97.1) 133.4 (1.4)   Urine (mL/kg/hr) 1495 (0.7) 35   Emesis/NG output 50    Blood 2400    Chest Tube 670 10   Total Output 4615 45   Net +4466.3 +88.4         Foley:  In place  A:  No active issues. P:   Monitor I/O. Replace electrolytes as needed.  GASTROINTESTINAL  Lab 08/24/11 2048  AST 165*  ALT 39  ALKPHOS 57  BILITOT 0.4  PROT 6.5  ALBUMIN 3.0*    A:  No active issues. P:   NPO for now.  HEMATOLOGIC  Lab 08/26/11 0345 08/25/11 2018 08/25/11 2015 08/25/11 1531 08/25/11 1526 08/25/11 1256 08/25/11 0415 08/24/11 2048  HGB 9.9* 9.5* 10.0* 8.7* 8.8* -- -- --  HCT 30.1* 28.0* 30.2* 26.0* 26.0* -- -- --  PLT 141* -- 134* 96* -- 137* 194 --  INR -- -- -- 1.89* -- -- -- --  APTT -- -- -- 43* -- -- -- 75*   A:  No active issues. P:  Daily CBC. Monitor WBC.  INFECTIOUS  Lab 08/26/11 0345 08/25/11 2015 08/25/11 1531 08/25/11 0415 08/24/11 1600  WBC 12.9* 13.9* 13.4* 10.1 11.9*  PROCALCITON --  -- -- -- --   Cultures: None Antibiotics: Prophylactic abx.  A:  No active infection. P:   Monitor fever curve.  ENDOCRINE  Lab 08/26/11 0754 08/26/11  0345 08/25/11 2340 08/25/11 2201 08/25/11 1736  GLUCAP 99 141* 119* 117* 72   A:  No diabetes history.   P:   Check cortisol level. Stress dose steroids. Monitor BS.  NEUROLOGIC  A:  Arousable. P:   Precedex and fentanyl PRN.  If not weanable will reconsider sedation but patient appears weanable for now.  CC time 40 min.  Koren Bound, M.D. Pulmonary and Critical Care Medicine [facility] Pager: [phone]  08/26/2011, 8:42 AM

## 2011-08-27 ENCOUNTER — Inpatient Hospital Stay (HOSPITAL_COMMUNITY): Payer: Medicare Other

## 2011-08-27 LAB — BASIC METABOLIC PANEL
BUN: 23 mg/dL (ref 6–23)
Calcium: 7.8 mg/dL — ABNORMAL LOW (ref 8.4–10.5)
GFR calc Af Amer: >90 mL/min (ref >90)
GFR calc non Af Amer: >90 mL/min (ref >90)
Glucose, Bld: 135 mg/dL — ABNORMAL HIGH (ref 70–99)
Potassium: 4.3 mEq/L (ref 3.5–5.1)
Sodium: 135 mEq/L (ref 135–145)

## 2011-08-27 LAB — POCT I-STAT 3, ART BLOOD GAS (G3+)
Bicarbonate: 19.7 mEq/L — ABNORMAL LOW (ref 20.0–24.0)
O2 Saturation: 99 %
TCO2: 21 mmol/L (ref 0–100)
pCO2 arterial: 30.2 mmHg — ABNORMAL LOW (ref 35.0–45.0)
pCO2 arterial: 32.2 mmHg — ABNORMAL LOW (ref 35.0–45.0)
pH, Arterial: 7.429 (ref 7.350–7.450)
pH, Arterial: 7.442 (ref 7.350–7.450)
pO2, Arterial: 135 mmHg — ABNORMAL HIGH (ref 80.0–100.0)

## 2011-08-27 LAB — CBC
HCT: 29.4 % — ABNORMAL LOW (ref 39.0–52.0)
HCT: 27.5 % — ABNORMAL LOW (ref 39.0–52.0)
Hemoglobin: 9.8 g/dL — ABNORMAL LOW (ref 13.0–17.0)
Hemoglobin: 9.3 g/dL — ABNORMAL LOW (ref 13.0–17.0)
MCH: 29.6 pg (ref 26.0–34.0)
MCH: 29.9 pg (ref 26.0–34.0)
MCHC: 33.3 g/dL (ref 30.0–36.0)
MCHC: 33.8 g/dL (ref 30.0–36.0)
MCV: 88.4 fL (ref 78.0–100.0)
Platelets: 128 10*3/uL — ABNORMAL LOW (ref 150–400)
RBC: 3.11 MIL/uL — ABNORMAL LOW (ref 4.22–5.81)
RDW: 14 % (ref 11.5–15.5)
RDW: 13.8 % (ref 11.5–15.5)
WBC: 16 10*3/uL — ABNORMAL HIGH (ref 4.0–10.5)

## 2011-08-27 LAB — GLUCOSE, CAPILLARY
Glucose-Capillary: 113 mg/dL — ABNORMAL HIGH (ref 70–99)
Glucose-Capillary: 125 mg/dL — ABNORMAL HIGH (ref 70–99)

## 2011-08-27 MED ORDER — ALBUMIN HUMAN 5 % IV SOLN
INTRAVENOUS | Status: AC
  Administered 2011-08-27: 250 mL via INTRAVENOUS
  Filled 2011-08-27: qty 250

## 2011-08-27 MED ORDER — AMIODARONE HCL IN DEXTROSE 360-4.14 MG/200ML-% IV SOLN
60.0000 mg/h | INTRAVENOUS | Status: AC
  Administered 2011-08-27: 60 mg/h via INTRAVENOUS
  Filled 2011-08-27: qty 200

## 2011-08-27 MED ORDER — AMIODARONE HCL IN DEXTROSE 360-4.14 MG/200ML-% IV SOLN
INTRAVENOUS | Status: AC
  Administered 2011-08-27: 150 mg via INTRAVENOUS
  Filled 2011-08-27: qty 200

## 2011-08-27 MED ORDER — ACETAMINOPHEN 10 MG/ML IV SOLN: 1000.0000 mg | Freq: Four times a day (QID) | INTRAVENOUS | Status: DC

## 2011-08-27 MED ORDER — ALBUMIN HUMAN 5 % IV SOLN
12.5000 g | Freq: Once | INTRAVENOUS | Status: AC
  Administered 2011-08-27: 250 mL via INTRAVENOUS

## 2011-08-27 MED ORDER — ACETAMINOPHEN 10 MG/ML IV SOLN
1000.0000 mg | Freq: Four times a day (QID) | INTRAVENOUS | Status: AC
  Administered 2011-08-27 – 2011-08-28 (×4): 1000 mg via INTRAVENOUS
  Filled 2011-08-27 (×4): qty 100

## 2011-08-27 MED ORDER — AMIODARONE LOAD VIA INFUSION
150.0000 mg | Freq: Once | INTRAVENOUS | Status: AC
  Administered 2011-08-27: 150 mg via INTRAVENOUS
  Filled 2011-08-27: qty 83.34

## 2011-08-27 MED ORDER — AMIODARONE HCL IN DEXTROSE 360-4.14 MG/200ML-% IV SOLN
30.0000 mg/h | INTRAVENOUS | Status: DC
  Administered 2011-08-27: 30 mg/h via INTRAVENOUS
  Filled 2011-08-27 (×3): qty 200

## 2011-08-27 MED ORDER — CALCIUM CHLORIDE 10 % IV SOLN
1.0000 g | Freq: Once | INTRAVENOUS | Status: AC
  Administered 2011-08-27: 1 g via INTRAVENOUS

## 2011-08-27 NOTE — H&P (Signed)
The patient was seen and examined.  He presents with progressive shortness of breath associated with flash pulmonary edema.  Evaluation revealed significant CAD and aortic stenosis.  He is followed by Dr. Patty Sermons, and has been transferred for surgical care.  The surgeons are aware of his presence, and his films have been uploaded on the server.  They will see him in consult and plans will be made for surgical intervention.

## 2011-08-27 NOTE — Progress Notes (Signed)
2 Days Post-Op Procedure(s) (LRB): CORONARY ARTERY BYPASS GRAFTING (CABG) (N/A) AORTIC VALVE REPLACEMENT (AVR) (N/A) Subjective: Status post CABG aVR preoperative cardiogenic shock with a balloon pump Currently intubated neural contact hemodynamic stable Balloon pump wean 1-2 than 1-3 and DC'd this a.m. by CCU sheath team Urine output adequate Plan vent wean per cc M.  Objective: Vital signs in last 24 hours: Temp:  [100 F (37.8 C)-101.1 F (38.4 C)] 100.6 F (38.1 C) (08/03 1000) Pulse Rate:  [45-108] 98  (08/03 1000) Cardiac Rhythm:  [-] Heart block (08/03 0800) Resp:  [0-21] 21  (08/03 1000) BP: (81-123)/(48-86) 108/51 mmHg (08/03 1000) SpO2:  [100 %] 100 % (08/03 1000) Arterial Line BP: (89-119)/(48-96) 119/55 mmHg (08/03 1000) FiO2 (%):  [40 %] 40 % (08/03 0905) Weight:  [212 lb 1.3 oz (96.2 kg)] 212 lb 1.3 oz (96.2 kg) (08/03 0545)  Hemodynamic parameters for last 24 hours: PAP: (25-38)/(14-25) 27/16 mmHg CO:  [3.2 L/min-4.2 L/min] 4.2 L/min CI:  [1.6 L/min/m2-2.1 L/min/m2] 2.1 L/min/m2  Intake/Output from previous day: 08/02 0701 - 08/03 0700 In: 3187.6 [I.V.:2927.6; NG/GT:210; IV Piggyback:50] Out: 1205 [Urine:795; Emesis/NG output:150; Chest Tube:260] Intake/Output this shift: Total I/O In: 333.5 [I.V.:303.5; NG/GT:30] Out: 210 [Urine:150; Chest Tube:60]  Breath sounds distant Extremities cool but with Doppler pulses Moves all extremities  Lab Results:  Basename 08/27/11 0409 08/26/11 1721 08/26/11 1700  WBC 12.7* -- 13.1*  HGB 9.8* 10.2* --  HCT 29.4* 30.0* --  PLT 135* -- 132*   BMET:  Basename 08/27/11 0409 08/26/11 1721 08/26/11 0345  NA 135 137 --  K 4.3 4.3 --  CL 107 107 --  CO2 20 -- 20  GLUCOSE 135* 142* --  BUN 23 24* --  CREATININE 0.58 0.70 --  CALCIUM 7.8* -- 8.0*    PT/INR:  Basename 08/25/11 1531  LABPROT 22.0*  INR 1.89*   ABG    Component Value Date/Time   PHART 7.429 08/27/2011 0413   HCO3 19.7* 08/27/2011 0413   TCO2 21  08/27/2011 0413   ACIDBASEDEF 3.0* 08/27/2011 0413   O2SAT 99.0 08/27/2011 0413   CBG (last 3)   Basename 08/27/11 0752 08/27/11 0412 08/26/11 2348  GLUCAP 113* 115* 108*    Assessment/Plan: S/P Procedure(s) (LRB): CORONARY ARTERY BYPASS GRAFTING (CABG) (N/A) AORTIC VALVE REPLACEMENT (AVR) (N/A) Continue dopamine and neo- Ventilator wean per cc M. Followup sputum culture through ET tube, white count 13,000   LOS: 4 days    VAN TRIGT III,PETER 08/27/2011

## 2011-08-27 NOTE — Progress Notes (Signed)
Call from RN, okay to wean pt on standard wean protocol per order. Pt currently on PSV 40% 5/5. Pt resting comfortably at this time, vitals are WNL and pt is achieving good volumes while weaning. RT will continue to monitor pt and adjust settings as needed.

## 2011-08-27 NOTE — Significant Event (Signed)
PM follow up. Remains on 5/5 ps/cpap. IABP has been removed.  Awake and alert. HOH but follows commands. Remains on dopamine and neo. Cardiac index now 2.0 Lungs :CTA Card: HSR BP 92/58  Pulse 96  Temp 99.7 F (37.6 C) (Core (Comment))  Resp 26  Ht 5\' 10"  (1.778 m)  Wt 212 lb 1.3 oz (96.2 kg)  BMI 30.43 kg/m2  SpO2 100%  Lab 08/27/11 0409 08/26/11 1721 08/26/11 1700 08/26/11 0345 08/24/11 2048  NA 135 137 -- 137 --  K 4.3 4.3 -- 4.6 --  CL 107 107 -- 109 --  CO2 20 -- -- 20 26  BUN 23 24* -- 27* --  CREATININE 0.58 0.70 0.64 -- --  GLUCOSE 135* 142* -- 153* --    Lab 08/27/11 0409 08/26/11 1721 08/26/11 1700 08/26/11 0345  HGB 9.8* 10.2* 9.7* --  HCT 29.4* 30.0* 29.4* --  WBC 12.7* -- 13.1* 12.9*  PLT 135* -- 132* 141*    ABG    Component Value Date/Time   PHART 7.442 08/27/2011 1550   PCO2ART 32.2* 08/27/2011 1550   PO2ART 139.0* 08/27/2011 1550   HCO3 21.9 08/27/2011 1550   TCO2 23 08/27/2011 1550   ACIDBASEDEF 2.0 08/27/2011 1550   O2SAT 99.0 08/27/2011 1550   We will extubate and monitor closely. Brett Canales Minor ACNP [facility] Pager [phone] till 3 pm If no answer page [phone] 08/27/2011, 3:55 PM  Patient seen and examined, agree with above note.  I dictated the care and orders written for this patient under my direction.  Koren Bound, M.D. [phone]

## 2011-08-27 NOTE — Consult Note (Addendum)
Name: Gabriel Marsh MRN: [id number] DOB: May 20, 1930    LOS: 4  Referring Provider:  CVTS Reason for Referral:  Vent Management  PULMONARY / CRITICAL CARE MEDICINE  HPI:  78 year old male with PMH of HTN and MR/AS presenting to PCCM post op for a CABG and AVR.  The patient was operated on on 8/1.  Post op had significant hypotension and was kept on pressors with an IABP.     Current Status: Critical  Vital Signs: Temp:  [100 F (37.8 C)-101.1 F (38.4 C)] 100.8 F (38.2 C) (08/03 0630) Pulse Rate:  [45-108] 95  (08/03 0630) Resp:  [0-22] 18  (08/03 0630) BP: (81-119)/(48-86) 92/58 mmHg (08/03 0630) SpO2:  [100 %] 100 % (08/03 0630) Arterial Line BP: (89-119)/(48-96) 110/54 mmHg (08/03 0630) FiO2 (%):  [40 %-50 %] 40 % (08/03 0342) Weight:  [212 lb 1.3 oz (96.2 kg)] 212 lb 1.3 oz (96.2 kg) (08/03 0545)  Physical Examination: General:  Sedated and intubated male, follows commands Neuro:  Sedated and intubated. HEENT:  [place]/AT, PERRL, EOM-I and MMM. Neck:  Supple, -LAN and -thyromegally. Cardiovascular:  RRR, Nl S1/S2, -M/R/G.IABP 1:3. CI 1.8 on IABP Lungs:  Coarse BS diffusely. Chest tubes x 4, no overt bleeding or air leak Abdomen:  Soft, NT, ND and +BS. Musculoskeletal:  +edema and -tenderness. Skin:  Intact, wound appears clean.  Principal Problem:  *Severe aortic stenosis Active Problems:  CAD (coronary artery disease), native coronary artery  Hypertension  Hyperlipidemia  Chronic systolic CHF (congestive heart failure)  Moderate mitral regurgitation  Moderate aortic insufficiency  Cardiogenic shock  NSTEMI (non-ST elevated myocardial infarction)  Acute respiratory failure   ASSESSMENT AND PLAN  PULMONARY  Lab 08/27/11 0413 08/26/11 0934 08/26/11 0347 08/25/11 1526 08/25/11 1046  PHART 7.429 7.420 7.379 7.387 7.435  PCO2ART 30.2* 32.3* 36.9 36.2 36.3  PO2ART 135.0* 118.0* 195.0* 100.0 338.0*  HCO3 19.7* 20.8 21.5 21.7 24.4*  O2SAT 99.0 99.0 100.0 98.0 100.0    Ventilator Settings: Vent Mode:  [-] PRVC FiO2 (%):  [40 %-50 %] 40 % Set Rate:  [18 bmp] 18 bmp Vt Set:  [550 mL] 550 mL PEEP:  [5 cmH20] 5 cmH20 Pressure Support:  [10 cmH20] 10 cmH20 Plateau Pressure:  [20 cmH20-21 cmH20] 21 cmH20 CXR:  8/3 nsc  ETT:  8/1>>>  A:  VDRF post op with adequate gas exchange. P:   - Changed to PRVC. - No extubation until IABP is out (index dropped when IABP was reduced, currently on 1:3). - F/U ABG and CXR. - If IABP is out will begin weaning (note ci 1.8 on iabp 8/3 which precludes extubation)  CARDIOVASCULAR  Lab 08/24/11 0415 08/23/11 2122 08/23/11 1620  TROPONINI 9.61* 1.51* 1.84*  LATICACIDVEN -- -- --  PROBNP -- -- --   Lines: R IJ TLC 7/30>>>  L IJ Swan 8/1>>>  A: Hypotension, cardiogenic shock. P:  IABP. Pressors as needed Wean IABP.  RENAL  Lab 08/27/11 0409 08/26/11 1721 08/26/11 1700 08/26/11 0345 08/25/11 2018 08/25/11 2015 08/25/11 1526 08/24/11 2048 08/24/11 1600 08/24/11 0400  NA 135 137 -- 137 138 -- 141 -- -- --  K 4.3 4.3 -- -- -- -- -- -- -- --  CL 107 107 -- 109 109 -- -- 102 -- --  CO2 20 -- -- 20 -- -- -- 26 26 26   BUN 23 24* -- 27* 25* -- -- 35* -- --  CREATININE 0.58 0.70 0.64 0.71 0.60 -- -- -- -- --  CALCIUM 7.8* -- -- 8.0* -- -- -- 8.8 8.8 8.9  MG 2.7* -- 2.9* 3.1* -- 3.4* -- -- -- 2.5  PHOS 1.8* -- -- -- -- -- -- -- -- --   Intake/Output      08/02 0701 - 08/03 0700 08/03 0701 - 08/04 0700   I.V. (mL/kg) 2807.6 (29.2)    Blood     NG/GT 210    IV Piggyback 50    Total Intake(mL/kg) 3067.6 (31.9)    Urine (mL/kg/hr) 755 (0.3)    Emesis/NG output 150    Blood     Chest Tube 240    Total Output 1145    Net +1922.6          Foley:  In place  A:  No active issues. P:   Monitor I/O. Replace electrolytes as needed.  GASTROINTESTINAL  Lab 08/24/11 2048  AST 165*  ALT 39  ALKPHOS 57  BILITOT 0.4  PROT 6.5  ALBUMIN 3.0*    A:  No active issues. P:   NPO for  now.  HEMATOLOGIC  Lab 08/27/11 0409 08/26/11 1721 08/26/11 1700 08/26/11 0345 08/25/11 2018 08/25/11 2015 08/25/11 1531 08/24/11 2048  HGB 9.8* 10.2* 9.7* 9.9* 9.5* -- -- --  HCT 29.4* 30.0* 29.4* 30.1* 28.0* -- -- --  PLT 135* -- 132* 141* -- 134* 96* --  INR -- -- -- -- -- -- 1.89* --  APTT -- -- -- -- -- -- 43* 75*   A:  No active issues. P:  Daily CBC. Monitor WBC.  INFECTIOUS  Lab 08/27/11 0409 08/26/11 1700 08/26/11 0345 08/25/11 2015 08/25/11 1531  WBC 12.7* 13.1* 12.9* 13.9* 13.4*  PROCALCITON -- -- -- -- --   Cultures: None Antibiotics: none  A:  No active infection. P:   Monitor fever curve.  ENDOCRINE  Lab 08/27/11 0412 08/26/11 2348 08/26/11 1923 08/26/11 1540 08/26/11 1150  GLUCAP 115* 108* 121* 135* 111*   A:  No diabetes history.   P:   Check cortisol level. Stress dose steroids. Monitor BS.  NEUROLOGIC  A:  Arousable. P:   Precedex and fentanyl PRN.    Brett Canales Minor ACNP [facility] Pager [phone] till 3 pm If no answer page [phone] 08/27/2011, 7:43 AM  Will likely to be able to extubate today once able to sit up since balloon pump was just removed.  Patient weaning very well.  CC time 35 min.  Patient seen and examined, agree with above note.  I dictated the care and orders written for this patient under my direction.  Koren Bound, M.D. [phone]

## 2011-08-27 NOTE — Procedures (Signed)
Extubation Procedure Note  Patient Details:   Name: Gabriel Marsh DOB: 05-30-1930 MRN: [id number]   Airway Documentation:     Evaluation  O2 sats: stable throughout Complications: No apparent complications Patient did tolerate procedure well. Bilateral Breath Sounds: Clear Suctioning: Airway Yes  Pt tolerated wean, was positive for cuff leak, and extubated to 4lpm [place] per order. Pt achieved x10 and no strider was heard. Pt resting comfortably at this time, and vitals are WNL. Pt given flutter and encouraged to deep breath and cough. RT will continue to monitor.   Parke Poisson 08/27/2011, 4:33 PM

## 2011-08-28 ENCOUNTER — Inpatient Hospital Stay (HOSPITAL_COMMUNITY): Payer: Medicare Other

## 2011-08-28 LAB — GLUCOSE, CAPILLARY
Glucose-Capillary: 104 mg/dL — ABNORMAL HIGH (ref 70–99)
Glucose-Capillary: 111 mg/dL — ABNORMAL HIGH (ref 70–99)
Glucose-Capillary: 122 mg/dL — ABNORMAL HIGH (ref 70–99)
Glucose-Capillary: 124 mg/dL — ABNORMAL HIGH (ref 70–99)
Glucose-Capillary: 74 mg/dL (ref 70–99)
Glucose-Capillary: 92 mg/dL (ref 70–99)

## 2011-08-28 LAB — CBC
Platelets: 144 10*3/uL — ABNORMAL LOW (ref 150–400)
RDW: 13.8 % (ref 11.5–15.5)
WBC: 14.8 10*3/uL — ABNORMAL HIGH (ref 4.0–10.5)

## 2011-08-28 LAB — BASIC METABOLIC PANEL
Calcium: 8.3 mg/dL — ABNORMAL LOW (ref 8.4–10.5)
GFR calc non Af Amer: >90 mL/min (ref >90)
Glucose, Bld: 129 mg/dL — ABNORMAL HIGH (ref 70–99)
Sodium: 133 mEq/L — ABNORMAL LOW (ref 135–145)

## 2011-08-28 LAB — POCT I-STAT 4, (NA,K, GLUC, HGB,HCT)
Glucose, Bld: 105 mg/dL — ABNORMAL HIGH (ref 70–99)
HCT: 27 % — ABNORMAL LOW (ref 39.0–52.0)
Hemoglobin: 9.2 g/dL — ABNORMAL LOW (ref 13.0–17.0)
Potassium: 3.9 meq/L (ref 3.5–5.1)
Sodium: 135 meq/L (ref 135–145)

## 2011-08-28 LAB — MAGNESIUM: Magnesium: 2.6 mg/dL — ABNORMAL HIGH (ref 1.5–2.5)

## 2011-08-28 MED ORDER — FUROSEMIDE 10 MG/ML IJ SOLN
20.0000 mg | Freq: Two times a day (BID) | INTRAMUSCULAR | Status: DC
  Administered 2011-08-28 (×2): 20 mg via INTRAVENOUS
  Filled 2011-08-28 (×4): qty 2

## 2011-08-28 MED ORDER — TRAMADOL HCL 50 MG PO TABS
50.0000 mg | ORAL_TABLET | Freq: Four times a day (QID) | ORAL | Status: DC | PRN
  Administered 2011-08-28 – 2011-09-01 (×3): 50 mg via ORAL
  Filled 2011-08-28 (×3): qty 1

## 2011-08-28 MED ORDER — ZOLPIDEM TARTRATE 5 MG PO TABS
5.0000 mg | ORAL_TABLET | Freq: Every evening | ORAL | Status: DC | PRN
  Administered 2011-08-28: 5 mg via ORAL
  Filled 2011-08-28: qty 1

## 2011-08-28 MED ORDER — AMIODARONE HCL 200 MG PO TABS
400.0000 mg | ORAL_TABLET | Freq: Two times a day (BID) | ORAL | Status: DC
  Administered 2011-08-28 – 2011-09-02 (×9): 400 mg via ORAL
  Filled 2011-08-28 (×13): qty 2

## 2011-08-28 MED ORDER — INSULIN ASPART 100 UNIT/ML ~~LOC~~ SOLN
0.0000 [IU] | SUBCUTANEOUS | Status: DC
  Administered 2011-08-28: 2 [IU] via SUBCUTANEOUS

## 2011-08-28 MED ORDER — FUROSEMIDE 10 MG/ML IJ SOLN: 20.0000 mg | Freq: Two times a day (BID) | INTRAMUSCULAR | Status: DC

## 2011-08-28 NOTE — Progress Notes (Signed)
3 Days Post-Op Procedure(s) (LRB): CORONARY ARTERY BYPASS GRAFTING (CABG) (N/A) AORTIC VALVE REPLACEMENT (AVR) (N/A) Subjective: AVR CABG with preoperative intra-aortic balloon pump Extubated and out of bed to chair Starting by mouth diet, physical therapy consult in place Starting diuresis and maintaining low-dose dopamine for reduced preoperative LV function  Objective: Vital signs in last 24 hours: Temp:  [97.5 F (36.4 C)-99.7 F (37.6 C)] 97.9 F (36.6 C) (08/04 1549) Pulse Rate:  [65-100] 67  (08/04 1800) Cardiac Rhythm:  [-] Normal sinus rhythm (08/04 1600) Resp:  [18-27] 23  (08/04 1800) BP: (85-128)/(25-77) 109/52 mmHg (08/04 1800) SpO2:  [91 %-100 %] 99 % (08/04 1800) Arterial Line BP: (77-151)/(45-86) 119/49 mmHg (08/04 1600) Weight:  [203 lb 14.8 oz (92.5 kg)] 203 lb 14.8 oz (92.5 kg) (08/04 0600)  Hemodynamic parameters for last 24 hours: PAP: (35-54)/(17-37) 39/23 mmHg CO:  [3.3 L/min-4 L/min] 3.3 L/min CI:  [1.7 L/min/m2-2 L/min/m2] 1.7 L/min/m2  Intake/Output from previous day: 08/03 0701 - 08/04 0700 In: 2964.7 [I.V.:2904.7; NG/GT:60] Out: 1170 [Urine:790; Chest Tube:380] Intake/Output this shift: Total I/O In: 754.3 [I.V.:748.3; IV Piggyback:6] Out: 870 [Urine:600; Chest Tube:270]  Alert and responsive Surgical incisions dry and clean Mild to moderate peripheral edema  Lab Results:  Basename 08/28/11 1732 08/28/11 0356 08/27/11 2000  WBC -- 14.8* 16.0*  HGB 9.2* 9.6* --  HCT 27.0* 28.4* --  PLT -- 144* 128*   BMET:  Basename 08/28/11 1732 08/28/11 0356 08/27/11 0409  NA 135 133* --  K 3.9 4.0 --  CL -- 103 107  CO2 -- 19 20  GLUCOSE 105* 129* --  BUN -- 30* 23  CREATININE -- 0.65 0.58  CALCIUM -- 8.3* 7.8*    PT/INR: No results found for this basename: LABPROT,INR in the last 72 hours ABG    Component Value Date/Time   PHART 7.442 08/27/2011 1550   HCO3 21.9 08/27/2011 1550   TCO2 23 08/27/2011 1550   ACIDBASEDEF 2.0 08/27/2011 1550   O2SAT 99.0 08/27/2011 1550   CBG (last 3)   Basename 08/28/11 1547 08/28/11 1159 08/28/11 0717  GLUCAP 92 124* 111*    Assessment/Plan: S/P Procedure(s) (LRB): CORONARY ARTERY BYPASS GRAFTING (CABG) (N/A) AORTIC VALVE REPLACEMENT (AVR) (N/A) See progression orders   LOS: 5 days    Gabriel Marsh,Gabriel Marsh 08/28/2011

## 2011-08-28 NOTE — Progress Notes (Signed)
Name: Gabriel Marsh MRN: [id number] DOB: [birth year]    LOS: 5  Referring Provider:  CVTS Reason for Referral:  Vent Management  PULMONARY / CRITICAL CARE MEDICINE  HPI:  78 year old male with PMH of HTN and MR/AS presenting to PCCM post op for a CABG and AVR.  The patient was operated on on 8/1.  Post op had significant hypotension and was kept on pressors with an IABP.     Current Status:Guarded  Vital Signs: Temp:  [97.5 F (36.4 C)-100.6 F (38.1 C)] 98.7 F (37.1 C) (08/04 0719) Pulse Rate:  [71-113] 81  (08/04 0715) Resp:  [18-28] 21  (08/04 0715) BP: (83-123)/(48-77) 105/77 mmHg (08/04 0700) SpO2:  [91 %-100 %] 100 % (08/04 0715) Arterial Line BP: (77-139)/(42-84) 114/60 mmHg (08/04 0715) FiO2 (%):  [40 %] 40 % (08/03 1105) Weight:  [203 lb 14.8 oz (92.5 kg)] 203 lb 14.8 oz (92.5 kg) (08/04 0600)  Physical Examination: General:  Awake and alert Neuro:  HOH but intact HEENT:  No andenopathy Neck:  Supple, no jvd Cardiovascular:  RRR, Nl S1/S2, 2/6 murmur Lungs:  Coarse BS diffusely. Chest tubes x 4, no overt bleeding or air leak Abdomen:  Soft, NT, ND and +BS. Musculoskeletal:  +edema and -tenderness. Skin:  Intact, wound appears clean.  Principal Problem:  *Severe aortic stenosis Active Problems:  CAD (coronary artery disease), native coronary artery  Hypertension  Hyperlipidemia  Chronic systolic CHF (congestive heart failure)  Moderate mitral regurgitation  Moderate aortic insufficiency  Cardiogenic shock  NSTEMI (non-ST elevated myocardial infarction)  Acute respiratory failure   ASSESSMENT AND PLAN  PULMONARY  Lab 08/27/11 1550 08/27/11 0413 08/26/11 0934 08/26/11 0347 08/25/11 1526  PHART 7.442 7.429 7.420 7.379 7.387  PCO2ART 32.2* 30.2* 32.3* 36.9 36.2  PO2ART 139.0* 135.0* 118.0* 195.0* 100.0  HCO3 21.9 19.7* 20.8 21.5 21.7  O2SAT 99.0 99.0 99.0 100.0 98.0   Ventilator Settings: Vent Mode:  [-] PSV FiO2 (%):  [40 %] 40 % Set Rate:  [18  bmp] 18 bmp Vt Set:  [550 mL] 550 mL PEEP:  [5 cmH20] 5 cmH20 Pressure Support:  [5 cmH20] 5 cmH20 Plateau Pressure:  [19 cmH20] 19 cmH20 CXR:  8/3 nsc  ETT:  8/1>>>8/3  A:  VDRF post op with adequate gas exchange. Resolved  P:   -extubated 8/3 -pul toilet CARDIOVASCULAR  Lab 08/24/11 0415 08/23/11 2122 08/23/11 1620  TROPONINI 9.61* 1.51* 1.84*  LATICACIDVEN -- -- --  PROBNP -- -- --   Lines: R IJ TLC 7/30>>>  L IJ Swan 8/1>>>  A: Hypotension, cardiogenic shock. P:  Pressors as needed   RENAL  Lab 08/28/11 0356 08/27/11 0409 08/26/11 1721 08/26/11 1700 08/26/11 0345 08/25/11 2018 08/25/11 2015 08/24/11 2048 08/24/11 1600  NA 133* 135 137 -- 137 138 -- -- --  K 4.0 4.3 -- -- -- -- -- -- --  CL 103 107 107 -- 109 109 -- -- --  CO2 19 20 -- -- 20 -- -- 26 26  BUN 30* 23 24* -- 27* 25* -- -- --  CREATININE 0.65 0.58 0.70 0.64 0.71 -- -- -- --  CALCIUM 8.3* 7.8* -- -- 8.0* -- -- 8.8 8.8  MG 2.6* 2.7* -- 2.9* 3.1* -- 3.4* -- --  PHOS 2.8 1.8* -- -- -- -- -- -- --   Intake/Output      08/03 0701 - 08/04 0700 08/04 0701 - 08/05 0700   I.V. (mL/kg) 2904.7 (31.4)    NG/GT  60    IV Piggyback     Total Intake(mL/kg) 2964.7 (32.1)    Urine (mL/kg/hr) 790 (0.4)    Emesis/NG output     Chest Tube 380    Total Output 1170    Net +1794.7          Foley:  In place  A:  No active issues. P:   Monitor I/O. Replace electrolytes as needed.  GASTROINTESTINAL  Lab 08/24/11 2048  AST 165*  ALT 39  ALKPHOS 57  BILITOT 0.4  PROT 6.5  ALBUMIN 3.0*    A:  No active issues. P:   On diet   HEMATOLOGIC  Lab 08/28/11 0356 08/27/11 2000 08/27/11 0409 08/26/11 1721 08/26/11 1700 08/26/11 0345 08/25/11 1531 08/24/11 2048  HGB 9.6* 9.3* 9.8* 10.2* 9.7* -- -- --  HCT 28.4* 27.5* 29.4* 30.0* 29.4* -- -- --  PLT 144* 128* 135* -- 132* 141* -- --  INR -- -- -- -- -- -- 1.89* --  APTT -- -- -- -- -- -- 43* 75*   A:  No active issues. P:  Daily CBC. Monitor  WBC.  INFECTIOUS  Lab 08/28/11 0356 08/27/11 2000 08/27/11 0409 08/26/11 1700 08/26/11 0345  WBC 14.8* 16.0* 12.7* 13.1* 12.9*  PROCALCITON -- -- -- -- --   Cultures: None Antibiotics: none  A:  No active infection. P:   Monitor fever curve.  ENDOCRINE  Lab 08/28/11 0717 08/28/11 0347 08/28/11 0009 08/27/11 2026 08/27/11 1545  GLUCAP 111* 123* 122* 125* 104*   A:  No diabetes history.   P:   Monitor BS.  NEUROLOGIC  A:  Intact P:    fentanyl PRN.    8/4 PCCM available PRN  Brett Canales Minor ACNP [facility] PCCM Pager [phone] till 3 pm If no answer page [phone] 08/28/2011, 8:13 AM  Tolerating extubation well, minimal O2 need, will make ambien available on a PRN bases for sleep.  Hemodynamically stable.  PCCM will sign off, please call back if needed.  Patient seen and examined, agree with above note.  I dictated the care and orders written for this patient under my direction.  Koren Bound, M.D. [phone]

## 2011-08-29 ENCOUNTER — Inpatient Hospital Stay (HOSPITAL_COMMUNITY): Payer: Medicare Other

## 2011-08-29 LAB — CBC
MCHC: 34.1 g/dL (ref 30.0–36.0)
Platelets: 152 10*3/uL (ref 150–400)
RDW: 14 % (ref 11.5–15.5)
WBC: 11.2 10*3/uL — ABNORMAL HIGH (ref 4.0–10.5)

## 2011-08-29 LAB — GLUCOSE, CAPILLARY
Glucose-Capillary: 90 mg/dL (ref 70–99)
Glucose-Capillary: 90 mg/dL (ref 70–99)
Glucose-Capillary: 104 mg/dL — ABNORMAL HIGH (ref 70–99)

## 2011-08-29 LAB — COMPREHENSIVE METABOLIC PANEL
ALT: 141 U/L — ABNORMAL HIGH (ref 0–53)
AST: 71 U/L — ABNORMAL HIGH (ref 0–37)
Albumin: 2.4 g/dL — ABNORMAL LOW (ref 3.5–5.2)
Alkaline Phosphatase: 57 U/L (ref 39–117)
BUN: 29 mg/dL — ABNORMAL HIGH (ref 6–23)
CO2: 23 mEq/L (ref 19–32)
Calcium: 8 mg/dL — ABNORMAL LOW (ref 8.4–10.5)
Chloride: 104 mEq/L (ref 96–112)
Creatinine, Ser: 0.61 mg/dL (ref 0.50–1.35)
GFR calc Af Amer: >90 mL/min (ref >90)
GFR calc non Af Amer: >90 mL/min (ref >90)
Glucose, Bld: 103 mg/dL — ABNORMAL HIGH (ref 70–99)
Potassium: 3.8 mEq/L (ref 3.5–5.1)
Sodium: 136 mEq/L (ref 135–145)
Total Bilirubin: 0.4 mg/dL (ref 0.3–1.2)
Total Protein: 5.2 g/dL — ABNORMAL LOW (ref 6.0–8.3)

## 2011-08-29 MED ORDER — DOPAMINE-DEXTROSE 3.2-5 MG/ML-% IV SOLN
2.5000 ug/kg/min | INTRAVENOUS | Status: DC
  Filled 2011-08-29 (×2): qty 250

## 2011-08-29 MED ORDER — ENOXAPARIN SODIUM 30 MG/0.3ML ~~LOC~~ SOLN
30.0000 mg | SUBCUTANEOUS | Status: DC
  Administered 2011-08-29 – 2011-09-08 (×11): 30 mg via SUBCUTANEOUS
  Filled 2011-08-29 (×12): qty 0.3

## 2011-08-29 MED ORDER — FUROSEMIDE 10 MG/ML IJ SOLN
40.0000 mg | Freq: Once | INTRAMUSCULAR | Status: AC
  Administered 2011-08-29: 40 mg via INTRAVENOUS

## 2011-08-29 MED ORDER — ENSURE COMPLETE PO LIQD
237.0000 mL | Freq: Two times a day (BID) | ORAL | Status: DC
  Administered 2011-08-29 – 2011-09-06 (×14): 237 mL via ORAL

## 2011-08-29 NOTE — Evaluation (Signed)
Physical Therapy Evaluation Patient Details Name: Gabriel Marsh MRN: [id number] DOB: [birth year] Today's Date: 08/29/2011 Time: 2536-6440 PT Time Calculation (min): 17 min  PT Assessment / Plan / Recommendation Clinical Impression  Pt adm for AVR and CABG.  Needs skilled PT to maximize I and safety so pt can go home with niece at [place].  Niece's house has 10 steps to enter which may present a challenge.  Hopefully pt will be able to go to niece's home with HHPT.    PT Assessment  Patient needs continued PT services    Follow Up Recommendations  Home health PT    Barriers to Discharge Inaccessible home environment 10 stairs to enter niece's home    Equipment Recommendations  None recommended by PT    Recommendations for Other Services OT consult   Frequency Min 3X/week    Precautions / Restrictions Precautions Precautions: Sternal;Fall   Pertinent Vitals/Pain Unable to obtain SaO2 reading       Mobility  Bed Mobility Bed Mobility: Left Sidelying to Sit;Rolling Left;Sitting - Scoot to Edge of Bed Rolling Left: 4: Min assist Left Sidelying to Sit: 3: Mod assist Sitting - Scoot to Edge of Bed: 3: Mod assist Details for Bed Mobility Assistance: Verbal cues for technique to follow sternal precautions. Assist to bring trunk up. Transfers Transfers: Sit to Stand;Stand to Sit Sit to Stand: Without upper extremity assist;3: Mod assist;From bed Stand to Sit: 4: Min assist;Without upper extremity assist;To chair/3-in-1 Details for Transfer Assistance: Verbal cues for techniques to follow sternal precautions. Ambulation/Gait Ambulation/Gait Assistance: 4: Min assist Ambulation Distance (Feet): 70 Feet Assistive device:  (pushing w/c) Ambulation/Gait Assistance Details: Pt initially with stagger posteriorly. Verbal cues to stand more erect. Gait Pattern: Step-through pattern;Decreased stride length;Trunk flexed    Exercises     PT Diagnosis: Difficulty walking;Generalized weakness    PT Problem List: Decreased strength;Decreased activity tolerance;Decreased balance;Decreased mobility;Decreased knowledge of use of DME;Decreased knowledge of precautions PT Treatment Interventions: DME instruction;Gait training;Stair training;Functional mobility training;Therapeutic activities;Therapeutic exercise;Balance training;Patient/family education   PT Goals Acute Rehab PT Goals PT Goal Formulation: With patient Time For Goal Achievement: 09/05/11 Potential to Achieve Goals: Good Pt will go Supine/Side to Sit: with supervision PT Goal: Supine/Side to Sit - Progress: Goal set today Pt will go Sit to Supine/Side: with supervision PT Goal: Sit to Supine/Side - Progress: Goal set today Pt will go Sit to Stand: with supervision PT Goal: Sit to Stand - Progress: Goal set today Pt will go Stand to Sit: with supervision PT Goal: Stand to Sit - Progress: Goal set today Pt will Ambulate: >150 feet;with supervision PT Goal: Ambulate - Progress: Goal set today Pt will Go Up / Down Stairs: 6-9 stairs;with min assist;with rail(s) PT Goal: Up/Down Stairs - Progress: Goal set today  Visit Information  Last PT Received On: 08/29/11    Subjective Data  Subjective: Pt states he went to the [facility] to exercise prior to hospitalization. Patient Stated Goal: Pt wants to walk and do for himself again.   Prior Functioning  Home Living Lives With: Alone (Will go to niece's house at [place]) Available Help at Discharge: Family;Available 24 hours/day (Niece) Type of Home: House Home Access: Stairs to enter [employer] of Steps: 10 Entrance Stairs-Rails: Right Home Layout: One level (except 2 steps up to bedroom.) Home Adaptive Equipment: Walker - rolling (Niece has one available) Prior Function Level of Independence: Independent Able to Take Stairs?: Yes Driving: Yes Vocation: Retired Musician: [employer]  Cognition  Overall Cognitive Status: Appears within  functional limits for tasks assessed/performed Arousal/Alertness: Awake/alert Orientation Level: Appears intact for tasks assessed Behavior During Session: [facility] for tasks performed    Extremity/Trunk Assessment Right Lower Extremity Assessment RLE ROM/Strength/Tone: Deficits RLE ROM/Strength/Tone Deficits: grossly 4/5 Left Lower Extremity Assessment LLE ROM/Strength/Tone: Deficits LLE ROM/Strength/Tone Deficits: grossly 4/5   Balance Static Standing Balance Static Standing - Balance Support: Bilateral upper extremity supported (on w/c) Static Standing - Level of Assistance: 4: Min assist  End of Session PT - End of Session Activity Tolerance: Patient limited by fatigue Patient left: in chair;with call bell/phone within reach;with nursing in room Nurse Communication: Mobility status  GP     Shayleen Eppinger 08/29/2011, 1:50 PM  [facility] PT [phone]

## 2011-08-29 NOTE — Progress Notes (Signed)
Patient ID: Gabriel Marsh, male   DOB: 02-Dec-1930, 78 y.o.   MRN: [id number]  Filed Vitals:   08/29/11 1700 08/29/11 1800 08/29/11 1900 08/29/11 1932  BP: 95/51 94/79 96/65    Pulse: 70  79   Temp:    97.5 F (36.4 C)  TempSrc:    Axillary  Resp: 20 15 23    Height:      Weight:      SpO2: 100%  100%     A-fib earlier, now Sinus rhythm 78  Urine output ok  Walked today.  Sleeping now.

## 2011-08-29 NOTE — Progress Notes (Signed)
Nutrition Follow-up  Intervention:    Ensure Complete twice daily (350 kcals, 13 gm protein per 8 fl oz bottle RD to follow for nutrition care plan  Assessment:   Patient extubated 8/3. Lasix for diuresis. Per RN, patient consuming 25-50% at meals. Overall doing well post-op per MD note. RD to order supplement to help meet kcals, protein needs.  Diet Order:  Heart Healthy  Meds: Scheduled Meds:   . acetaminophen  1,000 mg Oral Q6H   Or  . acetaminophen (TYLENOL) oral liquid 160 mg/5 mL  975 mg Per Tube Q6H  . amiodarone  400 mg Oral BID  . aspirin EC  325 mg Oral Daily   Or  . aspirin  324 mg Per Tube Daily  . bisacodyl  10 mg Oral Daily   Or  . bisacodyl  10 mg Rectal Daily  . docusate sodium  200 mg Oral Daily  . enoxaparin  30 mg Subcutaneous Q24H  . furosemide  40 mg Intravenous Once  . insulin aspart  0-24 Units Subcutaneous Q4H  . insulin regular  0-10 Units Intravenous TID WC  . metoprolol tartrate  12.5 mg Oral BID   Or  . metoprolol tartrate  12.5 mg Per Tube BID  . pantoprazole  40 mg Oral Q1200  . simvastatin  5 mg Oral q1800  . sodium chloride  3 mL Intravenous Q12H  . DISCONTD: antiseptic oral rinse  15 mL Mouth Rinse QID  . DISCONTD: furosemide  20 mg Intravenous BID  . DISCONTD: furosemide  20 mg Intravenous BID   Continuous Infusions:   . sodium chloride Stopped (08/28/11 1500)  . sodium chloride Stopped (08/25/11 1530)  . sodium chloride    . DOPamine 2.5 mcg/kg/min (08/29/11 1000)  . lactated ringers 20 mL (08/29/11 0800)  . phenylephrine (NEO-SYNEPHRINE) Adult infusion Stopped (08/28/11 1700)  . DISCONTD: DOPamine 5 mcg/kg/min (08/28/11 1300)   PRN Meds:.metoprolol, morphine injection, ondansetron (ZOFRAN) IV, oxyCODONE, sodium chloride, traMADol, zolpidem  Labs:  CMP     Component Value Date/Time   NA 136 08/29/2011 0428   K 3.8 08/29/2011 0428   CL 104 08/29/2011 0428   CO2 23 08/29/2011 0428   GLUCOSE 103* 08/29/2011 0428   BUN 29* 08/29/2011 0428     CREATININE 0.61 08/29/2011 0428   CALCIUM 8.0* 08/29/2011 0428   PROT 5.2* 08/29/2011 0428   ALBUMIN 2.4* 08/29/2011 0428   AST 71* 08/29/2011 0428   ALT 141* 08/29/2011 0428   ALKPHOS 57 08/29/2011 0428   BILITOT 0.4 08/29/2011 0428   GFRNONAA >90 08/29/2011 0428   GFRAA >90 08/29/2011 0428     Intake/Output Summary (Last 24 hours) at 08/29/11 1449 Last data filed at 08/29/11 1400  Gross per 24 hour  Intake 1190.59 ml  Output   1900 ml  Net -709.41 ml    CBG (last 3)   Basename 08/29/11 1135 08/29/11 0758 08/29/11 0332  GLUCAP 90 97 92    Weight Status:  91.8 kg (8/5) -- fluctuating   Re-estimated needs:  2000-2200 kcals, 100-110 gm protein  Nutrition Dx:  Inadequate Oral Intake now r/t limited appetite as evidenced by PO intake 25-50%, ongoing  New Goal:  Oral intake with meals & supplements to meet >/= 90% of estimated nutrition needs, currently unmet  Monitor:  PO & supplemental intake, weight, labs, I/O's  Kirkland Hun, RD, LDN Pager #: [phone] After-Hours Pager #: [phone]

## 2011-08-29 NOTE — Progress Notes (Signed)
Patient ID: Gabriel Marsh, male   DOB: 07/24/1930, 78 y.o.   MRN: [id number] TCTS DAILY PROGRESS NOTE                   [address]            [place] [address]          [phone]      4 Days Post-Op Procedure(s) (LRB): CORONARY ARTERY BYPASS GRAFTING (CABG) (N/A) AORTIC VALVE REPLACEMENT (AVR) (N/A)  Total Length of Stay:  LOS: 6 days   Subjective: Sitting in chair, neuro intact  Objective: Vital signs in last 24 hours: Temp:  [96.3 F (35.7 C)-97.9 F (36.6 C)] 97.6 F (36.4 C) (08/05 1137) Pulse Rate:  [57-88] 66  (08/05 1300) Cardiac Rhythm:  [-] Atrial fibrillation;Heart block (08/05 1200) Resp:  [16-27] 22  (08/05 1300) BP: (84-115)/(25-71) 99/54 mmHg (08/05 1300) SpO2:  [93 %-100 %] 100 % (08/05 1300) Arterial Line BP: (109-119)/(49-55) 119/49 mmHg (08/04 1600) Weight:  [202 lb 6.1 oz (91.8 kg)] 202 lb 6.1 oz (91.8 kg) (08/05 0630)  Filed Weights   08/27/11 0545 08/28/11 0600 08/29/11 0630  Weight: 212 lb 1.3 oz (96.2 kg) 203 lb 14.8 oz (92.5 kg) 202 lb 6.1 oz (91.8 kg)    Weight change: -1 lb 8.7 oz (-0.7 kg)   Hemodynamic parameters for last 24 hours: PAP: (39)/(23) 39/23 mmHg  Intake/Output from previous day: 08/04 0701 - 08/05 0700 In: 1329.5 [P.O.:240; I.V.:1079.5; IV Piggyback:10] Out: 1700 [Urine:1350; Chest Tube:350]  Intake/Output this shift: Total I/O In: 355.1 [P.O.:150; I.V.:205.1] Out: 850 [Urine:850]  Current Meds: Scheduled Meds:   . acetaminophen  1,000 mg Oral Q6H   Or  . acetaminophen (TYLENOL) oral liquid 160 mg/5 mL  975 mg Per Tube Q6H  . amiodarone  400 mg Oral BID  . aspirin EC  325 mg Oral Daily   Or  . aspirin  324 mg Per Tube Daily  . bisacodyl  10 mg Oral Daily   Or  . bisacodyl  10 mg Rectal Daily  . docusate sodium  200 mg Oral Daily  . enoxaparin  30 mg Subcutaneous Q24H  . furosemide  40 mg Intravenous Once  . insulin aspart  0-24 Units Subcutaneous Q4H  . insulin regular  0-10 Units  Intravenous TID WC  . metoprolol tartrate  12.5 mg Oral BID   Or  . metoprolol tartrate  12.5 mg Per Tube BID  . pantoprazole  40 mg Oral Q1200  . simvastatin  5 mg Oral q1800  . sodium chloride  3 mL Intravenous Q12H  . DISCONTD: antiseptic oral rinse  15 mL Mouth Rinse QID  . DISCONTD: furosemide  20 mg Intravenous BID  . DISCONTD: furosemide  20 mg Intravenous BID   Continuous Infusions:   . sodium chloride Stopped (08/28/11 1500)  . sodium chloride Stopped (08/25/11 1530)  . sodium chloride    . DOPamine 2.5 mcg/kg/min (08/29/11 1000)  . lactated ringers 20 mL (08/29/11 0800)  . phenylephrine (NEO-SYNEPHRINE) Adult infusion Stopped (08/28/11 1700)  . DISCONTD: DOPamine 5 mcg/kg/min (08/28/11 1300)   PRN Meds:.metoprolol, morphine injection, ondansetron (ZOFRAN) IV, oxyCODONE, sodium chloride, traMADol, zolpidem  General appearance: alert and cooperative Neurologic: intact Heart: irregularly irregular rhythm Lungs: clear to auscultation bilaterally and normal percussion bilaterally Abdomen: soft, non-tender; bowel sounds normal; no masses,  no organomegaly Extremities: extremities normal, atraumatic, no cyanosis or edema Wound: sternum stable  Lab Results: CBC: Basename 08/29/11 0428  08/28/11 1732 08/28/11 0356  WBC 11.2* -- 14.8*  HGB 9.2* 9.2* --  HCT 27.0* 27.0* --  PLT 152 -- 144*   BMET:  Basename 08/29/11 0428 08/28/11 1732 08/28/11 0356  NA 136 135 --  K 3.8 3.9 --  CL 104 -- 103  CO2 23 -- 19  GLUCOSE 103* 105* --  BUN 29* -- 30*  CREATININE 0.61 -- 0.65  CALCIUM 8.0* -- 8.3*    PT/INR: No results found for this basename: LABPROT,INR in the last 72 hours Radiology: Dg Chest Port 1 View  08/29/2011  *RADIOLOGY REPORT*  Clinical Data: Status post CABG  PORTABLE CHEST - 1 VIEW  Comparison: Chest radiograph 08/28/2011  Findings: Interval removal of Swan-Ganz catheter with left sheath remaining.  Right IJ line is unchanged.  Left chest tube in place without  pneumothorax.  Stable enlarged heart silhouette.  There is mild bibasilar air space disease and bilateral pleural effusions. Air space is not significantly changed.  IMPRESSION:  1.  Cardiomegaly and bibasilar air space disease suggest mild pulmonary edema. 2.  Left chest tube in place without pneumothorax.  Original Report Authenticated By: Genevive Bi, M.D.   Dg Chest Port 1 View  08/28/2011  *RADIOLOGY REPORT*  Clinical Data: Intubated  PORTABLE CHEST - 1 VIEW  Comparison:   the previous day's study  Findings: The patient has been extubated, and the nasogastric tube removed.  Left IJ Swan-Ganz, right IJ central line, and left chest tube stable position.  No pneumothorax.  Stable mild cardiomegaly. IABP tip no longer seen.  Perihilar and bibasilar interstitial edema or infiltrates appear slightly increased, probably emphasized by the slightly lower lung volumes.  Left retrocardiac consolidation / atelectasis.  No definite effusion.  IMPRESSION:  1.  Extubation with question worsening of bilateral edema or interstitial infiltrates.  Original Report Authenticated By: Osa Craver, M.D.     Assessment/Plan: S/P Procedure(s) (LRB): CORONARY ARTERY BYPASS GRAFTING (CABG) (N/A) AORTIC VALVE REPLACEMENT (AVR) (N/A) Mobilize Diuresis Diabetes control wean doapmine PT consult     Delight Ovens MD  Beeper [phone] Office [phone] 08/29/2011 1:31 PM

## 2011-08-29 NOTE — Progress Notes (Signed)
Subjective:  The patient is 4 days postop coronary artery bypass grafting and aortic valve replacement.  He required an preoperative intra-aortic balloon pump which is now removed.    The patient presently is sitting up in a chair eating breakfast and looks well.  He is hard of hearing but he indicates no significant problems with chest discomfort or dyspnea at this time.  Rhythm is atrial fibrillation with a controlled ventricular response.  Objective:  Vital Signs in the last 24 hours: Temp:  [96.3 F (35.7 C)-98.1 F (36.7 C)] 96.3 F (35.7 C) (08/05 0800) Pulse Rate:  [57-88] 57  (08/05 0900) Resp:  [16-27] 17  (08/05 0900) BP: (86-128)/(25-71) 107/32 mmHg (08/05 0900) SpO2:  [97 %-100 %] 100 % (08/05 0900) Arterial Line BP: (109-151)/(49-86) 119/49 mmHg (08/04 1600) Weight:  [202 lb 6.1 oz (91.8 kg)] 202 lb 6.1 oz (91.8 kg) (08/05 0630)  Intake/Output from previous day: 08/04 0701 - 08/05 0700 In: 1329.5 [P.O.:240; I.V.:1079.5; IV Piggyback:10] Out: 1700 [Urine:1350; Chest Tube:350] Intake/Output from this shift: Total I/O In: 27.6 [I.V.:27.6] Out: 150 [Urine:150]     . acetaminophen  1,000 mg Oral Q6H   Or  . acetaminophen (TYLENOL) oral liquid 160 mg/5 mL  975 mg Per Tube Q6H  . amiodarone  400 mg Oral BID  . antiseptic oral rinse  15 mL Mouth Rinse QID  . aspirin EC  325 mg Oral Daily   Or  . aspirin  324 mg Per Tube Daily  . bisacodyl  10 mg Oral Daily   Or  . bisacodyl  10 mg Rectal Daily  . docusate sodium  200 mg Oral Daily  . enoxaparin  30 mg Subcutaneous Q24H  . furosemide  40 mg Intravenous Once  . insulin aspart  0-24 Units Subcutaneous Q4H  . insulin regular  0-10 Units Intravenous TID WC  . metoprolol tartrate  12.5 mg Oral BID   Or  . metoprolol tartrate  12.5 mg Per Tube BID  . pantoprazole  40 mg Oral Q1200  . simvastatin  5 mg Oral q1800  . sodium chloride  3 mL Intravenous Q12H  . DISCONTD: furosemide  20 mg Intravenous BID  . DISCONTD:  furosemide  20 mg Intravenous BID      . sodium chloride Stopped (08/28/11 1500)  . sodium chloride Stopped (08/25/11 1530)  . sodium chloride    . DOPamine 3 mcg/kg/min (08/29/11 0949)  . lactated ringers 20 mL (08/29/11 0800)  . phenylephrine (NEO-SYNEPHRINE) Adult infusion Stopped (08/28/11 1700)  . DISCONTD: DOPamine 5 mcg/kg/min (08/28/11 1300)    Physical Exam: The patient appears to be in no distress. The chest reveals diminished breath sounds at the bases The heart reveals a soft systolic ejection murmur across the prosthetic aortic valve.  No diastolic murmur.  No pericardial rub.  No gallop.   Lab Results:  Basename 08/29/11 0428 08/28/11 1732 08/28/11 0356  WBC 11.2* -- 14.8*  HGB 9.2* 9.2* --  PLT 152 -- 144*    Basename 08/29/11 0428 08/28/11 1732 08/28/11 0356  NA 136 135 --  K 3.8 3.9 --  CL 104 -- 103  CO2 23 -- 19  GLUCOSE 103* 105* --  BUN 29* -- 30*  CREATININE 0.61 -- 0.65   No results found for this basename: TROPONINI:2,CK,MB:2 in the last 72 hours Hepatic Function Panel  Basename 08/29/11 0428  PROT 5.2*  ALBUMIN 2.4*  AST 71*  ALT 141*  ALKPHOS 57  BILITOT 0.4  BILIDIR --  IBILI --   No results found for this basename: CHOL in the last 72 hours No results found for this basename: PROTIME in the last 72 hours  Imaging: Dg Chest Port 1 View  08/29/2011  *RADIOLOGY REPORT*  Clinical Data: Status post CABG  PORTABLE CHEST - 1 VIEW  Comparison: Chest radiograph 08/28/2011  Findings: Interval removal of Swan-Ganz catheter with left sheath remaining.  Right IJ line is unchanged.  Left chest tube in place without pneumothorax.  Stable enlarged heart silhouette.  There is mild bibasilar air space disease and bilateral pleural effusions. Air space is not significantly changed.  IMPRESSION:  1.  Cardiomegaly and bibasilar air space disease suggest mild pulmonary edema. 2.  Left chest tube in place without pneumothorax.  Original Report Authenticated  By: Genevive Bi, M.D.   Dg Chest Port 1 View  08/28/2011  *RADIOLOGY REPORT*  Clinical Data: Intubated  PORTABLE CHEST - 1 VIEW  Comparison:   the previous day's study  Findings: The patient has been extubated, and the nasogastric tube removed.  Left IJ Swan-Ganz, right IJ central line, and left chest tube stable position.  No pneumothorax.  Stable mild cardiomegaly. IABP tip no longer seen.  Perihilar and bibasilar interstitial edema or infiltrates appear slightly increased, probably emphasized by the slightly lower lung volumes.  Left retrocardiac consolidation / atelectasis.  No definite effusion.  IMPRESSION:  1.  Extubation with question worsening of bilateral edema or interstitial infiltrates.  Original Report Authenticated By: Osa Craver, M.D.    Cardiac Studies:  Assessment/Plan:  Patient Active Hospital Problem List: 1.status post aortic valve replacement and coronary artery bypass graft surgery overall doing well  2.  Postoperative atrial fibrillation with rate control on amiodarone and metoprolol   LOS: 6 days    Cassell Clement 08/29/2011, 9:57 AM

## 2011-08-29 NOTE — Progress Notes (Signed)
LIJ sleeve pulled per order.  Pt lied flat during removal.  Pt instructed to take a deep breath and hold it during removal. Vaseline, occlusive dressing applied during and after removal. Some bleeding noted, pressure held for 5 minutes. During holding pressure patient became difficult to arouse verbally and tachypneic for approximately 30 seconds.  BP 86/68, HR 71, O2 sats 99 on 1L [place]. Pt woke up and stated "I don't know what happened".  BP now 100/48, HR 77.  Pt moves all extremities equally, alert and oriented x4.  Will continue to monitor.

## 2011-08-29 NOTE — Op Note (Signed)
NAME:  Gabriel Marsh, Gabriel Marsh NO.:  [id number]  MEDICAL RECORD NO.:  [id number]  LOCATION:  2315                         FACILITY:  [facility]  PHYSICIAN:  Sheliah Plane, MD    DATE OF BIRTH:  11/29/1930  DATE OF PROCEDURE:  08/25/2011 DATE OF DISCHARGE:                              OPERATIVE REPORT   PREOPERATIVE DIAGNOSES:  Recent non-ST elevation myocardial infarction with three-vessel coronary artery disease and critical aortic stenosis. Preoperative intra-aortic balloon pump in place.  POSTOPERATIVE DIAGNOSES:  Recent non-ST elevation myocardial infarction with three-vessel coronary artery disease and critical aortic stenosis. Preoperative intra-aortic balloon pump in place.  SURGICAL PROCEDURE:  Coronary artery bypass grafting x3 with left internal mammary to the left anterior descending coronary artery, reverse saphenous vein graft to intermediate coronary artery, and reverse saphenous vein graft to the distal right coronary artery with right leg endo vein harvesting, and aortic valve replacement or the pericardial tissue valve [employer] model #3300 TFX 23 mm, serial #[id number].  SURGEON:  Sheliah Plane, MD  FIRST ASSISTANT:  Rowe Clack, PA-C  BRIEF HISTORY:  The patient is a 78 year old male with a long history of murmur who presented to [facility] with acute onset of systolic dysfunction with heart failure symptoms.  Cardiac enzymes were positive. He had been having a history of intermittent right shoulder pain, not consistent with angina.  He was transferred to [facility]. Initially, was stable, but began having recurrent discomfort in his right shoulder and a near syncopal episode precipitating the placement of an intra-aortic balloon pump.  He stabilized.  Cardiac first consultation was obtained by echo, both [place]rolling in county and current the patient had critical aortic stenosis with severe LV dysfunction, ejection fraction 25%.   Cardiac cath films were reviewed and revealed severe three-vessel disease with 70-80% LAD stenosis, 99% stenosis of the large ramus branch that was predominant supplier of the lateral wall, and critical stenosis in the right coronary system with total occlusion.  In addition, he had total occlusion of the small circumflex system.  Urgent coronary artery bypass grafting and aortic valve replacement was recommended to the patient and his family, he agreed and signed informed consent.  DESCRIPTION OF THE PROCEDURE:  With Swan-Ganz and arterial line monitors in place, the patient was taken directly to the operating room, with intra-aortic balloon pump previously placed.  He underwent general endotracheal anesthesia without incident.  The transesophageal echo probe was placed and confirmed a trileaflet aortic valve with severe aortic stenosis, trace mitral regurgitation, and severe LV dysfunction. After the patient has a balloon pump had been placed preoperatively, he was maintained on 10 mics per kilo per minute of dopamine to support his blood pressure and came to the operating room with this running.  After induction of general anesthesia, the skin of the chest and legs was prepped with Betadine and draped in the usual sterile manner. Appropriate time-out was performed using the [employer] endo vein harvesting system, vein was harvested from the right thigh and calf and was of excellent quality and caliber.  Median sternotomy was performed. Left internal mammary artery was dissected down as a pedicle graft.  The distal artery was  divided, had good free flow.  Pericardium was opened and as noted, the patient had evidence of left ventricular hypertrophy and decreased LV function.  He was systemically heparinized.  Ascending aorta was cannulated.  The right atrium was cannulated.  The patient was placed on cardiopulmonary bypass 2.4 L/minute per m squared.  The right superior pulmonary vein vent  was placed.  Retrograde cardioplegic catheter was placed.  The patient's body temp was cooled to 32 degrees. Aortic crossclamp was applied, and 700 mL of cold blood potassium cardioplegia was administered.  Antegrade additional cold blood cardioplegia was administered retrograde.  Attention was turned 1st to the coronary.  The heart was elevated and the large ramus branch was dissected out of its intramyocardial position.  This was a good size vessel, admitting a 1.5 mm probe easily.  Using a running 7-0 Prolene, a segment of reverse saphenous vein graft was anastomosed to the intermediate coronary artery.  The very distal circumflex branch which was totally occluded was evaluated, but was extremely small branch less than 1 mm not felt suitable for bypass.  Attention was then turned to the distal right coronary artery, which was opened somewhat thickened vessel, but admitted to 1.5 mm probe distally.  Using a running 7-0 Prolene, distal anastomosis was performed.  Intermittently through the procedure, retrograde cardioplegia was administered and additional cardioplegia was administered down the vein graft.  Attention was then turned to the LAD which was opened in the mid to distal portion.  There was diffuse disease throughout the LAD.  Using a running 8-0 Prolene, left internal mammary artery was anastomosed to left anterior descending coronary artery.  With the grafts completed, attention was then turned to the aortic valve.  A transverse aortotomy was performed.  This gave good exposure of the trileaflet aortic highly calcified valve.  The valve was excised.  Anulus was debrided.  A #2 Tycron pledgeted sutures were placed circumferentially in the anulus.  A [employer] pericardial tissue valve 23 mm in size was selected and secured in place.  The valve seated well without impingement on the coronary ostium.  Care was taken to remove all loose calcific debris.  The aortotomy  was then closed with horizontal mattress 4-0 Prolene suture over felt strips.  An additional over-and-over simple stitch closure. Two punch aortotomies were created in the ascending aorta and each of the 2 vein grafts were anastomosed to the ascending aorta.  Before complete closure of the aortotomies, the heart was allowed to fill and de-air CO2 and the pericardial cavity had been used throughout the procedure.  The aortic crossclamp was removed with a total crossclamp time of 140 minutes.  An 18-gauge needle was introduced into the left ventricular apex to further de-air the heart.  The patient spontaneously converted to a sinus rhythm.  Atrial and ventricular pacing wires were applied.  The sites anastomosed were inspected and were free of bleeding.  The patient's body temperature was rewarmed to 37 degrees. His dopamine infusion was continued at 5 mics per kilos per minute of milrinone infusion and loading dose was also started.  He was then ventilated and weaned from cardiopulmonary bypass.  He remained hemodynamically stable with the intra-aortic balloon pump assisting that one to two.  He was decannulated in the usual fashion.  Protamine sulfate was administered with the operative field hemostatic.  A left pleural tube and 2 Blake mediastinal drains were left in place. Pericardium was loosely reapproximated.  Sternum was closed with #6 stainless  steel wire.  Fascia closed with interrupted 0 Vicryl, running 3-0 Vicryl in the subcutaneous tissue, and 4-0 subcuticular stitch in the skin edges.  Dry dressings were applied.  Sponge and needle count was reported as correct at completion of the procedure.  Total pump time was 105 minutes.  The patient was then transferred to Surgical Intensive Care Unit for further postoperative care.     Sheliah Plane, MD     EG/MEDQ  D:  08/29/2011  T:  08/29/2011  Job:  [id number]  cc:   Cassell Clement, M.D.

## 2011-08-30 LAB — GLUCOSE, CAPILLARY
Glucose-Capillary: 107 mg/dL — ABNORMAL HIGH (ref 70–99)
Glucose-Capillary: 109 mg/dL — ABNORMAL HIGH (ref 70–99)
Glucose-Capillary: 88 mg/dL (ref 70–99)
Glucose-Capillary: 88 mg/dL (ref 70–99)

## 2011-08-30 LAB — CBC
HCT: 25.1 % — ABNORMAL LOW (ref 39.0–52.0)
Hemoglobin: 8.6 g/dL — ABNORMAL LOW (ref 13.0–17.0)
MCH: 29.8 pg (ref 26.0–34.0)
MCHC: 34.3 g/dL (ref 30.0–36.0)
MCV: 86.9 fL (ref 78.0–100.0)
Platelets: 210 10*3/uL (ref 150–400)
RBC: 2.89 MIL/uL — ABNORMAL LOW (ref 4.22–5.81)
RDW: 13.9 % (ref 11.5–15.5)
WBC: 9.3 10*3/uL (ref 4.0–10.5)

## 2011-08-30 LAB — BASIC METABOLIC PANEL
BUN: 28 mg/dL — ABNORMAL HIGH (ref 6–23)
CO2: 25 mEq/L (ref 19–32)
Calcium: 7.7 mg/dL — ABNORMAL LOW (ref 8.4–10.5)
Chloride: 102 mEq/L (ref 96–112)
Creatinine, Ser: 0.65 mg/dL (ref 0.50–1.35)
GFR calc Af Amer: >90 mL/min (ref >90)
GFR calc non Af Amer: >90 mL/min (ref >90)
Glucose, Bld: 103 mg/dL — ABNORMAL HIGH (ref 70–99)
Potassium: 3.6 mEq/L (ref 3.5–5.1)
Sodium: 134 mEq/L — ABNORMAL LOW (ref 135–145)

## 2011-08-30 LAB — CULTURE, RESPIRATORY W GRAM STAIN

## 2011-08-30 MED ORDER — POTASSIUM CHLORIDE 10 MEQ/50ML IV SOLN
10.0000 meq | INTRAVENOUS | Status: AC | PRN
  Administered 2011-08-30 (×3): 10 meq via INTRAVENOUS
  Filled 2011-08-30: qty 100

## 2011-08-30 MED ORDER — POTASSIUM CHLORIDE 10 MEQ/50ML IV SOLN
INTRAVENOUS | Status: AC
  Administered 2011-08-30: 10 meq via INTRAVENOUS
  Filled 2011-08-30: qty 50

## 2011-08-30 NOTE — Progress Notes (Signed)
Patient ID: Gabriel Marsh, male   DOB: October 25, 1930, 78 y.o.   MRN: [id number] TCTS DAILY PROGRESS NOTE                   [address]            [place] [id number]          [phone]      5 Days Post-Op Procedure(s) (LRB): CORONARY ARTERY BYPASS GRAFTING (CABG) (N/A) AORTIC VALVE REPLACEMENT (AVR) (N/A)  Total Length of Stay:  LOS: 7 days   Subjective: Continues to improve, ambulating better with help  Objective: Vital signs in last 24 hours: Temp:  [96.5 F (35.8 C)-97.9 F (36.6 C)] 97.9 F (36.6 C) (08/06 0735) Pulse Rate:  [57-82] 78  (08/06 0700) Cardiac Rhythm:  [-] Heart block (08/06 0000) Resp:  [15-27] 19  (08/06 0700) BP: (84-111)/(32-79) 107/48 mmHg (08/06 0700) SpO2:  [93 %-100 %] 99 % (08/06 0700) Weight:  [201 lb 8 oz (91.4 kg)] 201 lb 8 oz (91.4 kg) (08/06 0500)  Filed Weights   08/28/11 0600 08/29/11 0630 08/30/11 0500  Weight: 203 lb 14.8 oz (92.5 kg) 202 lb 6.1 oz (91.8 kg) 201 lb 8 oz (91.4 kg)    Weight change: -14.1 oz (-0.4 kg)   Hemodynamic parameters for last 24 hours:    Intake/Output from previous day: 08/05 0701 - 08/06 0700 In: 1221.4 [P.O.:550; I.V.:621.4; IV Piggyback:50] Out: 1050 [Urine:1050]  Intake/Output this shift:    Current Meds: Scheduled Meds:   . acetaminophen  1,000 mg Oral Q6H   Or  . acetaminophen (TYLENOL) oral liquid 160 mg/5 mL  975 mg Per Tube Q6H  . amiodarone  400 mg Oral BID  . aspirin EC  325 mg Oral Daily   Or  . aspirin  324 mg Per Tube Daily  . bisacodyl  10 mg Oral Daily   Or  . bisacodyl  10 mg Rectal Daily  . docusate sodium  200 mg Oral Daily  . enoxaparin  30 mg Subcutaneous Q24H  . feeding supplement  237 mL Oral BID BM  . furosemide  40 mg Intravenous Once  . insulin aspart  0-24 Units Subcutaneous Q4H  . insulin regular  0-10 Units Intravenous TID WC  . metoprolol tartrate  12.5 mg Oral BID   Or  . metoprolol tartrate  12.5 mg Per Tube BID  . pantoprazole  40 mg Oral  Q1200  . simvastatin  5 mg Oral q1800  . sodium chloride  3 mL Intravenous Q12H  . DISCONTD: antiseptic oral rinse  15 mL Mouth Rinse QID   Continuous Infusions:   . sodium chloride Stopped (08/28/11 1500)  . sodium chloride Stopped (08/25/11 1530)  . sodium chloride    . DOPamine 2.5 mcg/kg/min (08/29/11 1000)  . lactated ringers 20 mL (08/29/11 0800)  . phenylephrine (NEO-SYNEPHRINE) Adult infusion Stopped (08/28/11 1700)   PRN Meds:.metoprolol, morphine injection, ondansetron (ZOFRAN) IV, oxyCODONE, potassium chloride, sodium chloride, traMADol, zolpidem  General appearance: alert, cooperative and no distress Neurologic: intact Heart: regular rate and rhythm, S1, S2 normal, no murmur, click, rub or gallop and normal apical impulse Lungs: clear to auscultation bilaterally and normal percussion bilaterally Abdomen: soft, non-tender; bowel sounds normal; no masses,  no organomegaly Extremities: extremities normal, atraumatic, no cyanosis or edema and Homans sign is negative, no sign of DVT Wound: sternum stable  Lab Results: CBC: Basename 08/30/11 0445 08/29/11 0428  WBC 9.3 11.2*  HGB 8.6*  9.2*  HCT 25.1* 27.0*  PLT 210 152   BMET:  Basename 08/30/11 0445 08/29/11 0428  NA 134* 136  K 3.6 3.8  CL 102 104  CO2 25 23  GLUCOSE 103* 103*  BUN 28* 29*  CREATININE 0.65 0.61  CALCIUM 7.7* 8.0*    PT/INR: No results found for this basename: LABPROT,INR in the last 72 hours Radiology: Dg Chest Port 1 View  08/29/2011  *RADIOLOGY REPORT*  Clinical Data: Status post CABG  PORTABLE CHEST - 1 VIEW  Comparison: Chest radiograph 08/28/2011  Findings: Interval removal of Swan-Ganz catheter with left sheath remaining.  Right IJ line is unchanged.  Left chest tube in place without pneumothorax.  Stable enlarged heart silhouette.  There is mild bibasilar air space disease and bilateral pleural effusions. Air space is not significantly changed.  IMPRESSION:  1.  Cardiomegaly and bibasilar  air space disease suggest mild pulmonary edema. 2.  Left chest tube in place without pneumothorax.  Original Report Authenticated By: Genevive Bi, M.D.     Assessment/Plan: S/P Procedure(s) (LRB): CORONARY ARTERY BYPASS GRAFTING (CABG) (N/A) AORTIC VALVE REPLACEMENT (AVR) (N/A) Mobilize Diuresis wean off dopamine     Delight Ovens MD  Beeper [phone] Office [phone] 08/30/2011 8:08 AM

## 2011-08-30 NOTE — Progress Notes (Signed)
Subjective:  Patient is doing very well . Has walked in hall. Back in NSR today.  Objective:  Vital Signs in the last 24 hours: Temp:  [96.5 F (35.8 C)-97.9 F (36.6 C)] 97.5 F (36.4 C) (08/06 1107) Pulse Rate:  [66-82] 78  (08/06 1100) Resp:  [15-26] 19  (08/06 1100) BP: (84-110)/(37-79) 99/52 mmHg (08/06 1100) SpO2:  [97 %-100 %] 99 % (08/06 1100) Weight:  [201 lb 8 oz (91.4 kg)] 201 lb 8 oz (91.4 kg) (08/06 0500)  Intake/Output from previous day: 08/05 0701 - 08/06 0700 In: 1221.4 [P.O.:550; I.V.:621.4; IV Piggyback:50] Out: 1050 [Urine:1050] Intake/Output from this shift: Total I/O In: 374.1 [P.O.:150; I.V.:124.1; IV Piggyback:100] Out: 300 [Urine:300]     . acetaminophen  1,000 mg Oral Q6H   Or  . acetaminophen (TYLENOL) oral liquid 160 mg/5 mL  975 mg Per Tube Q6H  . amiodarone  400 mg Oral BID  . aspirin EC  325 mg Oral Daily   Or  . aspirin  324 mg Per Tube Daily  . bisacodyl  10 mg Oral Daily   Or  . bisacodyl  10 mg Rectal Daily  . docusate sodium  200 mg Oral Daily  . enoxaparin  30 mg Subcutaneous Q24H  . feeding supplement  237 mL Oral BID BM  . insulin aspart  0-24 Units Subcutaneous Q4H  . insulin regular  0-10 Units Intravenous TID WC  . metoprolol tartrate  12.5 mg Oral BID   Or  . metoprolol tartrate  12.5 mg Per Tube BID  . pantoprazole  40 mg Oral Q1200  . simvastatin  5 mg Oral q1800  . sodium chloride  3 mL Intravenous Q12H  . DISCONTD: antiseptic oral rinse  15 mL Mouth Rinse QID      . sodium chloride Stopped (08/28/11 1500)  . sodium chloride Stopped (08/25/11 1530)  . sodium chloride    . lactated ringers 20 mL (08/30/11 1100)  . phenylephrine (NEO-SYNEPHRINE) Adult infusion Stopped (08/28/11 1700)  . DISCONTD: DOPamine 1.5 mcg/kg/min (08/30/11 1000)    Physical Exam: The patient appears to be in no distress.  Chest reveals mild rhonchi.  Heart reveals soft pleuropericardial rub.  No AI.  The abdomen is soft and  nontender.  Bowel sounds are normoactive.  There is no hepatosplenomegaly or mass.  There are no abdominal bruits.  Extremities reveal mild pretibial edema.    Neurologic exam is normal strength and no lateralizing weakness.  No sensory deficits.  Integument reveals no rash  Lab Results:  Basename 08/30/11 0445 08/29/11 0428  WBC 9.3 11.2*  HGB 8.6* 9.2*  PLT 210 152    Basename 08/30/11 0445 08/29/11 0428  NA 134* 136  K 3.6 3.8  CL 102 104  CO2 25 23  GLUCOSE 103* 103*  BUN 28* 29*  CREATININE 0.65 0.61   No results found for this basename: TROPONINI:2,CK,MB:2 in the last 72 hours Hepatic Function Panel  Basename 08/29/11 0428  PROT 5.2*  ALBUMIN 2.4*  AST 71*  ALT 141*  ALKPHOS 57  BILITOT 0.4  BILIDIR --  IBILI --   No results found for this basename: CHOL in the last 72 hours No results found for this basename: PROTIME in the last 72 hours  Imaging: Dg Chest Port 1 View  08/29/2011  *RADIOLOGY REPORT*  Clinical Data: Status post CABG  PORTABLE CHEST - 1 VIEW  Comparison: Chest radiograph 08/28/2011  Findings: Interval removal of Swan-Ganz catheter with left sheath  remaining.  Right IJ line is unchanged.  Left chest tube in place without pneumothorax.  Stable enlarged heart silhouette.  There is mild bibasilar air space disease and bilateral pleural effusions. Air space is not significantly changed.  IMPRESSION:  1.  Cardiomegaly and bibasilar air space disease suggest mild pulmonary edema. 2.  Left chest tube in place without pneumothorax.  Original Report Authenticated By: Genevive Bi, M.D.    Cardiac Studies:  Assessment/Plan:  S/P aortic valve replacement and CABG doing well. Post-op atrial fib now back in NSR on amiodarone and BB  LOS: 7 days    Cassell Clement 08/30/2011, 12:09 PM

## 2011-08-30 NOTE — Progress Notes (Signed)
Physical Therapy Treatment Patient Details Name: Gabriel Marsh MRN: [id number] DOB: 11/13/1930 Today's Date: 08/30/2011 Time: 5284-1324 PT Time Calculation (min): 24 min  PT Assessment / Plan / Recommendation Comments on Treatment Session  Generally improved gait/steadiness.  Sternal precautions were reinforced and pt followed them well, but with VC    Follow Up Recommendations  Home health PT    Barriers to Discharge        Equipment Recommendations  None recommended by PT    Recommendations for Other Services OT consult  Frequency Min 3X/week   Plan Discharge plan remains appropriate    Precautions / Restrictions Precautions Precautions: Sternal;Fall Restrictions Weight Bearing Restrictions: No   Pertinent Vitals/Pain     Mobility  Bed Mobility Bed Mobility: Sit to Supine Sit to Supine: 4: Min assist Details for Bed Mobility Assistance: vc's for technique to decr use of arms Transfers Transfers: Sit to Stand;Stand to Sit Sit to Stand: 4: Min assist;From chair/3-in-1;Other (comment) (min guard form higher BSC) Stand to Sit: 4: Min guard;To bed;To chair/3-in-1 Details for Transfer Assistance: Verbal cues for techniques to follow sternal precautions. Ambulation/Gait Ambulation/Gait Assistance: 4: Min assist;4: Min Government social research officer (Feet): 160 Feet Assistive device: Other (Comment) (pushing W/C) Ambulation/Gait Assistance Details: mildly unsteady through out with short step length and tendency to wander L and R Gait Pattern: Step-through pattern;Decreased stride length;Trunk flexed    Exercises     PT Diagnosis:    PT Problem List:   PT Treatment Interventions:     PT Goals Acute Rehab PT Goals PT Goal Formulation: With patient Time For Goal Achievement: 09/05/11 Potential to Achieve Goals: Good PT Goal: Sit to Supine/Side - Progress: Progressing toward goal PT Goal: Sit to Stand - Progress: Progressing toward goal PT Goal: Stand to Sit - Progress:  Progressing toward goal PT Goal: Ambulate - Progress: Progressing toward goal  Visit Information  Last PT Received On: 08/30/11 Assistance Needed: +2 (lines)    Subjective Data      Cognition  Overall Cognitive Status: Appears within functional limits for tasks assessed/performed Arousal/Alertness: Awake/alert Orientation Level: Appears intact for tasks assessed Behavior During Session: [facility] for tasks performed    Balance  Balance Balance Assessed: Yes Dynamic Sitting Balance Dynamic Sitting - Balance Support: No upper extremity supported;Feet supported;During functional activity (solid symmetrical scoot) Dynamic Sitting - Level of Assistance: 5: Stand by assistance Static Standing Balance Static Standing - Balance Support: Bilateral upper extremity supported Static Standing - Level of Assistance: 5: Stand by assistance  End of Session PT - End of Session Equipment Utilized During Treatment: Gait belt Activity Tolerance: Patient tolerated treatment well Patient left: in bed;with call bell/phone within reach Nurse Communication: Mobility status   GP     [name], Eliseo Gum 08/30/2011, 2:30 PM  08/30/2011  [place], PT [phone] (pager)

## 2011-08-31 ENCOUNTER — Inpatient Hospital Stay (HOSPITAL_COMMUNITY): Payer: Medicare Other

## 2011-08-31 LAB — GLUCOSE, CAPILLARY
Glucose-Capillary: 77 mg/dL (ref 70–99)
Glucose-Capillary: 108 mg/dL — ABNORMAL HIGH (ref 70–99)
Glucose-Capillary: 98 mg/dL (ref 70–99)

## 2011-08-31 LAB — BASIC METABOLIC PANEL
BUN: 23 mg/dL (ref 6–23)
CO2: 25 mEq/L (ref 19–32)
Calcium: 8.2 mg/dL — ABNORMAL LOW (ref 8.4–10.5)
Chloride: 100 mEq/L (ref 96–112)
Creatinine, Ser: 0.64 mg/dL (ref 0.50–1.35)
GFR calc Af Amer: >90 mL/min (ref >90)
GFR calc non Af Amer: >90 mL/min (ref >90)
Glucose, Bld: 103 mg/dL — ABNORMAL HIGH (ref 70–99)
Potassium: 4 mEq/L (ref 3.5–5.1)
Sodium: 133 mEq/L — ABNORMAL LOW (ref 135–145)

## 2011-08-31 LAB — CBC
HCT: 26 % — ABNORMAL LOW (ref 39.0–52.0)
Hemoglobin: 8.9 g/dL — ABNORMAL LOW (ref 13.0–17.0)
MCH: 30.3 pg (ref 26.0–34.0)
MCHC: 34.2 g/dL (ref 30.0–36.0)
MCV: 88.4 fL (ref 78.0–100.0)
Platelets: 281 10*3/uL (ref 150–400)
RBC: 2.94 MIL/uL — ABNORMAL LOW (ref 4.22–5.81)
RDW: 14.2 % (ref 11.5–15.5)
WBC: 8.9 10*3/uL (ref 4.0–10.5)

## 2011-08-31 MED ORDER — SODIUM CHLORIDE 0.9 % IV SOLN
250.0000 mL | INTRAVENOUS | Status: DC | PRN
  Administered 2011-09-01 – 2011-09-04 (×2): 250 mL via INTRAVENOUS

## 2011-08-31 MED ORDER — FUROSEMIDE 10 MG/ML IJ SOLN
40.0000 mg | Freq: Once | INTRAMUSCULAR | Status: AC
  Administered 2011-08-31: 40 mg via INTRAVENOUS

## 2011-08-31 MED ORDER — MOVING RIGHT ALONG BOOK
Freq: Once | Status: AC
  Administered 2011-08-31: 10:00:00
  Filled 2011-08-31: qty 1

## 2011-08-31 MED ORDER — DIPHENHYDRAMINE HCL 25 MG PO CAPS: 25.0000 mg | ORAL_CAPSULE | Freq: Every evening | ORAL | Status: DC | PRN

## 2011-08-31 MED ORDER — SODIUM CHLORIDE 0.9 % IJ SOLN
3.0000 mL | Freq: Two times a day (BID) | INTRAMUSCULAR | Status: DC
  Administered 2011-08-31 – 2011-09-02 (×3): 3 mL via INTRAVENOUS
  Administered 2011-09-03: 11:00:00 via INTRAVENOUS
  Administered 2011-09-03: 3 mL via INTRAVENOUS
  Administered 2011-09-04: 10 mL via INTRAVENOUS
  Administered 2011-09-04: 3 mL via INTRAVENOUS

## 2011-08-31 MED ORDER — SODIUM CHLORIDE 0.9 % IJ SOLN: 3.0000 mL | INTRAMUSCULAR | Status: DC | PRN

## 2011-08-31 MED ORDER — FUROSEMIDE 10 MG/ML IJ SOLN
INTRAMUSCULAR | Status: AC
  Administered 2011-08-31: 40 mg via INTRAVENOUS
  Filled 2011-08-31: qty 4

## 2011-08-31 MED ORDER — ASPIRIN EC 325 MG PO TBEC
325.0000 mg | DELAYED_RELEASE_TABLET | Freq: Every day | ORAL | Status: DC
  Administered 2011-09-01 – 2011-09-08 (×8): 325 mg via ORAL
  Filled 2011-08-31 (×9): qty 1

## 2011-08-31 MED ORDER — FUROSEMIDE 10 MG/ML IJ SOLN
20.0000 mg | Freq: Two times a day (BID) | INTRAMUSCULAR | Status: DC
  Filled 2011-08-31: qty 2

## 2011-08-31 MED ORDER — DIPHENHYDRAMINE HCL 25 MG PO CAPS
25.0000 mg | ORAL_CAPSULE | Freq: Four times a day (QID) | ORAL | Status: DC | PRN
  Administered 2011-08-31: 25 mg via ORAL
  Filled 2011-08-31: qty 1

## 2011-08-31 MED ORDER — POTASSIUM CHLORIDE CRYS ER 20 MEQ PO TBCR
20.0000 meq | EXTENDED_RELEASE_TABLET | Freq: Once | ORAL | Status: AC
  Administered 2011-08-31: 20 meq via ORAL
  Filled 2011-08-31: qty 1

## 2011-08-31 MED ORDER — METOPROLOL TARTRATE 12.5 MG HALF TABLET
12.5000 mg | ORAL_TABLET | Freq: Two times a day (BID) | ORAL | Status: DC
  Administered 2011-08-31 – 2011-09-01 (×2): 12.5 mg via ORAL
  Filled 2011-08-31 (×3): qty 1

## 2011-08-31 MED ORDER — GUAIFENESIN ER 600 MG PO TB12
600.0000 mg | ORAL_TABLET | Freq: Two times a day (BID) | ORAL | Status: DC | PRN
  Administered 2011-08-31 – 2011-09-08 (×2): 600 mg via ORAL
  Filled 2011-08-31 (×2): qty 1

## 2011-08-31 MED ORDER — INSULIN ASPART 100 UNIT/ML ~~LOC~~ SOLN
0.0000 [IU] | Freq: Three times a day (TID) | SUBCUTANEOUS | Status: DC
  Administered 2011-08-31 – 2011-09-07 (×8): 2 [IU] via SUBCUTANEOUS

## 2011-08-31 MED ORDER — FUROSEMIDE 20 MG PO TABS
20.0000 mg | ORAL_TABLET | Freq: Every day | ORAL | Status: DC
  Administered 2011-08-31: 20 mg via ORAL
  Filled 2011-08-31 (×2): qty 1

## 2011-08-31 NOTE — Progress Notes (Signed)
Up in chair eating dinner BP 126/71  Pulse 81  Temp 97.7 F (36.5 C) (Oral)  Resp 21  Ht 5\' 10"  (1.778 m)  Wt 215 lb 13.3 oz (97.9 kg)  BMI 30.97 kg/m2  SpO2 100%   Intake/Output Summary (Last 24 hours) at 08/31/11 1752 Last data filed at 08/31/11 1500  Gross per 24 hour  Intake 922.89 ml  Output    950 ml  Net -27.11 ml    Kept in unit because of SOB, given additional lasix

## 2011-08-31 NOTE — Progress Notes (Signed)
Subjective:  Patient is doing very well . Has walked in hall.  The patient has been going back and forth between atrial fibrillation and normal sinus rhythm.  The patient did not rest well last night.  His laxative worked too well.  Objective:  Vital Signs in the last 24 hours: Temp:  [97.5 F (36.4 C)-98.6 F (37 C)] 98.5 F (36.9 C) (08/07 0754) Pulse Rate:  [35-97] 93  (08/07 0900) Resp:  [15-25] 25  (08/07 0900) BP: (93-114)/(35-65) 113/58 mmHg (08/07 0900) SpO2:  [91 %-100 %] 98 % (08/07 0900) Weight:  [215 lb 13.3 oz (97.9 kg)] 215 lb 13.3 oz (97.9 kg) (08/07 0500)  Intake/Output from previous day: 08/06 0701 - 08/07 0700 In: 1188 [P.O.:550; I.V.:538; IV Piggyback:100] Out: 800 [Urine:800] Intake/Output from this shift: Total I/O In: 250 [P.O.:200; I.V.:50] Out: -      . acetaminophen  1,000 mg Oral Q6H   Or  . acetaminophen (TYLENOL) oral liquid 160 mg/5 mL  975 mg Per Tube Q6H  . amiodarone  400 mg Oral BID  . aspirin EC  325 mg Oral Daily   Or  . aspirin  324 mg Per Tube Daily  . enoxaparin  30 mg Subcutaneous Q24H  . feeding supplement  237 mL Oral BID BM  . furosemide  20 mg Intravenous BID  . insulin aspart  0-24 Units Subcutaneous Q4H  . insulin regular  0-10 Units Intravenous TID WC  . metoprolol tartrate  12.5 mg Oral BID   Or  . metoprolol tartrate  12.5 mg Per Tube BID  . pantoprazole  40 mg Oral Q1200  . potassium chloride  20 mEq Oral Once  . simvastatin  5 mg Oral q1800  . sodium chloride  3 mL Intravenous Q12H  . DISCONTD: bisacodyl  10 mg Oral Daily  . DISCONTD: bisacodyl  10 mg Rectal Daily  . DISCONTD: docusate sodium  200 mg Oral Daily      . sodium chloride Stopped (08/28/11 1500)  . sodium chloride Stopped (08/25/11 1530)  . sodium chloride    . lactated ringers 20 mL/hr at 08/31/11 0800  . phenylephrine (NEO-SYNEPHRINE) Adult infusion Stopped (08/28/11 1700)    Physical Exam: The patient appears to be in no distress.  Chest  reveals mild rhonchi.  Heart reveals regular rhythm at the present time.  No aortic insufficiency  The abdomen is soft and nontender.  Bowel sounds are normoactive.  There is no hepatosplenomegaly or mass.  There are no abdominal bruits.  Extremities reveal mild-to- moderate pretibial and pedal edema.    Neurologic exam is normal strength and no lateralizing weakness.  No sensory deficits.  Integument reveals no rash  Lab Results:  Basename 08/31/11 0410 08/30/11 0445  WBC 8.9 9.3  HGB 8.9* 8.6*  PLT 281 210    Basename 08/31/11 0410 08/30/11 0445  NA 133* 134*  K 4.0 3.6  CL 100 102  CO2 25 25  GLUCOSE 103* 103*  BUN 23 28*  CREATININE 0.64 0.65   No results found for this basename: TROPONINI:2,CK,MB:2 in the last 72 hours Hepatic Function Panel  Basename 08/29/11 0428  PROT 5.2*  ALBUMIN 2.4*  AST 71*  ALT 141*  ALKPHOS 57  BILITOT 0.4  BILIDIR --  IBILI --   No results found for this basename: CHOL in the last 72 hours No results found for this basename: PROTIME in the last 72 hours  Imaging: Dg Chest Port 1 View  08/31/2011  *  RADIOLOGY REPORT*  Clinical Data: Status post CABG.  PORTABLE CHEST - 1 VIEW  Comparison: Plain film chest 08/29/2011.  Findings: Left IJ sheath and left chest tube have been removed.  No pneumothorax is identified.  Small bilateral effusions and bibasilar atelectasis persist.  Basilar atelectasis has improved.  IMPRESSION:  1.  Status post left chest tube and left IJ sheath removal.  No pneumothorax. 2.  Improved bibasilar atelectasis with a small bilateral effusions noted.  Original Report Authenticated By: Bernadene Bell. Maricela Curet, M.D.    Cardiac Studies:  Assessment/Plan:  S/P aortic valve replacement and CABG doing well. Paroxysmal atrial fibrillation on amiodarone and beta blocker  LOS: 8 days    Cassell Clement 08/31/2011, 9:29 AM

## 2011-08-31 NOTE — Progress Notes (Addendum)
6 Days Post-Op Procedure(s) (LRB): CORONARY ARTERY BYPASS GRAFTING (CABG) (N/A) AORTIC VALVE REPLACEMENT (AVR) (N/A)  Subjective: Patient with difficulty sleeping and loose stools.  Objective: Vital signs in last 24 hours: Patient Vitals for the past 24 hrs:  BP Temp Temp src Pulse Resp SpO2 Weight  08/31/11 0800 112/54 mmHg - - 86  23  100 % -  08/31/11 0754 - 98.5 F (36.9 C) Oral - - - -  08/31/11 0700 106/56 mmHg - - 86  23  100 % -  08/31/11 0600 108/54 mmHg - - 96  22  98 % -  08/31/11 0500 96/47 mmHg - - 87  19  100 % 215 lb 13.3 oz (97.9 kg)  08/31/11 0400 114/61 mmHg 97.6 F (36.4 C) Oral 90  17  100 % -  08/31/11 0300 106/52 mmHg - - 87  23  100 % -  08/31/11 0200 108/56 mmHg - - 82  23  100 % -  08/31/11 0100 93/49 mmHg - - 82  15  99 % -  08/31/11 0000 96/35 mmHg 97.8 F (36.6 C) Oral 84  24  91 % -  08/30/11 2300 114/65 mmHg - - 80  21  100 % -  08/30/11 2200 104/48 mmHg - - 35  - 100 % -  08/30/11 2100 104/52 mmHg - - 83  21  99 % -  08/30/11 2000 103/60 mmHg - - 83  25  100 % -  08/30/11 1913 - 98.6 F (37 C) Oral - - - -  08/30/11 1900 107/54 mmHg - - 82  23  100 % -  08/30/11 1800 - - - 97  20  99 % -  08/30/11 1700 105/54 mmHg - - 77  21  98 % -  08/30/11 1600 106/55 mmHg - - 78  21  100 % -  08/30/11 1514 - 97.7 F (36.5 C) Oral - - - -  08/30/11 1500 97/45 mmHg - - 75  22  99 % -  08/30/11 1400 - - - 87  25  97 % -  08/30/11 1300 104/39 mmHg - - 82  16  99 % -  08/30/11 1200 104/52 mmHg - - 80  23  98 % -  08/30/11 1107 - 97.5 F (36.4 C) Oral - - - -  08/30/11 1100 99/52 mmHg - - 78  19  99 % -  08/30/11 1000 110/47 mmHg - - 80  20  100 % -  08/30/11 0900 104/46 mmHg - - 80  22  97 % -   Pre op weight  81.4kg Current Weight  08/31/11 215 lb 13.3 oz (97.9 kg)      Intake/Output from previous day: 08/06 0701 - 08/07 0700 In: 1188 [P.O.:550; I.V.:538; IV Piggyback:100] Out: 800 [Urine:800]   Physical Exam:  Cardiovascular: RRR;rub; no  murmurs, gallops. Pulmonary: Slightly diminished at bases; no rales, wheezes, or rhonchi. Abdomen: Soft, non tender, bowel sounds present. Extremities: 2+ bilateral lower extremity edema. Wounds: Clean and dry.  No erythema or signs of infection.  Lab Results: CBC: Basename 08/31/11 0410 08/30/11 0445  WBC 8.9 9.3  HGB 8.9* 8.6*  HCT 26.0* 25.1*  PLT 281 210   BMET:  Basename 08/31/11 0410 08/30/11 0445  NA 133* 134*  K 4.0 3.6  CL 100 102  CO2 25 25  GLUCOSE 103* 103*  BUN 23 28*  CREATININE 0.64 0.65  CALCIUM 8.2* 7.7*    PT/INR:  Lab Results  Component Value Date   INR 1.89* 08/25/2011   ABG:  INR: Will add last result for INR, ABG once components are confirmed Will add last 4 CBG results once components are confirmed  Assessment/Plan:  1. CV - Previous afib.SR but does go back into afib with CVR. Continue Lopressor 12.5 bid and Amiodarone 400 bid.May need Coumadin. 2.  Pulmonary - Encourage incentive spirometer.CXR this am no pneumothorax, bibasilar atelectasis (improved) and small bilateral pleural effusions. 3. Volume Overload - Begin diuresis. 4.  Acute blood loss anemia - H and H stable this am at 8.9 and 26. 5.Benadryl PRN sleep. 6.Mucinex PRN cough. 7.Stop laxatives as with loose stools. 8.Hope to transfer to PCTU soon.  ZIMMERMAN,DONIELLE MPA-C 08/31/2011   currently sinus, if continues in and out of afib will need to decide about use of coumadin To circle I have seen and examined [name] and agree with the above assessment  and plan.  Delight Ovens MD Beeper [phone] Office [phone] 08/31/2011 10:06 AM

## 2011-09-01 ENCOUNTER — Inpatient Hospital Stay (HOSPITAL_COMMUNITY): Payer: Medicare Other

## 2011-09-01 DIAGNOSIS — I5023 Acute on chronic systolic (congestive) heart failure: Secondary | ICD-10-CM

## 2011-09-01 DIAGNOSIS — I472 Ventricular tachycardia: Secondary | ICD-10-CM

## 2011-09-01 DIAGNOSIS — I4891 Unspecified atrial fibrillation: Secondary | ICD-10-CM

## 2011-09-01 DIAGNOSIS — I4901 Ventricular fibrillation: Secondary | ICD-10-CM

## 2011-09-01 LAB — BASIC METABOLIC PANEL
BUN: 21 mg/dL (ref 6–23)
CO2: 25 mEq/L (ref 19–32)
Calcium: 8 mg/dL — ABNORMAL LOW (ref 8.4–10.5)
Chloride: 102 mEq/L (ref 96–112)
Creatinine, Ser: 0.71 mg/dL (ref 0.50–1.35)
GFR calc Af Amer: >90 mL/min (ref >90)
GFR calc non Af Amer: 87 mL/min — ABNORMAL LOW (ref >90)
Glucose, Bld: 123 mg/dL — ABNORMAL HIGH (ref 70–99)
Potassium: 4.2 mEq/L (ref 3.5–5.1)
Sodium: 136 mEq/L (ref 135–145)

## 2011-09-01 LAB — CARDIAC PANEL(CRET KIN+CKTOT+MB+TROPI)
CK, MB: 4.6 ng/mL — ABNORMAL HIGH (ref 0.3–4.0)
CK, MB: 5.4 ng/mL — ABNORMAL HIGH (ref 0.3–4.0)
Relative Index: INVALID (ref 0.0–2.5)
Total CK: 74 U/L (ref 7–232)
Total CK: 73 U/L (ref 7–232)
Troponin I: 0.93 ng/mL (ref <0.30)

## 2011-09-01 LAB — GLUCOSE, CAPILLARY
Glucose-Capillary: 132 mg/dL — ABNORMAL HIGH (ref 70–99)
Glucose-Capillary: 110 mg/dL — ABNORMAL HIGH (ref 70–99)

## 2011-09-01 LAB — CBC
HCT: 24.3 % — ABNORMAL LOW (ref 39.0–52.0)
Hemoglobin: 8.3 g/dL — ABNORMAL LOW (ref 13.0–17.0)
MCH: 30.1 pg (ref 26.0–34.0)
MCHC: 34.2 g/dL (ref 30.0–36.0)
MCV: 88 fL (ref 78.0–100.0)
Platelets: 285 10*3/uL (ref 150–400)
RBC: 2.76 MIL/uL — ABNORMAL LOW (ref 4.22–5.81)
RDW: 14.5 % (ref 11.5–15.5)
WBC: 9.8 10*3/uL (ref 4.0–10.5)

## 2011-09-01 LAB — POCT I-STAT 3, ART BLOOD GAS (G3+)
TCO2: 24 mmol/L (ref 0–100)
pCO2 arterial: 37.2 mmHg (ref 35.0–45.0)
pH, Arterial: 7.397 (ref 7.350–7.450)

## 2011-09-01 LAB — MAGNESIUM: Magnesium: 2.3 mg/dL (ref 1.5–2.5)

## 2011-09-01 MED ORDER — AMIODARONE IV BOLUS ONLY 150 MG/100ML
150.0000 mg | Freq: Once | INTRAVENOUS | Status: AC
  Administered 2011-09-01: 150 mg via INTRAVENOUS

## 2011-09-01 MED ORDER — AMIODARONE HCL IN DEXTROSE 360-4.14 MG/200ML-% IV SOLN
INTRAVENOUS | Status: AC
  Administered 2011-09-01: 60 mg/h via INTRAVENOUS
  Filled 2011-09-01: qty 200

## 2011-09-01 MED ORDER — POLYSACCHARIDE IRON COMPLEX 150 MG PO CAPS
150.0000 mg | ORAL_CAPSULE | Freq: Every day | ORAL | Status: DC
  Administered 2011-09-01 – 2011-09-08 (×8): 150 mg via ORAL
  Filled 2011-09-01 (×8): qty 1

## 2011-09-01 MED ORDER — LEVALBUTEROL HCL 0.63 MG/3ML IN NEBU
0.6300 mg | INHALATION_SOLUTION | Freq: Four times a day (QID) | RESPIRATORY_TRACT | Status: DC
Start: 2011-09-01 — End: 2011-09-05
  Administered 2011-09-01 – 2011-09-05 (×15): 0.63 mg via RESPIRATORY_TRACT
  Filled 2011-09-01 (×19): qty 3

## 2011-09-01 MED ORDER — FUROSEMIDE 10 MG/ML IJ SOLN
80.0000 mg | Freq: Once | INTRAMUSCULAR | Status: AC
  Administered 2011-09-01: 80 mg via INTRAVENOUS
  Filled 2011-09-01: qty 8

## 2011-09-01 MED ORDER — POTASSIUM CHLORIDE CRYS ER 20 MEQ PO TBCR
20.0000 meq | EXTENDED_RELEASE_TABLET | Freq: Two times a day (BID) | ORAL | Status: AC
  Administered 2011-09-01: 20 meq via ORAL
  Filled 2011-09-01: qty 1
  Filled 2011-09-01: qty 2
  Filled 2011-09-01 (×2): qty 1

## 2011-09-01 MED ORDER — POTASSIUM CHLORIDE CRYS ER 20 MEQ PO TBCR
40.0000 meq | EXTENDED_RELEASE_TABLET | Freq: Once | ORAL | Status: AC
  Administered 2011-09-01: 40 meq via ORAL

## 2011-09-01 MED ORDER — FOLIC ACID 1 MG PO TABS
1.0000 mg | ORAL_TABLET | Freq: Every day | ORAL | Status: DC
  Administered 2011-09-01 – 2011-09-08 (×8): 1 mg via ORAL
  Filled 2011-09-01 (×8): qty 1

## 2011-09-01 MED ORDER — EPINEPHRINE HCL 0.1 MG/ML IJ SOLN
0.5000 mg | Freq: Once | INTRAMUSCULAR | Status: AC
  Administered 2011-09-01: 0.5 mg via INTRAVENOUS

## 2011-09-01 MED ORDER — FUROSEMIDE 10 MG/ML IJ SOLN
INTRAMUSCULAR | Status: AC
  Administered 2011-09-01: 40 mg via INTRAVENOUS
  Filled 2011-09-01: qty 4

## 2011-09-01 MED ORDER — METOPROLOL TARTRATE 25 MG PO TABS
25.0000 mg | ORAL_TABLET | Freq: Two times a day (BID) | ORAL | Status: DC
  Administered 2011-09-01: 25 mg via ORAL
  Filled 2011-09-01 (×4): qty 1

## 2011-09-01 MED ORDER — AMIODARONE HCL IN DEXTROSE 360-4.14 MG/200ML-% IV SOLN
60.0000 mg/h | INTRAVENOUS | Status: DC
  Administered 2011-09-01 (×2): 60 mg/h via INTRAVENOUS
  Administered 2011-09-01: 30.6 mg/h via INTRAVENOUS
  Filled 2011-09-01 (×10): qty 200

## 2011-09-01 MED ORDER — FUROSEMIDE 10 MG/ML IJ SOLN
40.0000 mg | Freq: Two times a day (BID) | INTRAMUSCULAR | Status: AC
  Administered 2011-09-01 (×2): 40 mg via INTRAVENOUS

## 2011-09-01 NOTE — Progress Notes (Signed)
CTSP for V Fib arrest. Approximately 0300 Gabriel Marsh began having runs of NSVT then had v fib SICU staff began compressions and defibrillated patient within a couple of minutes. ROSC after 1st defib He did receive epi and was bolused with amiodarone and started on an amiodarone gtt Currently awake and alert and in NAD. He denies CP, incisional pain, nausea and SOB  94 SR with PVCs 92/68 RR 26 Lungs equal BS bilaterally no wheezing Cardiac slightly irregular, faint murmur  ECG shows improvement in LBBB, no definite ischemia K= 4.2, Mg 2.3  Will continue amiodarone gtt Cycle cardiac enzymes Consider cath to look at grafts in AM

## 2011-09-01 NOTE — Progress Notes (Signed)
Patient had frequent PVCs during the night which evolved into sustained VT and VF. Was defibrillated x1 with return of spontaneous circulation. Presently in NSR and back on IV amiodarone.  EP to see.

## 2011-09-01 NOTE — Progress Notes (Addendum)
7 Days Post-Op Procedure(s) (LRB): CORONARY ARTERY BYPASS GRAFTING (CABG) (N/A) AORTIC VALVE REPLACEMENT (AVR) (N/A)  Subjective: Patient with shortness of breath this am.  Objective: Vital signs in last 24 hours: Patient Vitals for the past 24 hrs:  BP Temp Temp src Pulse Resp SpO2 Weight  09/01/11 0800 116/67 mmHg - - 86  26  100 % -  09/01/11 0725 - 98.6 F (37 C) Oral - - - -  09/01/11 0700 110/71 mmHg - - 86  25  100 % -  09/01/11 0600 121/63 mmHg - - 84  24  100 % -  09/01/11 0500 110/64 mmHg - - 81  20  100 % 205 lb 7.5 oz (93.2 kg)  09/01/11 0430 113/67 mmHg - - 87  24  100 % -  09/01/11 0400 116/64 mmHg 97.9 F (36.6 C) Oral 89  23  100 % -  09/01/11 0345 114/68 mmHg - - 93  23  100 % -  09/01/11 0330 123/71 mmHg - - 99  28  100 % -  09/01/11 0300 105/65 mmHg - - 84  22  100 % -  09/01/11 0200 108/64 mmHg - - 86  22  97 % -  09/01/11 0100 93/44 mmHg - - 81  23  100 % -  09/01/11 0000 109/42 mmHg - - 81  22  100 % -  08/31/11 2300 106/55 mmHg - - 80  20  100 % -  08/31/11 2200 116/83 mmHg - - 81  19  100 % -  08/31/11 2105 114/68 mmHg - - 92  - - -  08/31/11 2100 - - - 88  19  100 % -  08/31/11 2000 116/64 mmHg - - 82  22  100 % -  08/31/11 1932 - 97.8 F (36.6 C) Oral - - - -  08/31/11 1900 - - - 85  24  100 % -  08/31/11 1800 121/62 mmHg - - 90  25  100 % -  08/31/11 1700 111/71 mmHg - - 89  21  100 % -  08/31/11 1637 - 97.7 F (36.5 C) Oral - - - -  08/31/11 1600 126/71 mmHg - - 81  22  100 % -  08/31/11 1500 116/54 mmHg - - - 21  - -  08/31/11 1400 124/61 mmHg - - 81  21  100 % -  08/31/11 1300 116/61 mmHg - - 83  25  100 % -  08/31/11 1200 115/65 mmHg - - 84  23  100 % -  08/31/11 1132 - 97.4 F (36.3 C) Oral - - - -  08/31/11 1100 - - - 92  24  100 % -  08/31/11 1000 113/58 mmHg - - 93  23  100 % -  08/31/11 0900 113/58 mmHg - - 93  25  98 % -   Pre op weight  81.4kg Current Weight  09/01/11 205 lb 7.5 oz (93.2 kg)      Intake/Output from previous  day: 08/07 0701 - 08/08 0700 In: 1033.9 [P.O.:750; I.V.:279.9; IV Piggyback:4] Out: 1950 [Urine:1950]   Physical Exam:  Cardiovascular: RRR;rub; no murmurs, gallops. Pulmonary: Crackles; no rales, wheezes, or rhonchi. Abdomen: Soft, non tender, bowel sounds present. Extremities: 2+ bilateral lower extremity edema. Wounds: Clean and dry.  No erythema or signs of infection. Sternum is stable.  Lab Results: CBC:  Basename 09/01/11 0250 08/31/11 0410  WBC 9.8 8.9  HGB 8.3* 8.9*  HCT 24.3*  26.0*  PLT 285 281   BMET:   Basename 09/01/11 0250 08/31/11 0410  NA 136 133*  K 4.2 4.0  CL 102 100  CO2 25 25  GLUCOSE 123* 103*  BUN 21 23  CREATININE 0.71 0.64  CALCIUM 8.0* 8.2*    PT/INR:  Lab Results  Component Value Date   INR 1.89* 08/25/2011   ABG:  INR: Will add last result for INR, ABG once components are confirmed Will add last 4 CBG results once components are confirmed  Assessment/Plan:  1. CV - Previous afib.SR but does go back into afib with CVR. At around 3 am, he had runs of NSVT then Vfib.CPR was started. He received epi and amiodarone.ROSC after first defibrillation.On Amiodarone gttp and scheduled to get po amiodarone later this am.Will defer to cardiology.Continue Lopressor 12.5 bid.Will have EPS evaluate. 2.  Pulmonary - Encourage incentive spirometer.CXR this am shows no pneumothorax, cardiomegaly, increased pulmonary vascular congestion and edema. 3. Volume Overload - Lasix IV. 4.  Acute blood loss anemia - H and H stable this am at 8.3 and 24.3.Will start Nu iron and folic acid.   ZIMMERMAN,DONIELLE MPA-C  09/01/2011 8:11 AM   Further lasix  today Will have EP to see and manage dosing of antiarrythmic Keep in unit today Sternum stable, and patient neuro intact I have seen and examined [name] and agree with the above assessment  and plan.  Delight Ovens MD Beeper [phone] Office [phone] 09/01/2011 8:41 AM

## 2011-09-01 NOTE — Progress Notes (Addendum)
Critical Care Note  I was on the unit when Mr. Murri went into pulseless VT.  He was noted on the monitor to have a few runs of NSVT prior to going into sustained VT.  It did appear polymorphic.  He received approximately 2 minutes of chest compressions while pads were attached and he was successfully defibrillated with 200J.  He also received 0.5mg  Epinephrine.  He had return of spontaneous circulation and began to respond.  He was bolused with 150mg  Amio and ggt was begun.  Repeat EKG was reviewed by myself and Dr. Herbie Baltimore and  did not demonstrate injury.  ABG showed normal pH and adequate oxygenation.  CTS will evaluate as well.  Currently he is answering all questions well and denies chest pain.  May need cath at some point to eval grafts

## 2011-09-01 NOTE — Progress Notes (Signed)
Patient's niece Rhunette Croft updated of night's events and patient's condition via phone.  Very appreciative of phone call.

## 2011-09-01 NOTE — Progress Notes (Signed)
CRITICAL VALUE ALERT  Critical value received:  Troponin 1.16  Date of notification:  09/01/2011  Time of notification: 0450  Critical value read back: yes  Nurse who received alert: Andres Shad RN  MD notified (1st page):  Dorris Fetch   Time of first page:   MD notified (2nd page):  Time of second page:  Responding MD:   Time MD responded:

## 2011-09-01 NOTE — Progress Notes (Signed)
At approximatley 230 this morning patient began to have frequent runs of NSVT, at approximatley 300am patient went in to v-fib arrest. CPR was initiated and one shock administered. Amiodarone bolus and epinephrine given, Amio gtt started.  Pt. Returned to  baseline rhythm. Patient alert and oriented x4 VS HR 84, O2 100% on Venti mask, BP 109/66, currently patient is stable and has no complaints of pain. We will continue to Monitor.

## 2011-09-01 NOTE — Progress Notes (Signed)
PT Cancellation Note  Treatment cancelled today due to noted Cardiac events earlier this am.  Will hold PT at this time and check back another time.    Gabriel Marsh, [place] [phone] 09/01/2011, 10:38 AM

## 2011-09-01 NOTE — Consult Note (Signed)
ELECTROPHYSIOLOGY CONSULT NOTE  Patient ID: Gabriel Marsh MRN: [id number], DOB/AGE: Nov 12, 1930   Admit date: 08/23/2011 Date of Consult: 09/01/2011  Primary Physician: Rush Landmark, MD at [facility] Primary Cardiologist: Patty Sermons, MD Reason for Consultation: VF arrest  History of Present Illness Gabriel Marsh is a pleasant 78 year old gentleman with severe AS, 3V CAD, LV dysfunction EF 25% (by echo here on 08/24/2011), HTN and dyslipidemia who was admitted 08/23/2011 with acute systolic CHF and NSTEMI.  He has had an extensive hospitalization as reviewed in epic.   He underwent CABG x3 (LIMA to LAD, SVG to ramus, SVG to RCA) and AVR with pericardial tissue valve on 08/25/2011. Perioperatively he developed cardiogenic shock requiring intraaortic balloon pump and IV pressors. He was extubated on 08/27/2011. Post-op day #4 he developed atrial fibrillation and was started on amiodarone. He was not given a beta blocker due to hypotension. His BP stablized and he was taken off pressors on 08/30/2011. Metoprolol was added yesterday, 08/31/2011. Early this AM, ~3:00 AM, he suffered VF arrest. CPR was briefly administered and he was defibrillated x1 with ROSC. He has been mentating normally. His K was 4.2, Mg 2.3. He was continued on amiodarone and metoprolol. He has continued to have volume overload and SOB post-operatively. He is receiving IV lasix. We have been asked to see him in consultation for EP recommendations regarding cardiac rhythm management.  Past Medical History  Diagnosis Date  . Hypertension   . Hyperlipidemia   . Mitral regurgitation     Moderate by 08/20/11 echo  . Shortness of breath   . CAD (coronary artery disease)     a) 08/22/11 cath :  50-60% left main stenosis, mild-moderate LAD disease, 99% ostial large diagonal stenosis, occluded and collateralized LCx and RCA  . Aortic stenosis     Severe by 08/20/11 echo  . Aortic insufficiency     Moderate by 08/20/11 echo  . Chronic systolic  CHF (congestive heart failure)     a) 08/20/11 echo : LVEF 35-40%    Past Surgical History  Procedure Date  . Eye surgery   . Cardiac catheterization 08/22/11     50-60% left main stenosis, mild-moderate LAD disease, 99% ostial large diagonal stenosis, occluded and collateralized LCx and RCA  . Coronary artery bypass graft 08/25/2011    Procedure: CORONARY ARTERY BYPASS GRAFTING (CABG);  Surgeon: Delight Ovens, MD;  Location: [facility] OR;  Service: Open Heart Surgery;  Laterality: N/A;  Hot and humid all through surgery.  . Aortic valve replacement 08/25/2011    Procedure: AORTIC VALVE REPLACEMENT (AVR);  Surgeon: Delight Ovens, MD;  Location: [facility] OR;  Service: Open Heart Surgery;  Laterality: N/A;  hot and humid all through surgery.    Allergies/Intolerances Allergen Reactions  . Fish Allergy   . Lisinopril     Cough  . Penicillins     Hives  . Statins     Leg cramps  . Sulfa Antibiotics     Hives   Inpatient Medications . amiodarone  150 mg Intravenous Once  . amiodarone  400 mg Oral BID  . aspirin EC  325 mg Oral Daily  . enoxaparin  30 mg Subcutaneous Q24H  . epinephrine  0.5 mg Intravenous Once  . feeding supplement  237 mL Oral BID BM  . folic acid  1 mg Oral Daily  . furosemide  40 mg Intravenous Once  . furosemide  40 mg Intravenous BID  . insulin aspart  0-24 Units Subcutaneous  TID AC & HS  . iron polysaccharides  150 mg Oral Daily  . metoprolol tartrate  12.5 mg Oral BID  . potassium chloride  20 mEq Oral BID  . simvastatin  5 mg Oral q1800   . amiodarone (NEXTERONE PREMIX) 360 mg/200 mL dextrose 60 mg/hr (09/01/11 0917)   Family History Positive for CAD   Social History Social History  . Marital Status: Single   Social History Main Topics  . Smoking status: Former Smoker    Quit date: 01/25/1995  . Smokeless tobacco: Not on file  . Alcohol Use: No  . Drug Use: No   Review of Systems General: No chills, fever, night sweats or weight changes    Cardiovascular: No chest pain, orthopnea, palpitations, paroxysmal nocturnal dyspnea Dermatological: No rash, lesions or masses Respiratory: No cough Urologic: No hematuria, dysuria Abdominal: No nausea, vomiting, diarrhea, bright red blood per rectum, melena, or hematemesis Neurologic: No visual changes, weakness, changes in mental status All other systems reviewed and are otherwise negative except as noted above.  Physical Exam Vitals: Blood pressure 105/55, pulse 85, temperature 98.6 F (37 C), temperature source Oral, resp. rate 29, height 5\' 10"  (1.778 m), weight 205 lb 7.5 oz (93.2 kg), SpO2 100.00%.  General: Well developed, frail, ill appearing 78 year old male who is tachypneic but no acute distress. HEENT: Normocephalic, atraumatic. EOMs intact. Sclera nonicteric. Oropharynx clear.  Neck: Supple. No JVD. Lungs:  Respirations regular and somewhat labored. Bibasilar rales noted otherwise CTA bilaterally anteriorly. Heart: RRR. S1, S2 present. No murmurs, rub, S3 or S4. Abdomen: Soft, non-tender, non-distended. BS present x 4 quadrants. No hepatosplenomegaly.  Extremities: 2+ pedal edema bilaterally. No clubbing or cyanosis. DP/PT/Radials 2+ and equal bilaterally. Psych: Normal affect. Neuro: Alert and oriented X 3. Moves all extremities spontaneously.   Labs  [facility] 09/01/11 0250  CKTOTAL 74  CKMB 4.6*  TROPONINI 1.16*   Lab Results  Component Value Date   WBC 9.8 09/01/2011   HGB 8.3* 09/01/2011   HCT 24.3* 09/01/2011   MCV 88.0 09/01/2011   PLT 285 09/01/2011    Lab 09/01/11 0250 08/29/11 0428  NA 136 --  K 4.2 --  CL 102 --  CO2 25 --  BUN 21 --  CREATININE 0.71 --  CALCIUM 8.0* --  PROT -- 5.2*  BILITOT -- 0.4  ALKPHOS -- 57  ALT -- 141*  AST -- 71*  GLUCOSE 123* --    No components found with this basename: POCBNP:3 No results found for this basename: DDIMER    Radiology/Studies Dg Chest Port 1 View 09/01/2011  *RADIOLOGY REPORT*  Clinical Data: Post  CPR.  2-day post CABG.  PORTABLE CHEST - 1 VIEW  Comparison: 08/31/2011  Findings: Stable appearance of postoperative changes in the mediastinum.  Right central venous catheter is unchanged in position.  Cardiac enlargement with pulmonary vascular congestion and perihilar edema.  Infiltration or atelectasis in the lung bases.  Blunting of costophrenic angles suggesting small effusions. These changes are progressive since previous study.  No pneumothorax.  IMPRESSION: Cardiac enlargement with increased pulmonary vascular congestion, increased edema, and small bilateral pleural effusions with basilar atelectasis/infiltration.  Original Report Authenticated By: Marlon Pel, M.D.   Echocardiogram  Conclusions: - Left ventricle: The cavity size was severely dilated. Wall thickness was increased in a pattern of mild LVH. The estimated ejection fraction was 25%. Diffuse hypokinesis. - Aortic valve: Gradients a bit low for severe AS due to LV dysfunction There was severe stenosis.  Mild regurgitation. Valve area: 0.59cm^2(VTI). Valve area: 0.63cm^2 (Vmax). - Mitral valve: Mild regurgitation. - Left atrium: The atrium was mildly dilated. - Atrial septum: No defect or patent foramen ovale was identified.  12-lead ECG done pre-operatively on 08/24/2011 - sinus rhythm with first degree AV block, LBBB 12-lead ECG done this AM following VF arrest - sinus tachycardia with long first degree AV block, PR interval ~240 ms, and PVCs (computer read is incorrect - not junctional)  Qtc 525  Telemetry reviewed from earlier this AM which revealed several episodes of brady dependant nonsustained polymorphic VT followed by an episode of sustained torsades which degenerated into VF and required defibrillation x 1.  Presently sinus rhythm.  Assessment and Plan 1. VF arrest Mr. Bentson has survived VF arrest; therefore, he meets criteria for ICD implantation as secondary prevention of SCD. His post-operative course has  been complicated by cardiogenic shock, AFib and persistent CHF. We recommend continuing amiodarone and metoprolol at this time in addition to diuresis for volume management. Once more hemodynamically stable and euvolemic, consider ICD implantation..     Dr. Johney Frame to see and make further recommendations. Signed, Rick Duff, PA-C 09/01/2011, 11:05 AM    I have seen, examined the patient, and reviewed the above assessment and plan.  Changes to above are made where necessary.   The patient has had an extensive hospitalization following initial presentation with CHF, severe AS, and NSTEMI.  He is now recovering s/p AVR and CABG.  His EF is depressed chronically.  He is presently significantly volume overloaded and making slow recovery post operatively. This am, he had PVCs followed by "long short" coupled torsades.  He required defibrillation.  K and Mg were within the normal range.  His QTc post shock was prolonged at .  Though he denies ischemic symptoms and cardiac markers were not robustly elevated, I think that we have to consider graft closure as a possible cause for his arrhythmia.  Repeat cath should be considered to evaluate his bypass grafts.  At this point, I will continue IV amiodarone, though we will have to watch for further QT prolongation with amiodarone.   Amiodarone could be converted to PO tomorrow.  Avoid QT prolonging drugs, keep K>3.9 and Mg >1.9. Continue to titrate metoprolol as BP allows.  Once he is clinically improved, he should be offered an ICD.  Presently, he remains quite ill and volume overloaded.  EP will continue to follow the patient.  We will make a decision early next week as to whether he has an ICD implanted prior to discharge or whether he should be discharged with a Livevest in place and return as an outpatient electively for ICD implantation.      Co Sign: Hillis Range, MD 09/01/2011 8:50 PM

## 2011-09-01 NOTE — Progress Notes (Signed)
Patient ID: Gabriel Marsh, male   DOB: [birth year], 78 y.o.   MRN: [id number]  Filed Vitals:   09/01/11 1800 09/01/11 1900 09/01/11 1915 09/01/11 2000  BP: 120/68 129/75  125/66  Pulse: 82 86  84  Temp:   97.4 F (36.3 C)   TempSrc:   Oral   Resp: 23 25  24   Height:      Weight:      SpO2: 97% 99%  99%   Awake and alert  Wheezing. He diuresed some today but not enough considering his lower ext edema and weight.  Will give an extra dose tonight. Start xopenex nebs.

## 2011-09-02 ENCOUNTER — Inpatient Hospital Stay (HOSPITAL_COMMUNITY): Payer: Medicare Other

## 2011-09-02 DIAGNOSIS — I059 Rheumatic mitral valve disease, unspecified: Secondary | ICD-10-CM

## 2011-09-02 LAB — GLUCOSE, CAPILLARY: Glucose-Capillary: 96 mg/dL (ref 70–99)

## 2011-09-02 LAB — CBC
HCT: 25.9 % — ABNORMAL LOW (ref 39.0–52.0)
Hemoglobin: 8.6 g/dL — ABNORMAL LOW (ref 13.0–17.0)
MCH: 29.7 pg (ref 26.0–34.0)
MCHC: 33.2 g/dL (ref 30.0–36.0)
MCV: 89.3 fL (ref 78.0–100.0)
Platelets: 366 10*3/uL (ref 150–400)
RBC: 2.9 MIL/uL — ABNORMAL LOW (ref 4.22–5.81)
RDW: 14.8 % (ref 11.5–15.5)
WBC: 14.1 10*3/uL — ABNORMAL HIGH (ref 4.0–10.5)

## 2011-09-02 LAB — URINALYSIS, ROUTINE W REFLEX MICROSCOPIC
Bilirubin Urine: NEGATIVE
Ketones, ur: NEGATIVE mg/dL
Leukocytes, UA: NEGATIVE
Nitrite: NEGATIVE
Protein, ur: NEGATIVE mg/dL
Urobilinogen, UA: 0.2 mg/dL (ref 0.0–1.0)
pH: 5.5 (ref 5.0–8.0)

## 2011-09-02 LAB — BASIC METABOLIC PANEL
BUN: 22 mg/dL (ref 6–23)
CO2: 28 mEq/L (ref 19–32)
Calcium: 8.1 mg/dL — ABNORMAL LOW (ref 8.4–10.5)
Chloride: 99 mEq/L (ref 96–112)
Creatinine, Ser: 0.77 mg/dL (ref 0.50–1.35)
GFR calc Af Amer: >90 mL/min (ref >90)
GFR calc non Af Amer: 84 mL/min — ABNORMAL LOW (ref >90)
Glucose, Bld: 117 mg/dL — ABNORMAL HIGH (ref 70–99)
Potassium: 4.2 mEq/L (ref 3.5–5.1)
Sodium: 135 mEq/L (ref 135–145)

## 2011-09-02 LAB — MAGNESIUM: Magnesium: 2.3 mg/dL (ref 1.5–2.5)

## 2011-09-02 MED ORDER — METOPROLOL TARTRATE 50 MG PO TABS
50.0000 mg | ORAL_TABLET | Freq: Two times a day (BID) | ORAL | Status: DC
  Administered 2011-09-02 – 2011-09-07 (×10): 50 mg via ORAL
  Filled 2011-09-02 (×14): qty 1

## 2011-09-02 MED ORDER — POTASSIUM CHLORIDE CRYS ER 20 MEQ PO TBCR
40.0000 meq | EXTENDED_RELEASE_TABLET | Freq: Once | ORAL | Status: AC
  Administered 2011-09-02: 40 meq via ORAL

## 2011-09-02 MED ORDER — AMIODARONE HCL IN DEXTROSE 360-4.14 MG/200ML-% IV SOLN
0.5000 mg/min | INTRAVENOUS | Status: DC
  Administered 2011-09-04 – 2011-09-06 (×3): 0.5 mg/min via INTRAVENOUS
  Filled 2011-09-02 (×17): qty 200

## 2011-09-02 MED ORDER — FUROSEMIDE 10 MG/ML IJ SOLN
80.0000 mg | Freq: Two times a day (BID) | INTRAMUSCULAR | Status: AC
  Administered 2011-09-02 (×2): 80 mg via INTRAVENOUS
  Filled 2011-09-02 (×2): qty 8

## 2011-09-02 MED ORDER — AMIODARONE HCL IN DEXTROSE 360-4.14 MG/200ML-% IV SOLN
1.0000 mg/min | INTRAVENOUS | Status: AC
  Administered 2011-09-02: 0.501 mg/min via INTRAVENOUS

## 2011-09-02 MED ORDER — METOLAZONE 10 MG PO TABS
10.0000 mg | ORAL_TABLET | Freq: Every day | ORAL | Status: AC
  Administered 2011-09-02: 10 mg via ORAL
  Filled 2011-09-02: qty 1

## 2011-09-02 MED ORDER — FUROSEMIDE 10 MG/ML IJ SOLN: 40.0000 mg | Freq: Two times a day (BID) | INTRAMUSCULAR | Status: DC

## 2011-09-02 MED ORDER — AMIODARONE HCL IN DEXTROSE 360-4.14 MG/200ML-% IV SOLN
INTRAVENOUS | Status: AC
  Administered 2011-09-02: 0.501 mg/min via INTRAVENOUS
  Filled 2011-09-02: qty 200

## 2011-09-02 NOTE — Progress Notes (Signed)
Having some NSVT  this PM No CP, some dyspnea but improving  BP 101/48  Pulse 78  Temp 98.7 F (37.1 C) (Oral)  Resp 22  Ht 5\' 10"  (1.778 m)  Wt 199 lb 8.3 oz (90.5 kg)  BMI 28.63 kg/m2  SpO2 100%   Intake/Output Summary (Last 24 hours) at 09/02/11 1730 Last data filed at 09/02/11 1600  Gross per 24 hour  Intake 863.25 ml  Output   2800 ml  Net -1936.75 ml    Antiarrhythmics per EP

## 2011-09-02 NOTE — Progress Notes (Addendum)
8 Days Post-Op Procedure(s) (LRB): CORONARY ARTERY BYPASS GRAFTING (CABG) (N/A) AORTIC VALVE REPLACEMENT (AVR) (N/A)  Subjective: Patient with shortness of breath this am.  Objective: Vital signs in last 24 hours: Patient Vitals for the past 24 hrs:  BP Temp Temp src Pulse Resp SpO2 Weight  09/02/11 0700 113/64 mmHg - - 80  24  100 % -  09/02/11 0600 113/74 mmHg - - 81  23  99 % 199 lb 8.3 oz (90.5 kg)  09/02/11 0500 113/69 mmHg - - 76  19  100 % -  09/02/11 0400 115/63 mmHg - - 77  19  100 % -  09/02/11 0317 - - - - - 99 % -  09/02/11 0300 100/58 mmHg - - 73  19  100 % -  09/02/11 0200 113/72 mmHg - - 80  25  99 % -  09/02/11 0100 105/60 mmHg - - 72  18  100 % -  09/02/11 0000 104/57 mmHg - - 70  18  100 % -  09/01/11 2300 102/55 mmHg - - 71  21  99 % -  09/01/11 2200 119/101 mmHg - - 81  24  99 % -  09/01/11 2111 - - - - - 98 % -  09/01/11 2100 123/70 mmHg - - 87  25  98 % -  09/01/11 2000 125/66 mmHg - - 84  24  99 % -  09/01/11 1915 - 97.4 F (36.3 C) Oral - - - -  09/01/11 1900 129/75 mmHg - - 86  25  99 % -  09/01/11 1800 120/68 mmHg - - 82  23  97 % -  09/01/11 1700 121/70 mmHg - - 82  24  100 % -  09/01/11 1600 121/70 mmHg - - 77  20  99 % -  09/01/11 1542 - 98.2 F (36.8 C) Oral - - - -  09/01/11 1500 105/61 mmHg - - 78  24  99 % -  09/01/11 1400 - - - 76  20  100 % -  09/01/11 1300 112/66 mmHg - - 81  27  99 % -  09/01/11 1200 118/63 mmHg - - 85  27  99 % -  09/01/11 1125 - 97.5 F (36.4 C) Oral - - - -  09/01/11 1100 114/62 mmHg - - 86  27  100 % -  09/01/11 1000 105/55 mmHg - - 85  29  100 % -  09/01/11 0900 113/63 mmHg - - 87  25  99 % -  09/01/11 0800 116/67 mmHg - - 86  26  100 % -   Pre op weight  81.4kg Current Weight  09/02/11 199 lb 8.3 oz (90.5 kg)      Intake/Output from previous day: 08/08 0701 - 08/09 0700 In: 830.2 [P.O.:320; I.V.:506.2; IV Piggyback:4] Out: 2350 [Urine:2350]   Physical Exam:  Cardiovascular: RRR;rub; no murmurs,  gallops. Pulmonary: Crackles; no rales, wheezes, or rhonchi. Abdomen: Soft, non tender, bowel sounds present. Extremities: 2+ bilateral lower extremity edema. Wounds: Clean and dry.  No erythema or signs of infection. Sternum is stable.  Lab Results: CBC:  Basename 09/02/11 0320 09/01/11 0250  WBC 14.1* 9.8  HGB 8.6* 8.3*  HCT 25.9* 24.3*  PLT 366 285   BMET:   Basename 09/02/11 0320 09/01/11 0250  NA 135 136  K 4.2 4.2  CL 99 102  CO2 28 25  GLUCOSE 117* 123*  BUN 22 21  CREATININE 0.77 0.71  CALCIUM 8.1* 8.0*    PT/INR:  Lab Results  Component Value Date   INR 1.89* 08/25/2011   ABG:  INR: Will add last result for INR, ABG once components are confirmed Will add last 4 CBG results once components are confirmed  Assessment/Plan:  1. CV - Previous afib.SR but does go back into afib with CVR. At around 3 am, he had runs of NSVT then Vfib. ROSC after first defib.PVC, trigeminy. EPS suggest ICD (at some point).On Amiodarone gttp, Amiodarone 400 bid, and Lopressor 25 bid.-per cardiology 2.  Pulmonary - Encourage incentive spirometer.CXR this am shows no pneumothorax, cardiomegaly, pulmonary vascular congestion.Will wean O2 as tolerates. 3.Volume Overloaded-Will diurese with IV Lasix again today. 4.  Acute blood loss anemia - H and H stable this am at 8.6 and 25.9.Continue Nu iron and folic acid. 5.Leukocytosis-WBC this am up to 14,100.He remains afebrile.No signs of wound infection. Check UA.  ZIMMERMAN,DONIELLE MPA-C  09/02/2011 7:29 AM      Feels better, still with volume overload no further v fib since two nights ago On coradrone QTC 479

## 2011-09-02 NOTE — Progress Notes (Signed)
Patient: Gabriel Marsh Date of Encounter: 09/02/2011, 9:56 AM Admit date: 08/23/2011     Subjective  Gabriel Marsh is still SOB. He denies CP or palpitations. He is having frequent PVCs and few runs of NSVT this AM.   Objective  Physical Exam: Vitals: BP 113/64  Pulse 80  Temp 98 F (36.7 C) (Oral)  Resp 24  Ht 5\' 10"  (1.778 m)  Wt 199 lb 8.3 oz (90.5 kg)  BMI 28.63 kg/m2  SpO2 100% General: Well developed, frail appearing 78 year old male in no acute distress. Neck: Supple. JVD not elevated. Lungs: Tachypneic with bibasilar rales. No wheezes or rhonchi.  Heart: Irregular S1 S2 without murmurs, rub or gallop.  Abdomen: Soft, non-distended. Extremities: No clubbing or cyanosis.+ dependant edema.   Neuro: Alert and oriented X 3. No focal deficits.  Intake/Output: Intake/Output Summary (Last 24 hours) at 09/02/11 0956 Last data filed at 09/02/11 0600  Gross per 24 hour  Intake  739.6 ml  Output   2150 ml  Net -1410.4 ml   Inpatient Medications:  . aspirin EC  325 mg Oral Daily  . enoxaparin  30 mg Subcutaneous Q24H  . feeding supplement  237 mL Oral BID BM  . folic acid  1 mg Oral Daily  . furosemide  40 mg Intravenous BID  . furosemide  80 mg Intravenous Once  . furosemide  80 mg Intravenous BID  . insulin aspart  0-24 Units Subcutaneous TID AC & HS  . iron polysaccharides  150 mg Oral Daily  . levalbuterol  0.63 mg Nebulization Q6H  . metolazone  10 mg Oral Daily  . metoprolol tartrate  50 mg Oral BID  . potassium chloride  20 mEq Oral BID  . potassium chloride  40 mEq Oral Once  . potassium chloride  40 mEq Oral Once  . simvastatin  5 mg Oral q1800  . sodium chloride  3 mL Intravenous Q12H  . DISCONTD: amiodarone  400 mg Oral BID  . DISCONTD: furosemide  40 mg Intravenous BID  . DISCONTD: metoprolol tartrate  12.5 mg Oral BID  . DISCONTD: metoprolol tartrate  25 mg Oral BID   . amiodarone (NEXTERONE PREMIX) 360 mg/200 mL dextrose 0.501 mg/min (09/02/11 0930)    Followed by  . amiodarone (NEXTERONE PREMIX) 360 mg/200 mL dextrose    . DISCONTD: amiodarone (NEXTERONE PREMIX) 360 mg/200 mL dextrose 30.6 mg/hr (09/01/11 1920)   Labs:  [facility] 09/02/11 0320 09/01/11 0250  NA 135 136  K 4.2 4.2  CL 99 102  CO2 28 25  GLUCOSE 117* 123*  BUN 22 21  CREATININE 0.77 0.71  CALCIUM 8.1* 8.0*  MG 2.3 2.3  PHOS -- --    Basename 09/02/11 0320 09/01/11 0250  WBC 14.1* 9.8  NEUTROABS -- --  HGB 8.6* 8.3*  HCT 25.9* 24.3*  MCV 89.3 88.0  PLT 366 285    Basename 09/01/11 1925 09/01/11 1205 09/01/11 0250  CKTOTAL 73 78 74  CKMB 5.4* 5.1* 4.6*  TROPONINI 0.93* 0.99* 1.16*   No components found with this basename: POCBNP:3   Radiology/Studies: Dg Chest Port 1 View 09/02/2011   *RADIOLOGY REPORT*  Clinical Data: Status post CABG and aortic valve replacement.  PORTABLE CHEST - 1 VIEW  Comparison: Chest x-ray 08/2011.  Findings: Right IJ central venous catheter with tip terminating in the distal superior vena cava.  Status post median sternotomy for CABG and aortic valve replacement (there appears to be a stented bioprosthesis  and position). There is marked cephalization of the pulmonary vasculature, severe indistinctness of the interstitial markings, and extensive multifocal airspace disease throughout the lungs bilaterally suggestive of severe pulmonary edema.  Bilateral pleural effusions (moderate).  Mild enlargement of the cardiopericardial silhouette is unchanged. The patient is rotated to the left on today's exam, resulting in distortion of the mediastinal contours and reduced diagnostic sensitivity and specificity for mediastinal pathology.  IMPRESSION: 1.  Overall, there appears to be worsening pulmonary edema and increasing bilateral pleural effusions.  The radiographic appearance of the chest is otherwise similar, as above.  Original Report Authenticated By: Florencia Reasons, M.D.   Telemetry: sinus rhythm with frequent PVCs, NSVT 12-lead ECG:  sinus rhythm with first degree AV block, PVCs; QTc 440 ms  Echocardiogram: bedside echo in progress   Assessment and Plan  1. VF/polymorphic VT arrest Gabriel Marsh has had increased ventricular ectopy this AM with recurrent NSVT. Will restart IV amiodarone infusion, no bolus. Will increase his metoprolol to 50 mg BID. Place defibrillator pads. Dr. Johney Frame has discussed possible "relook" cardiac cath to determine if graft closure could be cause for polymorphic VT. Follow closely.    Dr. Johney Frame to see and make further recommendations. Signed, EDMISTEN, BROOKE PA-C  I have seen, examined the patient, and reviewed the above assessment and plan.  Changes to above are made where necessary.  He did not tolerating conversion to PO amiodarone today, with increased ventricular ectopy observed.  He has long short coupled polymorphic VT which was nonsustained.  He is much better now back on IV amiodarone. I would continue IV amiodarone for another 24-48 hours and then try to convert to PO again.  Follow QT closely. IF he has prolonged torsades,then I would advise IV magnesium (even in setting of normal serum magnesium levels).  Continue to uptitrate metoprolol over the weekend.  Dr Ladona Ridgel will be in the hospital this weekend.  I will ask him to see pt for me.  Co Sign: Hillis Range, MD 09/02/2011 4:19 PM

## 2011-09-02 NOTE — Progress Notes (Signed)
  Echocardiogram 2D Echocardiogram has been performed.  Karyl Sharrar FRANCES 09/02/2011, 10:12 AM

## 2011-09-02 NOTE — Progress Notes (Signed)
Subjective:  The patient feels better this morning.  He is less short of breath.  He still has significant peripheral edema and decreased breath sounds at the bases.  His rhythm shows normal sinus rhythm with PVCs but no further ventricular tachycardia.  Chest x-ray today shows what appears to be worsening pulmonary edema and his diuretics have been increased.  Objective:  Vital Signs in the last 24 hours: Temp:  [97.4 F (36.3 C)-98.2 F (36.8 C)] 98 F (36.7 C) (08/09 0737) Pulse Rate:  [70-87] 80  (08/09 0700) Resp:  [18-29] 24  (08/09 0700) BP: (100-129)/(55-101) 113/64 mmHg (08/09 0700) SpO2:  [97 %-100 %] 100 % (08/09 0700) Weight:  [199 lb 8.3 oz (90.5 kg)] 199 lb 8.3 oz (90.5 kg) (08/09 0600)  Intake/Output from previous day: 08/08 0701 - 08/09 0700 In: 830.2 [P.O.:320; I.V.:506.2; IV Piggyback:4] Out: 2350 [Urine:2350] Intake/Output from this shift:       . amiodarone  400 mg Oral BID  . aspirin EC  325 mg Oral Daily  . enoxaparin  30 mg Subcutaneous Q24H  . feeding supplement  237 mL Oral BID BM  . folic acid  1 mg Oral Daily  . furosemide  40 mg Intravenous BID  . furosemide  80 mg Intravenous Once  . furosemide  80 mg Intravenous BID  . insulin aspart  0-24 Units Subcutaneous TID AC & HS  . iron polysaccharides  150 mg Oral Daily  . levalbuterol  0.63 mg Nebulization Q6H  . metolazone  10 mg Oral Daily  . metoprolol tartrate  25 mg Oral BID  . potassium chloride  20 mEq Oral BID  . potassium chloride  40 mEq Oral Once  . potassium chloride  40 mEq Oral Once  . simvastatin  5 mg Oral q1800  . sodium chloride  3 mL Intravenous Q12H  . DISCONTD: furosemide  40 mg Intravenous BID  . DISCONTD: metoprolol tartrate  12.5 mg Oral BID      . DISCONTD: amiodarone (NEXTERONE PREMIX) 360 mg/200 mL dextrose 30.6 mg/hr (09/01/11 1920)    Physical Exam: The patient appears to be in no distress.  Head and neck exam reveals that the pupils are equal and reactive.   The extraocular movements are full.  There is no scleral icterus.  Mouth and pharynx are benign.  No lymphadenopathy.  No carotid bruits.  The jugular venous pressure is normal.  Thyroid is not enlarged or tender.  Chest reveals decreased breath sounds and dullness at both bases  Heart reveals a soft systolic ejection murmur at the base.  No gallop.  The abdomen is soft and nontender.  Bowel sounds are normoactive.  There is no hepatosplenomegaly or mass.  There are no abdominal bruits.  Extremities reveal moderate bilateral pretibial and pedal edema Neurologic exam is normal strength and no lateralizing weakness.  No sensory deficits.  Integument reveals no rash  Lab Results:  Basename 09/02/11 0320 09/01/11 0250  WBC 14.1* 9.8  HGB 8.6* 8.3*  PLT 366 285    Basename 09/02/11 0320 09/01/11 0250  NA 135 136  K 4.2 4.2  CL 99 102  CO2 28 25  GLUCOSE 117* 123*  BUN 22 21  CREATININE 0.77 0.71    Basename 09/01/11 1925 09/01/11 1205  TROPONINI 0.93* 0.99*   Hepatic Function Panel No results found for this basename: PROT,ALBUMIN,AST,ALT,ALKPHOS,BILITOT,BILIDIR,IBILI in the last 72 hours No results found for this basename: CHOL in the last 72 hours No results found  for this basename: PROTIME in the last 72 hours  Imaging: Imaging results have been reviewed  Cardiac Studies:  Assessment/Plan:  Patient Active Hospital Problem List:  CAD (coronary artery disease), native coronary artery (08/23/2011)   Assessment: Dr. Johney Frame has raised the question of possible studying his coronaries to see if one of his grafts as closed down    Plan: Will discuss with Dr. Tyrone Sage       Torsades de pointes (09/01/2011)   Assessment: Dr. Johney Frame has seen the patient for EP evaluation    Plan: The patient will eventually need an ICD.  He may benefit from a life vest for a month at discharge prior to ICD placement  Atrial fibrillation (09/01/2011)   Assessment: He is maintaining normal sinus  rhythm    Plan: Continue beta blocker.  We are switching back to oral amlodipine 400 mg twice a day  Acute on chronic systolic heart failure (09/01/2011)   Assessment: Still has significant fluid overload    Plan: Continue diuresis   LOS: 10 days    Cassell Clement 09/02/2011, 8:48 AM

## 2011-09-02 NOTE — Progress Notes (Signed)
Pt out of bed to chair with moderate assist, minimal increase in WOB.  Geomat placed in chair.  Lungs clear, diminished bases.  IS to 500x5, flutter valve x 10.  Tolerated well.

## 2011-09-02 NOTE — Progress Notes (Signed)
Patient having frequent unifocal PVC's, occasionally in trigeminy.  HR SR 1st degree in 70's, bp 100/58, sO2 100% on 2L [place].  Patient did received 25mg  lopressor at 2200 on 09/01/11. Amiodorone infusing at 30mg /hr.  QTc .  Patient asleep, in no distress.  Will draw am labs (CBC, BMET) and continue to monitor.

## 2011-09-02 NOTE — Progress Notes (Addendum)
Nutrition Follow-up  Intervention:    Continue Ensure Complete twice daily (350 kcals, 13 gm protein per 8 fl oz bottle) RD to follow for nutrition care plan  Assessment:   Noted ventricular fibrillation arrest episode 8/8. IV Lasix for volume overload. PO intake variable at 40-70% per flowsheet records; he is drinking his Ensure Complete supplements.  Diet Order:  Carbohydrate Modified High Calorie  Meds: Scheduled Meds:   . aspirin EC  325 mg Oral Daily  . enoxaparin  30 mg Subcutaneous Q24H  . feeding supplement  237 mL Oral BID BM  . folic acid  1 mg Oral Daily  . furosemide  40 mg Intravenous BID  . furosemide  80 mg Intravenous Once  . furosemide  80 mg Intravenous BID  . insulin aspart  0-24 Units Subcutaneous TID AC & HS  . iron polysaccharides  150 mg Oral Daily  . levalbuterol  0.63 mg Nebulization Q6H  . metolazone  10 mg Oral Daily  . metoprolol tartrate  50 mg Oral BID  . potassium chloride  20 mEq Oral BID  . potassium chloride  40 mEq Oral Once  . potassium chloride  40 mEq Oral Once  . simvastatin  5 mg Oral q1800  . sodium chloride  3 mL Intravenous Q12H  . DISCONTD: amiodarone  400 mg Oral BID  . DISCONTD: furosemide  40 mg Intravenous BID  . DISCONTD: metoprolol tartrate  12.5 mg Oral BID  . DISCONTD: metoprolol tartrate  25 mg Oral BID   Continuous Infusions:   . amiodarone (NEXTERONE PREMIX) 360 mg/200 mL dextrose 0.501 mg/min (09/02/11 1200)   Followed by  . amiodarone (NEXTERONE PREMIX) 360 mg/200 mL dextrose    . DISCONTD: amiodarone (NEXTERONE PREMIX) 360 mg/200 mL dextrose 30.6 mg/hr (09/01/11 1920)   PRN Meds:.sodium chloride, diphenhydrAMINE, guaiFENesin, sodium chloride, traMADol  Labs:  CMP     Component Value Date/Time   NA 135 09/02/2011 0320   K 4.2 09/02/2011 0320   CL 99 09/02/2011 0320   CO2 28 09/02/2011 0320   GLUCOSE 117* 09/02/2011 0320   BUN 22 09/02/2011 0320   CREATININE 0.77 09/02/2011 0320   CALCIUM 8.1* 09/02/2011 0320   PROT 5.2*  08/29/2011 0428   ALBUMIN 2.4* 08/29/2011 0428   AST 71* 08/29/2011 0428   ALT 141* 08/29/2011 0428   ALKPHOS 57 08/29/2011 0428   BILITOT 0.4 08/29/2011 0428   GFRNONAA 84* 09/02/2011 0320   GFRAA >90 09/02/2011 0320     Intake/Output Summary (Last 24 hours) at 09/02/11 1427 Last data filed at 09/02/11 1300  Gross per 24 hour  Intake 1064.25 ml  Output   2150 ml  Net -1085.75 ml    CBG (last 3)   Basename 09/02/11 1146 09/02/11 0736 09/01/11 2155  GLUCAP 154* 100* 110*    Weight Status:  90.5 kg (8/9) -- stable  Re-estimated needs: 2000-2200 kcals, 100-110 gm protein   Nutrition Dx: Inadequate Oral Intake, ongoing  New Goal: Oral intake with meals & supplements to meet >/= 90% of estimated nutrition needs, progressing  Monitor: PO & supplemental intake, weight, labs, I/O's   Kirkland Hun, RD, LDN  Pager #: [phone]  After-Hours Pager #: [phone]

## 2011-09-02 NOTE — Progress Notes (Signed)
Patient transitioned from IV to PO amiodarone around 0830.  Around 0840, patient had 12 beat run of V tach, followed by another 10-12 beat run of V tach at 0900.  Dr. Tyrone Sage paged as well Dr. Johney Frame.  Stat echo ordered, Lopressor dose increased, and IV amiodarone restarted.  Will continue to assess and monitor.  Keitha Butte, RN

## 2011-09-03 ENCOUNTER — Inpatient Hospital Stay (HOSPITAL_COMMUNITY): Payer: Medicare Other

## 2011-09-03 LAB — CBC
HCT: 25.8 % — ABNORMAL LOW (ref 39.0–52.0)
Hemoglobin: 8.5 g/dL — ABNORMAL LOW (ref 13.0–17.0)
RBC: 2.88 MIL/uL — ABNORMAL LOW (ref 4.22–5.81)
WBC: 14.8 10*3/uL — ABNORMAL HIGH (ref 4.0–10.5)

## 2011-09-03 LAB — BASIC METABOLIC PANEL
BUN: 24 mg/dL — ABNORMAL HIGH (ref 6–23)
CO2: 33 mEq/L — ABNORMAL HIGH (ref 19–32)
CO2: 32 mEq/L (ref 19–32)
Calcium: 8.5 mg/dL (ref 8.4–10.5)
Calcium: 8.5 mg/dL (ref 8.4–10.5)
Chloride: 92 mEq/L — ABNORMAL LOW (ref 96–112)
Creatinine, Ser: 0.86 mg/dL (ref 0.50–1.35)
Creatinine, Ser: 0.88 mg/dL (ref 0.50–1.35)
GFR calc Af Amer: >90 mL/min (ref >90)
GFR calc non Af Amer: 80 mL/min — ABNORMAL LOW (ref >90)
GFR calc non Af Amer: 80 mL/min — ABNORMAL LOW (ref >90)
Glucose, Bld: 129 mg/dL — ABNORMAL HIGH (ref 70–99)
Glucose, Bld: 171 mg/dL — ABNORMAL HIGH (ref 70–99)
Potassium: 2.5 mEq/L — CL (ref 3.5–5.1)
Sodium: 134 mEq/L — ABNORMAL LOW (ref 135–145)

## 2011-09-03 LAB — POCT I-STAT 4, (NA,K, GLUC, HGB,HCT): Glucose, Bld: 164 mg/dL — ABNORMAL HIGH (ref 70–99)

## 2011-09-03 LAB — GLUCOSE, CAPILLARY
Glucose-Capillary: 107 mg/dL — ABNORMAL HIGH (ref 70–99)
Glucose-Capillary: 98 mg/dL (ref 70–99)

## 2011-09-03 LAB — MAGNESIUM: Magnesium: 2.4 mg/dL (ref 1.5–2.5)

## 2011-09-03 MED ORDER — POTASSIUM CHLORIDE 10 MEQ/50ML IV SOLN
INTRAVENOUS | Status: AC
  Administered 2011-09-03: 50 meq via INTRAVENOUS
  Filled 2011-09-03: qty 50

## 2011-09-03 MED ORDER — POTASSIUM CHLORIDE 10 MEQ/50ML IV SOLN
10.0000 meq | INTRAVENOUS | Status: AC
  Administered 2011-09-03: 10 meq via INTRAVENOUS
  Administered 2011-09-03: 50 meq via INTRAVENOUS
  Administered 2011-09-03 – 2011-09-05 (×7): 10 meq via INTRAVENOUS
  Filled 2011-09-03: qty 150

## 2011-09-03 MED ORDER — POTASSIUM CHLORIDE CRYS ER 20 MEQ PO TBCR
40.0000 meq | EXTENDED_RELEASE_TABLET | Freq: Two times a day (BID) | ORAL | Status: DC
  Administered 2011-09-03 – 2011-09-06 (×8): 40 meq via ORAL
  Filled 2011-09-03 (×10): qty 2

## 2011-09-03 MED ORDER — METOLAZONE 10 MG PO TABS
10.0000 mg | ORAL_TABLET | Freq: Every day | ORAL | Status: AC
  Administered 2011-09-03: 10 mg via ORAL
  Filled 2011-09-03: qty 1

## 2011-09-03 MED ORDER — POTASSIUM CHLORIDE CRYS ER 20 MEQ PO TBCR
40.0000 meq | EXTENDED_RELEASE_TABLET | Freq: Once | ORAL | Status: AC
  Administered 2011-09-03: 40 meq via ORAL
  Filled 2011-09-03: qty 2

## 2011-09-03 MED ORDER — POTASSIUM CHLORIDE 10 MEQ/50ML IV SOLN
INTRAVENOUS | Status: AC
  Administered 2011-09-03: 10 meq
  Filled 2011-09-03: qty 50

## 2011-09-03 NOTE — Progress Notes (Signed)
Patient ID: Gabriel Marsh, male   DOB: [birth year], 78 y.o.   MRN: [id number] Subjective:  No chest pain or sob.   Objective:  Vital Signs in the last 24 hours: Temp:  [97.4 F (36.3 C)-98.7 F (37.1 C)] 97.5 F (36.4 C) (08/10 1058) Pulse Rate:  [59-106] 72  (08/10 0700) Resp:  [20-28] 22  (08/10 0700) BP: (92-123)/(48-68) 108/52 mmHg (08/10 0700) SpO2:  [96 %-100 %] 99 % (08/10 0739) Weight:  [192 lb 14.4 oz (87.5 kg)] 192 lb 14.4 oz (87.5 kg) (08/10 0630)  Intake/Output from previous day: 08/09 0701 - 08/10 0700 In: 1380.9 [P.O.:740; I.V.:624.9; IV Piggyback:16] Out: 4376 [Urine:4375; Stool:1] Intake/Output from this shift:    Physical Exam: elderly appearing NAD HEENT: Unremarkable Neck:  No JVD, no thyromegally Lungs:  Clear except for rare scattered rales. HEART:  Regular rate rhythm, no murmurs, no rubs, no clicks Abd:  Flat, positive bowel sounds, no organomegally, no rebound, no guarding Ext:  2 plus pulses, no edema, no cyanosis, no clubbing Skin:  No rashes no nodules Neuro:  CN II through XII intact, motor grossly intact  Lab Results:  Basename 09/03/11 0405 09/02/11 0320  WBC 14.8* 14.1*  HGB 8.5* 8.6*  PLT 406* 366    Basename 09/03/11 0405 09/02/11 0320  NA 134* 135  K 2.5* 4.2  CL 92* 99  CO2 33* 28  GLUCOSE 129* 117*  BUN 24* 22  CREATININE 0.86 0.77    Basename 09/01/11 1925 09/01/11 1205  TROPONINI 0.93* 0.99*   Hepatic Function Panel No results found for this basename: PROT,ALBUMIN,AST,ALT,ALKPHOS,BILITOT,BILIDIR,IBILI in the last 72 hours No results found for this basename: CHOL in the last 72 hours No results found for this basename: PROTIME in the last 72 hours  Imaging: Dg Chest Port 1 View  09/03/2011  *RADIOLOGY REPORT*  Clinical Data: Evaluate chest tube.  PORTABLE CHEST - 1 VIEW  Comparison: Chest x-ray 09/02/2011.  Findings: There is a right-sided internal jugular central venous catheter with tip terminating in the distal  superior vena cava. There is cephalization of the pulmonary vasculature, indistinctness of the interstitial markings, and patchy airspace disease throughout the lungs bilaterally suggestive of moderate pulmonary edema.  Small bilateral pleural effusions (right greater than left).  Bibasilar opacities compatible with atelectasis.  Mild enlargement of the cardiopericardial silhouette is unchanged. The patient is rotated to the left on today's exam, resulting in distortion of the mediastinal contours and reduced diagnostic sensitivity and specificity for mediastinal pathology. Status post median sternotomy for CABG and aortic valve replacement (a stented bioprosthesis is noted).  IMPRESSION:  1.  Appearance of the chest suggests congestive heart failure. However, the patchy interstitial and airspace disease is slightly asymmetric involving the left upper lobe to a greater extent than the right.  Clinical correlation for signs and symptoms of superimposed infection is recommended. 2.  Small bilateral pleural effusions with bibasilar dependent atelectasis is also noted. 3.  Mild enlargement of the cardiopericardial silhouette is unchanged.  Original Report Authenticated By: Florencia Reasons, M.D.   Dg Chest Port 1 View  09/02/2011  *RADIOLOGY REPORT*  Clinical Data: Status post CABG and aortic valve replacement.  PORTABLE CHEST - 1 VIEW  Comparison: Chest x-ray 08/2011.  Findings: Right IJ central venous catheter with tip terminating in the distal superior vena cava.  Status post median sternotomy for CABG and aortic valve replacement (there appears to be a stented bioprosthesis and position). There is marked cephalization of the pulmonary vasculature, severe indistinctness  of the interstitial markings, and extensive multifocal airspace disease throughout the lungs bilaterally suggestive of severe pulmonary edema.  Bilateral pleural effusions (moderate).  Mild enlargement of the cardiopericardial silhouette is  unchanged. The patient is rotated to the left on today's exam, resulting in distortion of the mediastinal contours and reduced diagnostic sensitivity and specificity for mediastinal pathology.  IMPRESSION: 1.  Overall, there appears to be worsening pulmonary edema and increasing bilateral pleural effusions.  The radiographic appearance of the chest is otherwise similar, as above.  Original Report Authenticated By: Florencia Reasons, M.D.    Cardiac Studies: Tele - NSR and PVC's. Assessment/Plan:  1. S/p AVR/CABG 2. VF arrest  3. PVC's 4. Hypokalemia Rec: will continue IV amio, repleate potassium, and plan ICD implant next week.  LOS: 11 days    Buel Ream.D. 09/03/2011, 11:00 AM

## 2011-09-03 NOTE — Progress Notes (Signed)
9 Days Post-Op Procedure(s) (LRB): CORONARY ARTERY BYPASS GRAFTING (CABG) (N/A) AORTIC VALVE REPLACEMENT (AVR) (N/A) Subjective: Feels better  Objective: Vital signs in last 24 hours: Temp:  [97.4 F (36.3 C)-98.7 F (37.1 C)] 97.5 F (36.4 C) (08/10 0710) Pulse Rate:  [59-106] 72  (08/10 0700) Cardiac Rhythm:  [-] Normal sinus rhythm (08/10 0200) Resp:  [20-31] 22  (08/10 0700) BP: (92-123)/(48-74) 108/52 mmHg (08/10 0700) SpO2:  [96 %-100 %] 99 % (08/10 0739) Weight:  [192 lb 14.4 oz (87.5 kg)] 192 lb 14.4 oz (87.5 kg) (08/10 0630)  Hemodynamic parameters for last 24 hours:    Intake/Output from previous day: 08/09 0701 - 08/10 0700 In: 1380.9 [P.O.:740; I.V.:624.9; IV Piggyback:16] Out: 4376 [Urine:4375; Stool:1] Intake/Output this shift:    General appearance: alert and no distress Heart: regular rate and rhythm Lungs: rales bibasilar Extremities: edema 3+  Lab Results:  Basename 09/03/11 0405 09/02/11 0320  WBC 14.8* 14.1*  HGB 8.5* 8.6*  HCT 25.8* 25.9*  PLT 406* 366   BMET:  Basename 09/03/11 0405 09/02/11 0320  NA 134* 135  K 2.5* 4.2  CL 92* 99  CO2 33* 28  GLUCOSE 129* 117*  BUN 24* 22  CREATININE 0.86 0.77  CALCIUM 8.5 8.1*    PT/INR: No results found for this basename: LABPROT,INR in the last 72 hours ABG    Component Value Date/Time   PHART 7.397 09/01/2011 0323   HCO3 22.9 09/01/2011 0323   TCO2 24 09/01/2011 0323   ACIDBASEDEF 2.0 09/01/2011 0323   O2SAT 100.0 09/01/2011 0323   CBG (last 3)   Basename 09/03/11 0708 09/02/11 2122 09/02/11 1834  GLUCAP 95 96 128*    Assessment/Plan: S/P Procedure(s) (LRB): CORONARY ARTERY BYPASS GRAFTING (CABG) (N/A) AORTIC VALVE REPLACEMENT (AVR) (N/A) - CV-   CHF, BNP still markedly elevated, continue diuresis  VT/ VF- on IV amiodarone, plan per EPS RESP- pulmonary edema- continue diuresis Hypokalemia- supplement    LOS: 11 days    Beyza Bellino C 09/03/2011

## 2011-09-04 ENCOUNTER — Inpatient Hospital Stay (HOSPITAL_COMMUNITY): Payer: Medicare Other

## 2011-09-04 LAB — GLUCOSE, CAPILLARY: Glucose-Capillary: 110 mg/dL — ABNORMAL HIGH (ref 70–99)

## 2011-09-04 LAB — BASIC METABOLIC PANEL
BUN: 26 mg/dL — ABNORMAL HIGH (ref 6–23)
Calcium: 8.6 mg/dL (ref 8.4–10.5)
Creatinine, Ser: 0.82 mg/dL (ref 0.50–1.35)
GFR calc non Af Amer: 82 mL/min — ABNORMAL LOW (ref >90)
Glucose, Bld: 151 mg/dL — ABNORMAL HIGH (ref 70–99)

## 2011-09-04 LAB — CBC
HCT: 26.2 % — ABNORMAL LOW (ref 39.0–52.0)
Hemoglobin: 8.7 g/dL — ABNORMAL LOW (ref 13.0–17.0)
MCH: 29.9 pg (ref 26.0–34.0)
MCHC: 33.2 g/dL (ref 30.0–36.0)
MCV: 90 fL (ref 78.0–100.0)

## 2011-09-04 MED ORDER — POTASSIUM CHLORIDE 10 MEQ/50ML IV SOLN
INTRAVENOUS | Status: AC
  Administered 2011-09-04: 10 meq via INTRAVENOUS
  Filled 2011-09-04: qty 50

## 2011-09-04 MED ORDER — FUROSEMIDE 10 MG/ML IJ SOLN
40.0000 mg | Freq: Two times a day (BID) | INTRAMUSCULAR | Status: DC
  Administered 2011-09-04 – 2011-09-06 (×6): 40 mg via INTRAVENOUS
  Filled 2011-09-04 (×6): qty 4

## 2011-09-04 NOTE — Progress Notes (Signed)
Patient ID: Gabriel Marsh, male   DOB: 07/13/1930, 78 y.o.   MRN: [id number] Subjective:  No chest pain or sob.   Objective:  Vital Signs in the last 24 hours: Temp:  [97.4 F (36.3 C)-97.9 F (36.6 C)] 97.9 F (36.6 C) (08/11 0709) Pulse Rate:  [65-80] 73  (08/11 1000) Resp:  [18-28] 27  (08/11 1000) BP: (99-138)/(50-86) 138/71 mmHg (08/11 1000) SpO2:  [92 %-100 %] 99 % (08/11 1000) FiO2 (%):  [98 %] 98 % (08/11 0951) Weight:  [193 lb 9 oz (87.8 kg)] 193 lb 9 oz (87.8 kg) (08/11 0600)  Intake/Output from previous day: 08/10 0701 - 08/11 0700 In: 1674.1 [P.O.:780; I.V.:844.1; IV Piggyback:50] Out: 1450 [Urine:1450] Intake/Output from this shift: Total I/O In: 234.1 [P.O.:120; I.V.:110.1; IV Piggyback:4] Out: 250 [Urine:250]  Physical Exam: elderly appearing NAD HEENT: Unremarkable Neck:  No JVD, no thyromegally Lungs:  Clear except for rare scattered rales. HEART:  Regular rate rhythm, no murmurs, no rubs, no clicks Abd:  soft, positive bowel sounds, no organomegally, no rebound, no guarding Ext:  2 plus pulses, no edema, no cyanosis, no clubbing Skin:  No rashes no nodules Neuro:  CN II through XII intact, motor grossly intact  Lab Results:  Basename 09/04/11 0400 09/03/11 1839 09/03/11 0405  WBC 15.0* -- 14.8*  HGB 8.7* 9.5* --  PLT 432* -- 406*    Basename 09/04/11 0400 09/03/11 1843  NA 135 132*  K 3.5 3.7  CL 95* 93*  CO2 32 32  GLUCOSE 151* 171*  BUN 26* 27*  CREATININE 0.82 0.88    Basename 09/01/11 1925 09/01/11 1205  TROPONINI 0.93* 0.99*   Hepatic Function Panel No results found for this basename: PROT,ALBUMIN,AST,ALT,ALKPHOS,BILITOT,BILIDIR,IBILI in the last 72 hours No results found for this basename: CHOL in the last 72 hours No results found for this basename: PROTIME in the last 72 hours  Imaging: Dg Chest Port 1 View  09/04/2011  *RADIOLOGY REPORT*  Clinical Data: Pulmonary edema  PORTABLE CHEST - 1 VIEW  Comparison: 09/03/2011;  09/02/2011; 09/01/2011  Findings: Grossly unchanged large cardiac silhouette and mediastinal contours post median sternotomy and CABG.  Stable position of support apparatus.  The pulmonary vasculature remains indistinct with cephalization of flow.  Grossly unchanged small bilateral effusions and bibasilar heterogeneous opacity.  No pneumothorax.  Unchanged bones.  IMPRESSION: Grossly unchanged findings of pulmonary edema with small bilateral effusions and bibasilar opacities, left greater than right, atelectasis versus infiltrate.  Original Report Authenticated By: Waynard Reeds, M.D.   Dg Chest Port 1 View  09/03/2011  *RADIOLOGY REPORT*  Clinical Data: Evaluate chest tube.  PORTABLE CHEST - 1 VIEW  Comparison: Chest x-ray 09/02/2011.  Findings: There is a right-sided internal jugular central venous catheter with tip terminating in the distal superior vena cava. There is cephalization of the pulmonary vasculature, indistinctness of the interstitial markings, and patchy airspace disease throughout the lungs bilaterally suggestive of moderate pulmonary edema.  Small bilateral pleural effusions (right greater than left).  Bibasilar opacities compatible with atelectasis.  Mild enlargement of the cardiopericardial silhouette is unchanged. The patient is rotated to the left on today's exam, resulting in distortion of the mediastinal contours and reduced diagnostic sensitivity and specificity for mediastinal pathology. Status post median sternotomy for CABG and aortic valve replacement (a stented bioprosthesis is noted).  IMPRESSION:  1.  Appearance of the chest suggests congestive heart failure. However, the patchy interstitial and airspace disease is slightly asymmetric involving the left upper lobe to  a greater extent than the right.  Clinical correlation for signs and symptoms of superimposed infection is recommended. 2.  Small bilateral pleural effusions with bibasilar dependent atelectasis is also noted. 3.   Mild enlargement of the cardiopericardial silhouette is unchanged.  Original Report Authenticated By: Florencia Reasons, M.D.    Cardiac Studies: Tele - NSR and PVC's. Assessment/Plan:  1. S/p AVR/CABG 2. VF arrest  3. PVC's 4. Hypokalemia Rec: will continue IV amio with plans to stop tomorrow, and plan ICD implant next week.  LOS: 12 days    Buel Ream.D. 09/04/2011, 10:47 AM

## 2011-09-04 NOTE — Progress Notes (Signed)
Stable day  Ambulated twice  No VT/VF  For ICD later this week

## 2011-09-04 NOTE — Progress Notes (Signed)
10 Days Post-Op Procedure(s) (LRB): CORONARY ARTERY BYPASS GRAFTING (CABG) (N/A) AORTIC VALVE REPLACEMENT (AVR) (N/A) Subjective: Still some shortness of breath Mild chest discomfort  Objective: Vital signs in last 24 hours: Temp:  [97.4 F (36.3 C)-97.9 F (36.6 C)] 97.9 F (36.6 C) (08/11 0709) Pulse Rate:  [65-80] 76  (08/11 0900) Cardiac Rhythm:  [-] Heart block (08/11 0800) Resp:  [18-28] 25  (08/11 0900) BP: (99-126)/(50-86) 126/71 mmHg (08/11 0900) SpO2:  [92 %-100 %] 99 % (08/11 0900) Weight:  [193 lb 9 oz (87.8 kg)] 193 lb 9 oz (87.8 kg) (08/11 0600)  Hemodynamic parameters for last 24 hours:    Intake/Output from previous day: 08/10 0701 - 08/11 0700 In: 1674.1 [P.O.:780; I.V.:844.1; IV Piggyback:50] Out: 1450 [Urine:1450] Intake/Output this shift: Total I/O In: 73.4 [I.V.:73.4] Out: 250 [Urine:250]  General appearance: alert and no distress Neurologic: intact Heart: regular rate and rhythm Lungs: rales bibasilar Extremities: edema 3+ Wound: clean and dry  Lab Results:  Basename 09/04/11 0400 09/03/11 1839 09/03/11 0405  WBC 15.0* -- 14.8*  HGB 8.7* 9.5* --  HCT 26.2* 28.0* --  PLT 432* -- 406*   BMET:  Basename 09/04/11 0400 09/03/11 1843  NA 135 132*  K 3.5 3.7  CL 95* 93*  CO2 32 32  GLUCOSE 151* 171*  BUN 26* 27*  CREATININE 0.82 0.88  CALCIUM 8.6 8.5    PT/INR: No results found for this basename: LABPROT,INR in the last 72 hours ABG    Component Value Date/Time   PHART 7.397 09/01/2011 0323   HCO3 22.9 09/01/2011 0323   TCO2 24 09/01/2011 0323   ACIDBASEDEF 2.0 09/01/2011 0323   O2SAT 100.0 09/01/2011 0323   CBG (last 3)   Basename 09/04/11 0707 09/03/11 2130 09/03/11 1056  GLUCAP 106* 98 105*    Assessment/Plan: S/P Procedure(s) (LRB): CORONARY ARTERY BYPASS GRAFTING (CABG) (N/A) AORTIC VALVE REPLACEMENT (AVR) (N/A) - CV- no further VF, for ICD this week, continue IV amio RESP- still pulmonary edema on CXR- diurese RENAL lytes,  creatinine OK CBG well controlled Mobilize   LOS: 12 days    Gabriel Marsh C 09/04/2011

## 2011-09-04 NOTE — Progress Notes (Signed)
Pharmacist Heart Failure Core Measure Documentation  Assessment: Gabriel Marsh has an EF documented as 25 - 30% on 09/02/11 by 2D Echo.  Rationale: Heart failure patients with left ventricular systolic dysfunction (LVSD) and an EF < 40% should be prescribed an angiotensin converting enzyme inhibitor (ACEI) or angiotensin receptor blocker (ARB) at discharge unless a contraindication is documented in the medical record.  This patient is not currently on an ACEI or ARB for HF.  This note is being placed in the record in order to provide documentation that a contraindication to the use of these agents is present for this encounter.  ACE Inhibitor or Angiotensin Receptor Blocker is contraindicated (specify all that apply)  [x]   ACEI allergy AND ARB allergy (documented allergic to lisinopril - cough) []   Angioedema [x]   Moderate or severe aortic stenosis []   Hyperkalemia []   Hypotension []   Renal artery stenosis []   Worsening renal function, preexisting renal disease or dysfunction   Riki Rusk 09/04/2011 3:33 PM

## 2011-09-05 ENCOUNTER — Inpatient Hospital Stay (HOSPITAL_COMMUNITY): Payer: Medicare Other

## 2011-09-05 LAB — GLUCOSE, CAPILLARY
Glucose-Capillary: 92 mg/dL (ref 70–99)
Glucose-Capillary: 118 mg/dL — ABNORMAL HIGH (ref 70–99)

## 2011-09-05 LAB — CBC
HCT: 26.9 % — ABNORMAL LOW (ref 39.0–52.0)
Hemoglobin: 8.7 g/dL — ABNORMAL LOW (ref 13.0–17.0)
RBC: 2.97 MIL/uL — ABNORMAL LOW (ref 4.22–5.81)
RDW: 15.2 % (ref 11.5–15.5)
WBC: 14.2 10*3/uL — ABNORMAL HIGH (ref 4.0–10.5)

## 2011-09-05 LAB — BASIC METABOLIC PANEL
BUN: 24 mg/dL — ABNORMAL HIGH (ref 6–23)
Chloride: 92 mEq/L — ABNORMAL LOW (ref 96–112)
GFR calc Af Amer: >90 mL/min (ref >90)
Glucose, Bld: 107 mg/dL — ABNORMAL HIGH (ref 70–99)
Potassium: 3.1 mEq/L — ABNORMAL LOW (ref 3.5–5.1)

## 2011-09-05 MED ORDER — SODIUM CHLORIDE 0.9 % IJ SOLN
10.0000 mL | Freq: Two times a day (BID) | INTRAMUSCULAR | Status: DC
  Administered 2011-09-05 – 2011-09-06 (×2): 20 mL

## 2011-09-05 MED ORDER — SODIUM CHLORIDE 0.9 % IJ SOLN: 10.0000 mL | INTRAMUSCULAR | Status: DC | PRN

## 2011-09-05 MED ORDER — POTASSIUM CHLORIDE 10 MEQ/50ML IV SOLN
INTRAVENOUS | Status: AC
  Filled 2011-09-05: qty 50

## 2011-09-05 MED ORDER — LEVALBUTEROL HCL 0.63 MG/3ML IN NEBU
0.6300 mg | INHALATION_SOLUTION | RESPIRATORY_TRACT | Status: DC | PRN
  Filled 2011-09-05: qty 3

## 2011-09-05 MED ORDER — POTASSIUM CHLORIDE CRYS ER 20 MEQ PO TBCR
40.0000 meq | EXTENDED_RELEASE_TABLET | Freq: Once | ORAL | Status: AC
  Administered 2011-09-05: 40 meq via ORAL
  Filled 2011-09-05: qty 2

## 2011-09-05 MED ORDER — SPIRONOLACTONE 25 MG PO TABS
25.0000 mg | ORAL_TABLET | Freq: Every day | ORAL | Status: DC
  Administered 2011-09-05 – 2011-09-08 (×4): 25 mg via ORAL
  Filled 2011-09-05 (×4): qty 1

## 2011-09-05 MED ORDER — POTASSIUM CHLORIDE CRYS ER 20 MEQ PO TBCR: 20.0000 meq | EXTENDED_RELEASE_TABLET | Freq: Once | ORAL | Status: DC

## 2011-09-05 MED ORDER — POTASSIUM CHLORIDE 10 MEQ/50ML IV SOLN
INTRAVENOUS | Status: AC
  Administered 2011-09-05: 10 meq via INTRAVENOUS
  Filled 2011-09-05: qty 50

## 2011-09-05 MED ORDER — LEVALBUTEROL HCL 0.63 MG/3ML IN NEBU
0.6300 mg | INHALATION_SOLUTION | Freq: Three times a day (TID) | RESPIRATORY_TRACT | Status: DC
  Administered 2011-09-05: 0.63 mg via RESPIRATORY_TRACT
  Filled 2011-09-05 (×2): qty 3

## 2011-09-05 MED ORDER — CHLORHEXIDINE GLUCONATE 4 % EX LIQD
60.0000 mL | Freq: Once | CUTANEOUS | Status: AC
  Administered 2011-09-05: 4 via TOPICAL
  Filled 2011-09-05: qty 60

## 2011-09-05 MED ORDER — SODIUM CHLORIDE 0.45 % IV SOLN
INTRAVENOUS | Status: DC
  Administered 2011-09-06: 20 mL/h via INTRAVENOUS

## 2011-09-05 MED ORDER — LEVALBUTEROL HCL 0.63 MG/3ML IN NEBU
0.6300 mg | INHALATION_SOLUTION | Freq: Three times a day (TID) | RESPIRATORY_TRACT | Status: DC
  Administered 2011-09-05 – 2011-09-07 (×5): 0.63 mg via RESPIRATORY_TRACT
  Filled 2011-09-05 (×8): qty 3

## 2011-09-05 MED ORDER — SODIUM CHLORIDE 0.9 % IR SOLN
80.0000 mg | Status: DC
  Administered 2011-09-06: 80 mg
  Filled 2011-09-05: qty 2

## 2011-09-05 MED ORDER — VANCOMYCIN HCL IN DEXTROSE 1-5 GM/200ML-% IV SOLN
1000.0000 mg | INTRAVENOUS | Status: DC
  Administered 2011-09-06: 1000 mg via INTRAVENOUS
  Filled 2011-09-05: qty 200

## 2011-09-05 MED ORDER — CHLORHEXIDINE GLUCONATE 4 % EX LIQD
60.0000 mL | Freq: Once | CUTANEOUS | Status: DC
  Filled 2011-09-05: qty 60

## 2011-09-05 MED FILL — Medication: Qty: 1 | Status: AC

## 2011-09-05 NOTE — Progress Notes (Signed)
Peripherally Inserted Central Catheter/Midline Placement  The IV Nurse has discussed with the patient and/or persons authorized to consent for the patient, the purpose of this procedure and the potential benefits and risks involved with this procedure.  The benefits include less needle sticks, lab draws from the catheter and patient may be discharged home with the catheter.  Risks include, but not limited to, infection, bleeding, blood clot (thrombus formation), and puncture of an artery; nerve damage and irregular heat beat.  Alternatives to this procedure were also discussed.  PICC/Midline Placement Documentation        Stacie Glaze Horton 09/05/2011, 10:53 AM

## 2011-09-05 NOTE — Progress Notes (Signed)
Physical Therapy Treatment Patient Details Name: Gabriel Marsh MRN: [id number] DOB: 1930-05-08 Today's Date: 09/05/2011 Time: 4098-1191 PT Time Calculation (min): 28 min  PT Assessment / Plan / Recommendation Comments on Treatment Session  Continued need to reinforce sternal precautions.  The patient definitely needs O2 for gait.      Follow Up Recommendations  Home health PT    Barriers to Discharge        Equipment Recommendations  Tub/shower seat;Other (comment) (with back and handles, and lift chair per niece)    Recommendations for Other Services OT consult  Frequency Min 3X/week   Plan Discharge plan remains appropriate    Precautions / Restrictions Precautions Precautions: Sternal;Fall Precaution Comments: NEEDS O2 for gait   Pertinent Vitals/Pain No reports of pain, O2 sats with gait on RA dropped to 82% with increased DOE to 3/4.      Mobility  Bed Mobility Bed Mobility: Rolling Left Rolling Left: 4: Min assist Left Sidelying to Sit: 4: Min assist Sitting - Scoot to Edge of Bed: 5: Supervision Transfers Sit to Stand: 4: Min assist;From bed Stand to Sit: 4: Min assist;To chair/3-in-1 Stand Pivot Transfers: 4: Min assist Details for Transfer Assistance: verbal cues for sternal precautions, assit to boost trunk up with minimal use of arms.  Ambulation/Gait Ambulation/Gait Assistance: 4: Min assist Ambulation Distance (Feet): 75 Feet Assistive device: Other (Comment) (WC) Ambulation/Gait Assistance Details: unsteady gait when he lets go of WC in room, posterior stagger LOB.   Gait Pattern: Step-through pattern;Shuffle Gait velocity: less than 1.8 ft/sec putting him at risk for recurrent falls General Gait Details: DOE increased to 3/4 and O2 sats dropped to 82 on RA.      Exercises     PT Diagnosis:    PT Problem List:   PT Treatment Interventions:     PT Goals Acute Rehab PT Goals PT Goal: Supine/Side to Sit - Progress: Progressing toward goal PT Goal:  Sit to Stand - Progress: Progressing toward goal PT Goal: Stand to Sit - Progress: Progressing toward goal PT Goal: Ambulate - Progress: Progressing toward goal  Visit Information  Last PT Received On: 09/05/11 Assistance Needed: +1    Subjective Data  Subjective: Pt reports halfway through the walk: wears O2 at night at home PTA, but has been walking since surgery with O2.     Cognition  Overall Cognitive Status: Appears within functional limits for tasks assessed/performed Cognition - Other Comments: HOH, deaf in left ear    Balance     End of Session PT - End of Session Activity Tolerance: Patient limited by fatigue (limited by cardiopulmonary status) Patient left: in chair;with call bell/phone within reach;with family/visitor present   GP     Lurena Joiner B. Vonetta Foulk, PT, DPT [phone]   09/05/2011, 4:52 PM

## 2011-09-05 NOTE — Progress Notes (Signed)
Per chest Xray, PICC retracted 2cm so tip now rests in SVC. Site U. Dressing and stat lock changed

## 2011-09-05 NOTE — Progress Notes (Addendum)
11 Days Post-Op Procedure(s) (LRB): CORONARY ARTERY BYPASS GRAFTING (CABG) (N/A) AORTIC VALVE REPLACEMENT (AVR) (N/A)  Subjective: Patient with less shortness of breath. His feet "hurt" this am.  Objective: Vital signs in last 24 hours: Patient Vitals for the past 24 hrs:  BP Temp Temp src Pulse Resp SpO2 Weight  09/05/11 0700 - - - 66  20  89 % -  09/05/11 0630 - - - - 23  - 187 lb 13.3 oz (85.2 kg)  09/05/11 0600 110/76 mmHg - - 69  17  93 % -  09/05/11 0530 - - - 60  22  91 % -  09/05/11 0500 107/49 mmHg - - 48  18  92 % -  09/05/11 0430 - - - 70  23  98 % -  09/05/11 0400 116/63 mmHg 97.3 F (36.3 C) - 70  26  99 % -  09/05/11 0330 - - - 68  24  99 % -  09/05/11 0300 114/60 mmHg - - 71  23  99 % -  09/05/11 0230 - - - 66  18  97 % -  09/05/11 0200 106/63 mmHg - - 68  21  96 % -  09/05/11 0135 - - - - - 93 % -  09/05/11 0130 - - - 67  19  92 % -  09/05/11 0100 112/58 mmHg - - 68  23  95 % -  09/05/11 0030 - - - 68  18  96 % -  09/05/11 0000 108/52 mmHg - - 54  19  95 % -  09/04/11 2338 - 98.2 F (36.8 C) - - - - -  09/04/11 2330 - - - 71  21  95 % -  09/04/11 2300 103/66 mmHg - - 69  19  94 % -  09/04/11 2230 - - - 73  25  93 % -  09/04/11 2200 132/58 mmHg - - 130  21  99 % -  09/04/11 2130 - - - 79  23  96 % -  09/04/11 2100 111/55 mmHg - - 76  19  92 % -  09/04/11 2030 - - - 79  21  91 % -  09/04/11 2000 107/64 mmHg 98.6 F (37 C) - 79  21  97 % -  09/04/11 1939 - - - - - 93 % -  09/04/11 1930 - - - 70  25  92 % -  09/04/11 1900 110/61 mmHg - - 79  21  95 % -  09/04/11 1800 138/60 mmHg - - 75  24  95 % -  09/04/11 1700 108/32 mmHg - - 79  20  99 % -  09/04/11 1600 101/62 mmHg - - 73  23  94 % -  09/04/11 1533 - 97.6 F (36.4 C) Oral - - - -  09/04/11 1505 - - - - - 94 % -  09/04/11 1500 97/61 mmHg - - 70  18  93 % -  09/04/11 1400 111/61 mmHg - - 68  16  94 % -  09/04/11 1300 100/59 mmHg - - 68  19  98 % -  09/04/11 1200 114/52 mmHg - - 72  23  99 % -    09/04/11 1108 - 97.8 F (36.6 C) Oral - - - -  09/04/11 1100 116/68 mmHg - - 75  21  97 % -  09/04/11 1000 138/71 mmHg - - 73  27  99 % -  09/04/11 0900 126/71 mmHg - - 76  25  99 % -  09/04/11 0800 114/68 mmHg - - 76  23  93 % -  09/04/11 0709 - 97.9 F (36.6 C) Oral - - - -   Pre op weight  81.4kg Current Weight  09/05/11 187 lb 13.3 oz (85.2 kg)      Intake/Output from previous day: 08/11 0701 - 08/12 0700 In: 1043.8 [P.O.:240; I.V.:695.8; IV Piggyback:108] Out: 1610 [RUEAV:4098; Stool:1]   Physical Exam:  Cardiovascular: RRR;rub; no murmurs, gallops. Pulmonary: Diminished at the bases; no rales, wheezes, or rhonchi. Abdomen: Soft, non tender, bowel sounds present. Extremities: 3+ bilateral lower extremity edema.Mid feet and toes with erythema. Wounds: Clean and dry.  No erythema or signs of infection. Sternum is stable.  Lab Results: CBC:  Basename 09/05/11 0419 09/04/11 0400  WBC 14.2* 15.0*  HGB 8.7* 8.7*  HCT 26.9* 26.2*  PLT 432* 432*   BMET:   Basename 09/05/11 0419 09/04/11 0400  NA 134* 135  K 3.1* 3.5  CL 92* 95*  CO2 36* 32  GLUCOSE 107* 151*  BUN 24* 26*  CREATININE 0.87 0.82  CALCIUM 8.7 8.6    PT/INR:  Lab Results  Component Value Date   INR 1.89* 08/25/2011   ABG:  INR: Will add last result for INR, ABG once components are confirmed Will add last 4 CBG results once components are confirmed  Assessment/Plan:  1. CV - Previous afib and runs of NSVT then Vfib 8/9. ROSC after first defib. Maintaining SR this am.ICD this week-timing per EPS.On Amiodarone gttp, and Lopressor 50 bid.-per cardiology 2.  Pulmonary - Encourage incentive spirometer. 3.Volume Overloaded-On Lasix 40 IV bid.Has diuresed well over the last couple of days. 4.  Acute blood loss anemia - H and H stable this am at 8.7 and 26.9.Continue Nu iron and folic acid. 5.Leukocytosis-WBC this am14,200.He remains afebrile.No signs of wound infection and UA negative. 6.Supplement  potassium  ZIMMERMAN,DONIELLE MPA-C  09/05/2011 7:07 AM  Replace k for hypokalemia Add aldactone for continued heart failure acute on chronic systolic and diastolic heart failure AICD tomorrow Place PIC line and get central line out Cr/bun stable I have seen and examined [name] and agree with the above assessment  and plan.  Delight Ovens MD Beeper [phone] Office [phone] 09/05/2011 8:24 AM

## 2011-09-05 NOTE — Progress Notes (Signed)
Patient ID: Dorris Vangorder, male   DOB: 02-13-1930, 78 y.o.   MRN: [id number]                   [address]            [place] [address]          [phone]     11 Days Post-Op Procedure(s) (LRB): CORONARY ARTERY BYPASS GRAFTING (CABG) (N/A) AORTIC VALVE REPLACEMENT (AVR) (N/A)  Total Length of Stay:  LOS: 13 days  BP 109/61  Pulse 77  Temp 98 F (36.7 C) (Oral)  Resp 22  Ht 5\' 10"  (1.778 m)  Wt 187 lb 13.3 oz (85.2 kg)  BMI 26.95 kg/m2  SpO2 97%     . sodium chloride    . amiodarone (NEXTERONE PREMIX) 360 mg/200 mL dextrose 0.5 mg/min (09/05/11 0800)     Lab Results  Component Value Date   WBC 14.2* 09/05/2011   HGB 8.7* 09/05/2011   HCT 26.9* 09/05/2011   PLT 432* 09/05/2011   GLUCOSE 107* 09/05/2011   CHOL 116 08/25/2011   TRIG 48 08/25/2011   HDL 48 08/25/2011   LDLCALC 58 08/25/2011   ALT 141* 08/29/2011   AST 71* 08/29/2011   NA 134* 09/05/2011   K 3.1* 09/05/2011   CL 92* 09/05/2011   CREATININE 0.87 09/05/2011   BUN 24* 09/05/2011   CO2 36* 09/05/2011   INR 1.89* 08/25/2011   For aicd tomorrow  Delight Ovens MD  Beeper [phone] Office [phone] 09/05/2011 8:07 PM

## 2011-09-05 NOTE — Progress Notes (Signed)
Patient ID: Gabriel Marsh, male   DOB: [birth year], 78 y.o.   MRN: [id number] Subjective:  No chest pain, dyspnea improved.  Objective:  Vital Signs in the last 24 hours: Temp:  [97.3 F (36.3 C)-98.6 F (37 C)] 97.7 F (36.5 C) (08/12 1118) Pulse Rate:  [48-130] 69  (08/12 1200) Resp:  [16-26] 17  (08/12 1200) BP: (97-138)/(32-76) 104/56 mmHg (08/12 1200) SpO2:  [89 %-99 %] 99 % (08/12 1200) Weight:  [187 lb 13.3 oz (85.2 kg)] 187 lb 13.3 oz (85.2 kg) (08/12 0630)  Intake/Output from previous day: 08/11 0701 - 08/12 0700 In: 1043.8 [P.O.:240; I.V.:695.8; IV Piggyback:108] Out: 4782 [Urine:3675; Stool:1] Intake/Output from this shift: Total I/O In: 532.5 [P.O.:420; I.V.:108.5; IV Piggyback:4] Out: 1375 [Urine:1375]  Physical Exam: Well appearing elderly man, NAD HEENT: Unremarkable Neck:   6 cm, JVD, no thyromegally Lungs:  Clear with no wheezes HEART:  Regular rate rhythm, no murmurs, no rubs, no clicks Abd:  Flat, positive bowel sounds, no organomegally, no rebound, no guarding Ext:  2 plus pulses, no edema, no cyanosis, no clubbing Skin:  No rashes no nodules Neuro:  CN II through XII intact, motor grossly intact  Lab Results:  Basename 09/05/11 0419 09/04/11 0400  WBC 14.2* 15.0*  HGB 8.7* 8.7*  PLT 432* 432*    Basename 09/05/11 0419 09/04/11 0400  NA 134* 135  K 3.1* 3.5  CL 92* 95*  CO2 36* 32  GLUCOSE 107* 151*  BUN 24* 26*  CREATININE 0.87 0.82   No results found for this basename: TROPONINI:2,CK,MB:2 in the last 72 hours Hepatic Function Panel No results found for this basename: PROT,ALBUMIN,AST,ALT,ALKPHOS,BILITOT,BILIDIR,IBILI in the last 72 hours No results found for this basename: CHOL in the last 72 hours No results found for this basename: PROTIME in the last 72 hours  Imaging: Dg Chest Port 1 View  09/05/2011  *RADIOLOGY REPORT*  Clinical Data: PICC placement  PORTABLE CHEST - 1 VIEW  Comparison: Yesterday  Findings: Stable cardiomegaly.   Diffuse edema slightly improved. Bilateral pleural effusions not significantly changed.  Right internal jugular vein central venous catheter stable.  Right PICC placed with its tip in the right atrium.  This should be retracted 2 cm.  IMPRESSION: PICC tip in the right atrium.  This should be retracted 2 cm.  Edema slightly improved.  Original Report Authenticated By: Donavan Burnet, M.D.   Dg Chest Port 1 View  09/04/2011  *RADIOLOGY REPORT*  Clinical Data: Pulmonary edema  PORTABLE CHEST - 1 VIEW  Comparison: 09/03/2011; 09/02/2011; 09/01/2011  Findings: Grossly unchanged large cardiac silhouette and mediastinal contours post median sternotomy and CABG.  Stable position of support apparatus.  The pulmonary vasculature remains indistinct with cephalization of flow.  Grossly unchanged small bilateral effusions and bibasilar heterogeneous opacity.  No pneumothorax.  Unchanged bones.  IMPRESSION: Grossly unchanged findings of pulmonary edema with small bilateral effusions and bibasilar opacities, left greater than right, atelectasis versus infiltrate.  Original Report Authenticated By: Waynard Reeds, M.D.    Cardiac Studies: Tele - NSR, rare PVC's Assessment/Plan:  1. Post op VT/VF 2. S/p AVR/CABG 3. HTN Rec: will wean IV amio off today, start back po amio, plan ICD tomorrow p.m., replete potassium.  LOS: 13 days    Buel Ream.D. 09/05/2011, 12:13 PM    All

## 2011-09-06 ENCOUNTER — Encounter (HOSPITAL_COMMUNITY)
Admission: AD | Disposition: A | Discharge status: Rehabilitation Facility | Payer: Self-pay | Source: Other Acute Inpatient Hospital | Attending: Cardiothoracic Surgery

## 2011-09-06 DIAGNOSIS — I4901 Ventricular fibrillation: Secondary | ICD-10-CM

## 2011-09-06 HISTORY — PX: IMPLANTABLE CARDIOVERTER DEFIBRILLATOR IMPLANT: SHX5473

## 2011-09-06 LAB — MAGNESIUM: Magnesium: 2.3 mg/dL (ref 1.5–2.5)

## 2011-09-06 LAB — CBC
HCT: 28.1 % — ABNORMAL LOW (ref 39.0–52.0)
Hemoglobin: 8.9 g/dL — ABNORMAL LOW (ref 13.0–17.0)
MCH: 28.7 pg (ref 26.0–34.0)
MCHC: 31.7 g/dL (ref 30.0–36.0)
MCV: 90.6 fL (ref 78.0–100.0)
Platelets: 428 10*3/uL — ABNORMAL HIGH (ref 150–400)
RBC: 3.1 MIL/uL — ABNORMAL LOW (ref 4.22–5.81)
RDW: 15.6 % — ABNORMAL HIGH (ref 11.5–15.5)
WBC: 13.2 10*3/uL — ABNORMAL HIGH (ref 4.0–10.5)

## 2011-09-06 LAB — GLUCOSE, CAPILLARY
Glucose-Capillary: 91 mg/dL (ref 70–99)
Glucose-Capillary: 104 mg/dL — ABNORMAL HIGH (ref 70–99)

## 2011-09-06 LAB — BASIC METABOLIC PANEL
Chloride: 95 mEq/L — ABNORMAL LOW (ref 96–112)
GFR calc Af Amer: 79 mL/min — ABNORMAL LOW (ref >90)
GFR calc non Af Amer: 68 mL/min — ABNORMAL LOW (ref >90)
Potassium: 4.3 mEq/L (ref 3.5–5.1)

## 2011-09-06 SURGERY — IMPLANTABLE CARDIOVERTER DEFIBRILLATOR IMPLANT
Anesthesia: LOCAL

## 2011-09-06 MED ORDER — AMIODARONE HCL 200 MG PO TABS
400.0000 mg | ORAL_TABLET | Freq: Every day | ORAL | Status: DC
  Administered 2011-09-06 – 2011-09-08 (×3): 400 mg via ORAL
  Filled 2011-09-06 (×3): qty 2

## 2011-09-06 MED ORDER — ACETAMINOPHEN 325 MG PO TABS: 325.0000 mg | ORAL_TABLET | ORAL | Status: DC | PRN

## 2011-09-06 MED ORDER — MIDAZOLAM HCL 2 MG/2ML IJ SOLN
INTRAMUSCULAR | Status: AC
  Filled 2011-09-06: qty 2

## 2011-09-06 MED ORDER — FENTANYL CITRATE 0.05 MG/ML IJ SOLN
INTRAMUSCULAR | Status: AC
  Filled 2011-09-06: qty 2

## 2011-09-06 MED ORDER — LIDOCAINE HCL (PF) 1 % IJ SOLN
INTRAMUSCULAR | Status: AC
  Filled 2011-09-06: qty 60

## 2011-09-06 MED ORDER — VANCOMYCIN HCL 1000 MG IV SOLR
1500.0000 mg | Freq: Two times a day (BID) | INTRAVENOUS | Status: AC
  Administered 2011-09-06: 1500 mg via INTRAVENOUS
  Filled 2011-09-06: qty 1500

## 2011-09-06 MED ORDER — ENSURE PUDDING PO PUDG
1.0000 | Freq: Three times a day (TID) | ORAL | Status: DC
  Administered 2011-09-06 – 2011-09-08 (×6): 1 via ORAL

## 2011-09-06 MED ORDER — SODIUM CHLORIDE 0.9 % IV SOLN: INTRAVENOUS | Status: DC

## 2011-09-06 MED ORDER — HEPARIN SODIUM (PORCINE) 1000 UNIT/ML IJ SOLN
INTRAMUSCULAR | Status: AC
  Filled 2011-09-06: qty 1

## 2011-09-06 MED ORDER — ONDANSETRON HCL 4 MG/2ML IJ SOLN: 4.0000 mg | Freq: Four times a day (QID) | INTRAMUSCULAR | Status: DC | PRN

## 2011-09-06 NOTE — Progress Notes (Signed)
Orthopedic Tech Progress Note Patient Details:  Gabriel Marsh 03-11-1930 [id number]  Patient ID: [name], male   DOB: July 06, 1930, 78 y.o.   MRN: [id number] Confirmed pt has arm sling.  Julian Medina T 09/06/2011, 1:29 PM

## 2011-09-06 NOTE — Progress Notes (Signed)
Spoke with Dr. Ladona Ridgel this AM and was advised to hold lasix until after pacemaker/ICD insertion, pt will be next case for pacemaker.  Also advised to give other P.O. Medications (Metoprolol, KCL).  Will reschedule lasix for post procedure unless delayed as per Dr. Ladona Ridgel.

## 2011-09-06 NOTE — Op Note (Signed)
DDD ICD implant via the left subclavian vein without immediate complication.Z#[id number].

## 2011-09-06 NOTE — Progress Notes (Signed)
Patient ID: Gabriel Marsh, male   DOB: [birth year], 78 y.o.   MRN: [id number] TCTS DAILY PROGRESS NOTE                   [address]            [place] [address]          [phone]      12 Days Post-Op Procedure(s) (LRB): CORONARY ARTERY BYPASS GRAFTING (CABG) (N/A) AORTIC VALVE REPLACEMENT (AVR) (N/A)  Total Length of Stay:  LOS: 14 days   Subjective: Feels better, less edema  Objective: Vital signs in last 24 hours: Temp:  [97.5 F (36.4 C)-98 F (36.7 C)] 97.7 F (36.5 C) (08/13 0722) Pulse Rate:  [69-80] 72  (08/13 0700) Cardiac Rhythm:  [-] Normal sinus rhythm (08/12 2000) Resp:  [17-26] 20  (08/13 0700) BP: (97-116)/(52-90) 100/53 mmHg (08/13 0700) SpO2:  [82 %-99 %] 96 % (08/13 0737) Weight:  [183 lb 13.8 oz (83.4 kg)] 183 lb 13.8 oz (83.4 kg) (08/13 0500)  Filed Weights   09/04/11 0600 09/05/11 0630 09/06/11 0500  Weight: 193 lb 9 oz (87.8 kg) 187 lb 13.3 oz (85.2 kg) 183 lb 13.8 oz (83.4 kg)    Weight change: -3 lb 15.5 oz (-1.8 kg)   Hemodynamic parameters for last 24 hours:    Intake/Output from previous day: 08/12 0701 - 08/13 0700 In: 923.4 [P.O.:600; I.V.:315.4; IV Piggyback:8] Out: 3225 [Urine:3225]  Intake/Output this shift:    Current Meds: Scheduled Meds:   . aspirin EC  325 mg Oral Daily  . chlorhexidine  60 mL Topical Once  . chlorhexidine  60 mL Topical Once  . enoxaparin  30 mg Subcutaneous Q24H  . feeding supplement  237 mL Oral BID BM  . folic acid  1 mg Oral Daily  . furosemide  40 mg Intravenous BID  . gentamicin irrigation  80 mg Irrigation On Call  . insulin aspart  0-24 Units Subcutaneous TID AC & HS  . iron polysaccharides  150 mg Oral Daily  . levalbuterol  0.63 mg Nebulization TID  . metoprolol tartrate  50 mg Oral BID  . potassium chloride  10 mEq Intravenous Q1 Hr x 3  . potassium chloride  40 mEq Oral BID  . simvastatin  5 mg Oral q1800  . sodium chloride  10-40 mL Intracatheter Q12H  . sodium  chloride  3 mL Intravenous Q12H  . spironolactone  25 mg Oral Daily  . vancomycin  1,000 mg Intravenous On Call  . DISCONTD: levalbuterol  0.63 mg Nebulization Q6H  . DISCONTD: levalbuterol  0.63 mg Nebulization TID   Continuous Infusions:   . sodium chloride    . amiodarone (NEXTERONE PREMIX) 360 mg/200 mL dextrose 0.5 mg/min (09/06/11 0635)   PRN Meds:.sodium chloride, diphenhydrAMINE, guaiFENesin, levalbuterol, sodium chloride, sodium chloride, traMADol  General appearance: alert, cooperative and no distress Neurologic: intact Heart: regular rate and rhythm, S1, S2 normal, no murmur, click, rub or gallop and normal apical impulse Lungs: diminished breath sounds bibasilar Abdomen: soft, non-tender; bowel sounds normal; no masses,  no organomegaly Wound: sternum stable  Lab Results: CBC: Basename 09/06/11 0445 09/05/11 0419  WBC 13.2* 14.2*  HGB 8.9* 8.7*  HCT 28.1* 26.9*  PLT 428* 432*   BMET:  Basename 09/06/11 0445 09/05/11 0419  NA 138 134*  K 4.3 3.1*  CL 95* 92*  CO2 37* 36*  GLUCOSE 155* 107*  BUN 23 24*  CREATININE 1.02 0.87  CALCIUM 8.4 8.7    PT/INR: No results found for this basename: LABPROT,INR in the last 72 hours Radiology: Dg Chest Port 1 View  09/05/2011  *RADIOLOGY REPORT*  Clinical Data: PICC placement  PORTABLE CHEST - 1 VIEW  Comparison: Yesterday  Findings: Stable cardiomegaly.  Diffuse edema slightly improved. Bilateral pleural effusions not significantly changed.  Right internal jugular vein central venous catheter stable.  Right PICC placed with its tip in the right atrium.  This should be retracted 2 cm.  IMPRESSION: PICC tip in the right atrium.  This should be retracted 2 cm.  Edema slightly improved.  Original Report Authenticated By: Donavan Burnet, M.D.     Assessment/Plan: S/P Procedure(s) (LRB): CORONARY ARTERY BYPASS GRAFTING (CABG) (N/A) AORTIC VALVE REPLACEMENT (AVR) (N/A) For AICD today Continue diuretic, aldactone added  yesterday     Delight Ovens MD  Beeper [phone] Office [phone] 09/06/2011 7:48 AM

## 2011-09-06 NOTE — Progress Notes (Addendum)
Nutrition Follow-up  Intervention:    D/C Ensure Complete supplement  Ensure Pudding supplement 3 times daily (170 kcals, 4 gm protein per 4 oz cup) RD to follow for nutrition care plan  Assessment:   S/p implantable cardioverter-defibrillator (ICD) implant today. IV Lasix continues. No current % PO intake documented in flowsheet records. Per tray observation and discussion with patient, RD suspects intake to be approximately 50-75%. He states he's been receiving Ensure Pudding and that he prefers this to Ensure Complete -- RD to clarify orders.   Diet Order:  Heart Healthy  Meds: Scheduled Meds:   . amiodarone  400 mg Oral Daily  . aspirin EC  325 mg Oral Daily  . chlorhexidine  60 mL Topical Once  . enoxaparin  30 mg Subcutaneous Q24H  . feeding supplement  237 mL Oral BID BM  . fentaNYL      . folic acid  1 mg Oral Daily  . furosemide  40 mg Intravenous BID  . heparin      . insulin aspart  0-24 Units Subcutaneous TID AC & HS  . iron polysaccharides  150 mg Oral Daily  . levalbuterol  0.63 mg Nebulization TID  . lidocaine      . metoprolol tartrate  50 mg Oral BID  . midazolam      . midazolam      . potassium chloride  40 mEq Oral BID  . simvastatin  5 mg Oral q1800  . sodium chloride  10-40 mL Intracatheter Q12H  . sodium chloride  3 mL Intravenous Q12H  . spironolactone  25 mg Oral Daily  . vancomycin  1,500 mg Intravenous Q12H  . DISCONTD: chlorhexidine  60 mL Topical Once  . DISCONTD: gentamicin irrigation  80 mg Irrigation On Call  . DISCONTD: levalbuterol  0.63 mg Nebulization TID  . DISCONTD: vancomycin  1,000 mg Intravenous On Call   Continuous Infusions:   . DISCONTD: sodium chloride 20 mL/hr (09/06/11 0924)  . DISCONTD: amiodarone (NEXTERONE PREMIX) 360 mg/200 mL dextrose 0.5 mg/min (09/06/11 0635)   PRN Meds:.acetaminophen, diphenhydrAMINE, guaiFENesin, levalbuterol, ondansetron (ZOFRAN) IV, sodium chloride, sodium chloride, traMADol, DISCONTD: sodium  chloride  Labs:  CMP     Component Value Date/Time   NA 138 09/06/2011 0445   K 4.3 09/06/2011 0445   CL 95* 09/06/2011 0445   CO2 37* 09/06/2011 0445   GLUCOSE 155* 09/06/2011 0445   BUN 23 09/06/2011 0445   CREATININE 1.02 09/06/2011 0445   CALCIUM 8.4 09/06/2011 0445   PROT 5.2* 08/29/2011 0428   ALBUMIN 2.4* 08/29/2011 0428   AST 71* 08/29/2011 0428   ALT 141* 08/29/2011 0428   ALKPHOS 57 08/29/2011 0428   BILITOT 0.4 08/29/2011 0428   GFRNONAA 68* 09/06/2011 0445   GFRAA 79* 09/06/2011 0445     Intake/Output Summary (Last 24 hours) at 09/06/11 1522 Last data filed at 09/06/11 1458  Gross per 24 hour  Intake  205.8 ml  Output   2225 ml  Net -2019.2 ml    CBG (last 3)   Basename 09/06/11 1150 09/06/11 0721 09/05/11 2129  GLUCAP 105* 101* 101*    Weight Status:  83.4 kg (813) -- down likely due to fluid loss  Estimated needs: 2000-2200 kcals, 100-110 gm protein   Nutrition Dx: Inadequate Oral Intake, ongoing   Goal: Oral intake with meals & supplements to meet >/= 90% of estimated nutrition needs, progressing   Monitor: PO & supplemental intake, weight, labs, I/O's   Santina Evans  Ranae Palms, RD, LDN  Pager #: [phone]  After-Hours Pager #: [phone]

## 2011-09-06 NOTE — Consult Note (Signed)
Physical Medicine and Rehabilitation Consult Reason for Consult: Deconditioning after CABG Referring Physician: Dr. Tyrone Sage   HPI: Gabriel Marsh is a 78 y.o. right-handed male with history of coronary artery disease and severe aortic stenosis as well as congestive heart failure. Admitted 08/23/2011 with pulmonary edema and acute onset of systolic dysfunction. Cardiac enzymes were positive. Patient with nonspecific chest pain to the right shoulder. Echocardiogram revealed critical aortic stenosis with severe left ventricular dysfunction and ejection fraction of 25%. A cardiac catheterization was completed revealing severe three-vessel disease. Underwent coronary artery bypass grafting x3 with right leg endo vein harvesting and aortic valve replacement 08/29/2011 per Dr. Tyrone Sage. Patient placed on aspirin therapy as well as subcutaneous Lovenox. Postoperative atrial fibrillation maintained on amiodarone as well as Lopressor. Plan ICD placement per cardiology services 09/06/2011.no postoperative complications. Physical therapy ongoing noted deconditioning and still needing oxygen therapy during activities. M.D. is requested physical medicine rehabilitation consult to consider inpatient rehabilitation services   Review of Systems  Cardiovascular: Positive for palpitations and leg swelling.  Gastrointestinal: Positive for constipation.  Neurological: Positive for weakness.  All other systems reviewed and are negative.   Past Medical History  Diagnosis Date  . Hypertension   . Hyperlipidemia   . Mitral regurgitation     Moderate by 08/20/11 echo  . Shortness of breath   . CAD (coronary artery disease)     a) 08/22/11 cath :  50-60% left main stenosis, mild-moderate LAD disease, 99% ostial large diagonal stenosis, occluded and collateralized LCx and RCA  . Aortic stenosis     Severe by 08/20/11 echo  . Aortic insufficiency     Moderate by 08/20/11 echo  . Chronic systolic CHF (congestive  heart failure)     a) 08/20/11 echo : LVEF 35-40%   Past Surgical History  Procedure Date  . Eye surgery   . Cardiac catheterization 08/22/11     50-60% left main stenosis, mild-moderate LAD disease, 99% ostial large diagonal stenosis, occluded and collateralized LCx and RCA  . Coronary artery bypass graft 08/25/2011    Procedure: CORONARY ARTERY BYPASS GRAFTING (CABG);  Surgeon: Delight Ovens, MD;  Location: [facility] OR;  Service: Open Heart Surgery;  Laterality: N/A;  Hot and humid all through surgery.  . Aortic valve replacement 08/25/2011    Procedure: AORTIC VALVE REPLACEMENT (AVR);  Surgeon: Delight Ovens, MD;  Location: [facility] OR;  Service: Open Heart Surgery;  Laterality: N/A;  hot and humid all through surgery.   Family History  Problem Relation Age of Onset  . Heart disease Brother    Social History:  reports that he quit smoking about 16 years ago. He does not have any smokeless tobacco history on file. He reports that he does not drink alcohol or use illicit drugs. Allergies:  Allergies  Allergen Reactions  . Fish Allergy   . Lisinopril     cough  . Penicillins     Hives   . Statins     Leg cramps  . Sulfa Antibiotics     Hives    Medications Prior to Admission  Medication Sig Dispense Refill  . aspirin 81 MG tablet Take 160 mg by mouth daily.      Marland Kitchen DISCONTD: Cholecalciferol (VITAMIN D PO) Take 2,000 Int'l Units by mouth daily.      Marland Kitchen DISCONTD: valsartan-hydrochlorothiazide (DIOVAN-HCT) 80-12.5 MG per tablet Take 1 tablet by mouth daily.        Home: Home Living Lives With: Alone (Will go to niece's  house at [place]) Available Help at Discharge: Family;Available 24 hours/day (Niece) Type of Home: House Home Access: Stairs to enter Secretary/administrator of Steps: 10 Entrance Stairs-Rails: Right Home Layout: One level (except 2 steps up to bedroom.) Home Adaptive Equipment: Walker - rolling (Niece has one available)  Functional History: Prior Function Able to Take  Stairs?: Yes Driving: Yes Vocation: Retired Functional Status:  Mobility: Bed Mobility Bed Mobility: Rolling Left Rolling Left: 4: Min assist Left Sidelying to Sit: 4: Min assist Sitting - Scoot to [place] of Bed: 5: Supervision Sit to Supine: 4: Min assist Transfers Transfers: Sit to Stand;Stand to Sit Sit to Stand: 4: Min assist;From bed Stand to Sit: 4: Min assist;To chair/3-in-1 Stand Pivot Transfers: 4: Min assist Ambulation/Gait Ambulation/Gait Assistance: 4: Min Environmental consultant (Feet): 75 Feet Assistive device: Other (Comment) (WC) Ambulation/Gait Assistance Details: unsteady gait when he lets go of WC in room, posterior stagger LOB.   Gait Pattern: Step-through pattern;Shuffle Gait velocity: less than 1.8 ft/sec putting him at risk for recurrent falls General Gait Details: DOE increased to 3/4 and O2 sats dropped to 82 on RA.      ADL:    Cognition: Cognition Arousal/Alertness: Awake/alert Orientation Level: Oriented X4 Cognition Overall Cognitive Status: Appears within functional limits for tasks assessed/performed Arousal/Alertness: Awake/alert Orientation Level: Appears intact for tasks assessed Behavior During Session: [facility] for tasks performed Cognition - Other Comments: HOH, deaf in left ear  Blood pressure 100/53, pulse 72, temperature 97.7 F (36.5 C), temperature source Oral, resp. rate 20, height 5\' 10"  (1.778 m), weight 83.4 kg (183 lb 13.8 oz), SpO2 96.00%. Physical Exam  Constitutional: He is oriented to person, place, and time. He appears well-developed.  HENT:  Head: Normocephalic.  Eyes:       Pupils round and reactive to light  Neck: Neck supple. No thyromegaly present.  Cardiovascular:       Cardiac rate controlled  Pulmonary/Chest: Breath sounds normal. He has no wheezes.  Abdominal: Bowel sounds are normal. He exhibits no distension. There is no tenderness.  Musculoskeletal: He exhibits no edema.  Neurological: He is alert and  oriented to person, place, and time.  Skin:       Chest incision well approximated clean and dry  Psychiatric: He has a normal mood and affect.  motor strength is 5/5 in the right deltoid, biceps, triceps, grip. 4/5 in bilateral hip flexors knee extensors and ankle dorsiflexors and plantar flexors Left upper extremity has good grip strength however remainder of muscle testing was not performed secondary to restrictions from recent surgery Sensation is intact to light touch in both upper and lower extremities   Results for orders placed during the hospital encounter of 08/23/11 (from the past 24 hour(s))  GLUCOSE, CAPILLARY     Status: Abnormal   Collection Time   09/05/11 11:20 AM      Component Value Range   Glucose-Capillary 116 (*) 70 - 99 mg/dL   Comment 1 Documented in Chart     Comment 2 Notify RN    GLUCOSE, CAPILLARY     Status: Abnormal   Collection Time   09/05/11  5:42 PM      Component Value Range   Glucose-Capillary 118 (*) 70 - 99 mg/dL   Comment 1 Documented in Chart     Comment 2 Notify RN    GLUCOSE, CAPILLARY     Status: Abnormal   Collection Time   09/05/11  9:29 PM      Component  Value Range   Glucose-Capillary 101 (*) 70 - 99 mg/dL   Comment 1 Documented in Chart     Comment 2 Notify RN    BASIC METABOLIC PANEL     Status: Abnormal   Collection Time   09/06/11  4:45 AM      Component Value Range   Sodium 138  135 - 145 mEq/L   Potassium 4.3  3.5 - 5.1 mEq/L   Chloride 95 (*) 96 - 112 mEq/L   CO2 37 (*) 19 - 32 mEq/L   Glucose, Bld 155 (*) 70 - 99 mg/dL   BUN 23  6 - 23 mg/dL   Creatinine, Ser 6.30  0.50 - 1.35 mg/dL   Calcium 8.4  8.4 - 16.0 mg/dL   GFR calc non Af Amer 68 (*) >90 mL/min   GFR calc Af Amer 79 (*) >90 mL/min  CBC     Status: Abnormal   Collection Time   09/06/11  4:45 AM      Component Value Range   WBC 13.2 (*) 4.0 - 10.5 K/uL   RBC 3.10 (*) 4.22 - 5.81 MIL/uL   Hemoglobin 8.9 (*) 13.0 - 17.0 g/dL   HCT 10.9 (*) 32.3 - 55.7 %    MCV 90.6  78.0 - 100.0 fL   MCH 28.7  26.0 - 34.0 pg   MCHC 31.7  30.0 - 36.0 g/dL   RDW 32.2 (*) 02.5 - 42.7 %   Platelets 428 (*) 150 - 400 K/uL   Dg Chest Port 1 View  09/05/2011  *RADIOLOGY REPORT*  Clinical Data: PICC placement  PORTABLE CHEST - 1 VIEW  Comparison: Yesterday  Findings: Stable cardiomegaly.  Diffuse edema slightly improved. Bilateral pleural effusions not significantly changed.  Right internal jugular vein central venous catheter stable.  Right PICC placed with its tip in the right atrium.  This should be retracted 2 cm.  IMPRESSION: PICC tip in the right atrium.  This should be retracted 2 cm.  Edema slightly improved.  Original Report Authenticated By: Donavan Burnet, M.D.    Assessment/Plan: Diagnosis: deconditioning after coronary artery bypass grafting as well as implantable cardiac defibrillator placement. 1. Does the need for close, 24 hr/day medical supervision in concert with the patient's rehab needs make it unreasonable for this patient to be served in a less intensive setting? Yes 2. Co-Morbidities requiring supervision/potential complications: severe aortic stenosis, chronic congestive heart failure, moderate mitral regurgitation, atrial for relation, history of non-ST elevation myocardial infarction 3. Due to bladder management, bowel management, safety, skin/wound care, disease management, medication administration and patient education, does the patient require 24 hr/day rehab nursing? Yes 4. Does the patient require coordinated care of a physician, rehab nurse, PT (1-2 hrs/day, 5 days/week) and OT (1-2 hrs/day, 5 days/week) to address physical and functional deficits in the context of the above medical diagnosis(es)? Yes Addressing deficits in the following areas: balance, endurance, locomotion, strength, transferring, bathing, dressing and toileting 5. Can the patient actively participate in an intensive therapy program of at least 3 hrs of therapy per day at  least 5 days per week? Yes 6. The potential for patient to make measurable gains while on inpatient rehab is good 7. Anticipated functional outcomes upon discharge from inpatient rehab are supervision mobility with PT, supervision to modified independent ADLs with OT, not applicable with SLP. 8. Estimated rehab length of stay to reach the above functional goals is: 7-10 days 9. Does the patient have adequate social supports to  accommodate these discharge functional goals? Potentially 10. Anticipated D/C setting: Home 11. Anticipated post D/C treatments: HH therapy 12. Overall Rehab/Functional Prognosis: good  RECOMMENDATIONS: This patient's condition is appropriate for continued rehabilitative care in the following setting: CIR Patient has agreed to participate in recommended program. Yes Note that insurance prior authorization may be required for reimbursement for recommended care.  Comment:need to confirm availability of 24 7 caregiver post hospitalization    09/06/2011

## 2011-09-06 NOTE — Plan of Care (Signed)
Problem: Phase I Progression Outcomes Goal: Hemodynamically stable Outcome: Progressing Pt heart rhythm stable. Pt for possible ICD placement today.

## 2011-09-06 NOTE — Progress Notes (Signed)
Patient ID: Gabriel Marsh, male   DOB: [birth year], 78 y.o.   MRN: [id number] Subjective:  Feels better. Awaiting ICD  Objective:  Vital Signs in the last 24 hours: Temp:  [97.5 F (36.4 C)-98 F (36.7 C)] 97.7 F (36.5 C) (08/13 0722) Pulse Rate:  [69-80] 72  (08/13 0700) Resp:  [17-26] 20  (08/13 0700) BP: (97-116)/(52-90) 100/53 mmHg (08/13 0700) SpO2:  [82 %-99 %] 96 % (08/13 0737) Weight:  [183 lb 13.8 oz (83.4 kg)] 183 lb 13.8 oz (83.4 kg) (08/13 0500)  Intake/Output from previous day: 08/12 0701 - 08/13 0700 In: 923.4 [P.O.:600; I.V.:315.4; IV Piggyback:8] Out: 3225 [Urine:3225] Intake/Output from this shift:    Physical Exam: Well appearing NAD HEENT: Unremarkable Neck:  No JVD, no thyromegally Lymphatics:  No adenopathy Back:  No CVA tenderness Lungs:  Clear HEART:  Regular rate rhythm, no murmurs, no rubs, no clicks Abd:  Flat, positive bowel sounds, no organomegally, no rebound, no guarding Ext:  2 plus pulses, no edema, no cyanosis, no clubbing Skin:  No rashes no nodules Neuro:  CN II through XII intact, motor grossly intact  Lab Results:  Basename 09/06/11 0445 09/05/11 0419  WBC 13.2* 14.2*  HGB 8.9* 8.7*  PLT 428* 432*    Basename 09/06/11 0445 09/05/11 0419  NA 138 134*  K 4.3 3.1*  CL 95* 92*  CO2 37* 36*  GLUCOSE 155* 107*  BUN 23 24*  CREATININE 1.02 0.87   No results found for this basename: TROPONINI:2,CK,MB:2 in the last 72 hours Hepatic Function Panel No results found for this basename: PROT,ALBUMIN,AST,ALT,ALKPHOS,BILITOT,BILIDIR,IBILI in the last 72 hours No results found for this basename: CHOL in the last 72 hours No results found for this basename: PROTIME in the last 72 hours  Imaging: Dg Chest Port 1 View  09/05/2011  *RADIOLOGY REPORT*  Clinical Data: PICC placement  PORTABLE CHEST - 1 VIEW  Comparison: Yesterday  Findings: Stable cardiomegaly.  Diffuse edema slightly improved. Bilateral pleural effusions not significantly  changed.  Right internal jugular vein central venous catheter stable.  Right PICC placed with its tip in the right atrium.  This should be retracted 2 cm.  IMPRESSION: PICC tip in the right atrium.  This should be retracted 2 cm.  Edema slightly improved.  Original Report Authenticated By: Donavan Burnet, M.D.    Cardiac Studies: Tele - nsr Assessment/Plan:  1. VF arrest post AVR/CABG 2. S/p CABG/AVR 3. HTN 4. Chronic CHF ( systolic) Rec: I have discussed the risks/benefits/goals/expectations of ICD implant and he wishes to proceed.  LOS: 14 days    Buel Ream.D. 09/06/2011, 8:17 AM

## 2011-09-06 NOTE — Progress Notes (Signed)
TCTS BRIEF SICU PROGRESS NOTE  Day of Surgery  S/P Procedure(s) (LRB): IMPLANTABLE CARDIOVERTER DEFIBRILLATOR IMPLANT (N/A)   Stable since return from cath lab  Plan: Continue current plan  Purcell Nails 09/06/2011 8:17 PM

## 2011-09-07 ENCOUNTER — Ambulatory Visit (HOSPITAL_COMMUNITY): Payer: Medicare Other

## 2011-09-07 LAB — CBC
MCH: 29.4 pg (ref 26.0–34.0)
MCV: 90.3 fL (ref 78.0–100.0)
Platelets: 379 10*3/uL (ref 150–400)
RDW: 15.7 % — ABNORMAL HIGH (ref 11.5–15.5)
WBC: 11.9 10*3/uL — ABNORMAL HIGH (ref 4.0–10.5)

## 2011-09-07 LAB — GLUCOSE, CAPILLARY: Glucose-Capillary: 125 mg/dL — ABNORMAL HIGH (ref 70–99)

## 2011-09-07 MED ORDER — FUROSEMIDE 40 MG PO TABS
40.0000 mg | ORAL_TABLET | Freq: Two times a day (BID) | ORAL | Status: DC
Start: 2011-09-08 — End: 2011-09-08
  Administered 2011-09-08: 40 mg via ORAL
  Filled 2011-09-07 (×2): qty 1

## 2011-09-07 MED ORDER — BISACODYL 5 MG PO TBEC: 10.0000 mg | DELAYED_RELEASE_TABLET | Freq: Every day | ORAL | Status: DC | PRN

## 2011-09-07 MED ORDER — POTASSIUM CHLORIDE CRYS ER 20 MEQ PO TBCR: 40.0000 meq | EXTENDED_RELEASE_TABLET | Freq: Once | ORAL | Status: DC

## 2011-09-07 MED ORDER — DOCUSATE SODIUM 100 MG PO CAPS
200.0000 mg | ORAL_CAPSULE | Freq: Every day | ORAL | Status: DC
  Administered 2011-09-07 – 2011-09-08 (×2): 200 mg via ORAL
  Filled 2011-09-07 (×2): qty 2

## 2011-09-07 MED ORDER — FUROSEMIDE 10 MG/ML IJ SOLN
40.0000 mg | Freq: Once | INTRAMUSCULAR | Status: AC
  Administered 2011-09-07: 40 mg via INTRAVENOUS

## 2011-09-07 MED ORDER — SODIUM CHLORIDE 0.9 % IJ SOLN
3.0000 mL | Freq: Two times a day (BID) | INTRAMUSCULAR | Status: DC
  Administered 2011-09-07: 3 mL via INTRAVENOUS

## 2011-09-07 MED ORDER — SODIUM CHLORIDE 0.9 % IJ SOLN: 3.0000 mL | INTRAMUSCULAR | Status: DC | PRN

## 2011-09-07 MED ORDER — SODIUM CHLORIDE 0.9 % IV SOLN: 250.0000 mL | INTRAVENOUS | Status: DC | PRN

## 2011-09-07 MED ORDER — FUROSEMIDE 40 MG PO TABS: 40.0000 mg | ORAL_TABLET | Freq: Two times a day (BID) | ORAL | Status: DC

## 2011-09-07 MED ORDER — ACETAMINOPHEN 325 MG PO TABS: 650.0000 mg | ORAL_TABLET | Freq: Four times a day (QID) | ORAL | Status: DC | PRN

## 2011-09-07 MED ORDER — PANTOPRAZOLE SODIUM 40 MG PO TBEC
40.0000 mg | DELAYED_RELEASE_TABLET | Freq: Every day | ORAL | Status: DC
  Administered 2011-09-08: 40 mg via ORAL
  Filled 2011-09-07: qty 1

## 2011-09-07 MED ORDER — BISACODYL 10 MG RE SUPP: 10.0000 mg | Freq: Every day | RECTAL | Status: DC | PRN

## 2011-09-07 MED ORDER — MOVING RIGHT ALONG BOOK
Freq: Once | Status: AC
  Administered 2011-09-07: 09:00:00
  Filled 2011-09-07: qty 1

## 2011-09-07 NOTE — Progress Notes (Signed)
Inpatient Rehab Admissions: Met with pt to discuss CIR. Pt very interested in coming to rehab as soon as medically stable. Discussed rehab routine, what to expect and provided rehab handouts. Will f/u with pt tomorrow. Call with questions: [phone]

## 2011-09-07 NOTE — Progress Notes (Signed)
Patient ID: Gabriel Marsh, male   DOB: 1930/10/29, 78 y.o.   MRN: [id number] Subjective:  "I feel good"  Objective:  Vital Signs in the last 24 hours: Temp:  [97.3 F (36.3 C)-98 F (36.7 C)] 97.6 F (36.4 C) (08/14 0000) Pulse Rate:  [67-88] 78  (08/14 0700) Resp:  [16-26] 21  (08/14 0700) BP: (85-117)/(48-74) 99/52 mmHg (08/14 0700) SpO2:  [90 %-99 %] 95 % (08/14 0724) Weight:  [180 lb 1.9 oz (81.7 kg)] 180 lb 1.9 oz (81.7 kg) (08/14 0500)  Intake/Output from previous day: 08/13 0701 - 08/14 0700 In: 568 [P.O.:60; IV Piggyback:508] Out: 3425 [Urine:3425] Intake/Output from this shift:    Physical Exam: Elderly appearing NAD HEENT: Unremarkable Neck:  No JVD, no thyromegally Lungs:  Clear with no wheezes. ICD incision without hematoma HEART:  Regular rate rhythm, no murmurs, no rubs, no clicks Abd:  Flat, positive bowel sounds, no organomegally, no rebound, no guarding Ext:  2 plus pulses, no edema, no cyanosis, no clubbing Skin:  No rashes no nodules Neuro:  CN II through XII intact, motor grossly intact  Lab Results:  Basename 09/07/11 0425 09/06/11 0445  WBC 11.9* 13.2*  HGB 9.1* 8.9*  PLT 379 428*    Basename 09/06/11 0445 09/05/11 0419  NA 138 134*  K 4.3 3.1*  CL 95* 92*  CO2 37* 36*  GLUCOSE 155* 107*  BUN 23 24*  CREATININE 1.02 0.87   No results found for this basename: TROPONINI:2,CK,MB:2 in the last 72 hours Hepatic Function Panel No results found for this basename: PROT,ALBUMIN,AST,ALT,ALKPHOS,BILITOT,BILIDIR,IBILI in the last 72 hours No results found for this basename: CHOL in the last 72 hours No results found for this basename: PROTIME in the last 72 hours  Imaging: Dg Chest Port 1 View  09/05/2011  *RADIOLOGY REPORT*  Clinical Data: PICC placement  PORTABLE CHEST - 1 VIEW  Comparison: Yesterday  Findings: Stable cardiomegaly.  Diffuse edema slightly improved. Bilateral pleural effusions not significantly changed.  Right internal jugular vein  central venous catheter stable.  Right PICC placed with its tip in the right atrium.  This should be retracted 2 cm.  IMPRESSION: PICC tip in the right atrium.  This should be retracted 2 cm.  Edema slightly improved.  Original Report Authenticated By: Donavan Burnet, M.D.    Cardiac Studies: Tele - NSR Assessment/Plan:  1. Recurrent malignant ventricular arrhythmias 2. S/P AVR/CABG 3. S/p ICD Rec: ok to progress activity and routine post op care. Continue po amiodarone 400 daily until I see him back in followup as an outpatient. His ICD is working normally.   LOS: 15 days    Lewayne Bunting 09/07/2011, 7:49 AM

## 2011-09-07 NOTE — Progress Notes (Addendum)
Physical Therapy Treatment Patient Details Name: Gabriel Marsh MRN: [id number] DOB: 08/20/1930 Today's Date: 09/07/2011 Time: 0865-7846 PT Time Calculation (min): 21 min  PT Assessment / Plan / Recommendation Comments on Treatment Session  78 y.o. male s/p AVR/CABG and now s/p ICD.  Left arm sling today due to ICD precautions on left side.  Pt is progressing well, no longer needing O2 for gait. His legs are extremely deconditioned and are his primary limiting factor for walking further down the hallway.      Follow Up Recommendations  Inpatient Rehab    Barriers to Discharge        Equipment Recommendations  Tub/shower seat;Other (comment) (with back and handles, and lift chair per niece)    Recommendations for Other Services OT consult  Frequency Min 3X/week   Plan Discharge plan remains appropriate    Precautions / Restrictions Precautions Precautions: ICD/Pacemaker;Fall;Sternal Precaution Comments: no longer needs O2 for gait.     Pertinent Vitals/Pain O2 sats with gait 98%, no reports of pain    Mobility  Bed Mobility Rolling Left: 4: Min guard Left Sidelying to Sit: 4: Min guard Sitting - Scoot to Edge of Bed: 7: Independent Details for Bed Mobility Assistance: min guard assist to help facilitate mobility at trunk and verbal cues not to push or pull with arms.   Transfers Sit to Stand: 4: Min assist;From bed Stand to Sit: 4: Min assist;To chair/3-in-1 Details for Transfer Assistance: verbal cues to push with legs not pull with arms to maintain sternal precautions.   Ambulation/Gait Ambulation/Gait Assistance: 4: Min guard Ambulation Distance (Feet): 150 Feet Assistive device: Rolling walker (pt steering with righ tarm PT steering with left arm) Ambulation/Gait Assistance Details: min guard assist for the occational small stagger while multi tasking.  Gait velocity: less than 1.8 ft/sec indicating a risk for recurrent falls.    Exercises General Exercises - Lower  Extremity Long Arc Quad: AROM;Both;10 reps;Seated Hip ABduction/ADduction: AROM;Both;10 reps;Seated (adduction only against pillow for resistance) Hip Flexion/Marching: AROM;Both;10 reps Toe Raises: AROM;Both;10 reps;Seated Heel Raises: AROM;Both;10 reps;Seated    PT Goals Acute Rehab PT Goals PT Goal: Supine/Side to Sit - Progress: Progressing toward goal PT Goal: Sit to Stand - Progress: Progressing toward goal PT Goal: Stand to Sit - Progress: Progressing toward goal PT Goal: Ambulate - Progress: Progressing toward goal  Visit Information  Last PT Received On: 09/07/11 Assistance Needed: +1    Subjective Data  Subjective: Pt reports that he has not needed O2 today.  He does now have a sling left arm   Cognition  Overall Cognitive Status: Appears within functional limits for tasks assessed/performed Cognition - Other Comments: HOH, deaf in left ear       End of Session PT - End of Session Activity Tolerance: Patient limited by fatigue (leg fatigue) Patient left: in chair;with call bell/phone within reach Nurse Communication: Mobility status        Lurena Joiner B. Elex Mainwaring, PT, DPT [phone]   09/07/2011, 5:01 PM

## 2011-09-07 NOTE — Progress Notes (Signed)
Pacing wires removed per order.  Bedrest maintained during and after removal. Vital signs checked per routine protocol. Patient tolerated well.  Will continue to monitor.

## 2011-09-07 NOTE — Op Note (Signed)
NAME:  Gabriel Marsh, Gabriel Marsh NO.:  [id number]  MEDICAL RECORD NO.:  [id number]  LOCATION:  2315                         FACILITY:  [facility]  PHYSICIAN:  Doylene Canning. Ladona Ridgel, MD    DATE OF BIRTH:  1931-01-21  DATE OF PROCEDURE:  09/06/2011 DATE OF DISCHARGE:                              OPERATIVE REPORT   PROCEDURE PERFORMED:  Insertion of a dual-chamber ICD with defibrillation threshold testing.  INTRODUCTION:  The patient is a 78 year old man with a history of ischemic cardiomyopathy, status post revascularization as well as aortic valve replacement.  On the fourth postoperative day, he sustained a ventricular fibrillation arrest.  There was no obvious coronary ischemia related.  He had recurrent ventricular arrhythmias, which were ultimately suppressed by amiodarone at high dose.  He is now referred for ICD implantation secondary to all of the above.  PROCEDURE:  After informed consent was obtained, the patient was taken to the Diagnostic EP Lab in the fasting state.  After usual preparation and draping, intravenous fentanyl and midazolam was given for sedation. A 30 mL of lidocaine was infiltrated into the left infraclavicular region.  A 7-cm incision was carried out over this region and electrocautery was utilized to dissect down to the fascial plane.  The left subclavian was punctured x2 and the St. Jude model 7122 65-cm lead, serial number [id number] was advanced into the right ventricle and the St. Jude model 2088T 52-cm active fixation pacing lead, serial number [id number] was advanced into the right atrium.  Mapping was carried out in the right ventricle.  At the final site, the R-wave was measured 8 mV, the pacing impedance was 470 ohms, and the threshold was 1.1 V at 0.5 msec.  A 10-V pacing did not stimulate the diaphragm and there was a large injury current with active fixation lead.  With these satisfactory parameters, attention was then turned to placement of  the atrial lead, which was placed in the anterolateral portion of the right atrium where P-waves were 2 mV, the pacing impedance was 400 ohms, and the threshold was initially 1.7 V at 0.5 msec.  A 10-V pacing did not stimulate the diaphragm.  With these satisfactory parameters, the leads were secured to the subpectoral fascia with a figure-of-eight silk suture.  The sewing sleeve was secured with silk suture.  Electrocautery was utilized to make a subcutaneous pocket.  Antibiotic irrigation was utilized to irrigate the pocket, and electrocautery was utilized to assure hemostasis.  The St. Jude Ellipse DR dual-chamber ICD, serial number [id number] was connected to the atrial and the RV leads and placed back in the subcutaneous pocket.  The pocket was irrigated with antibiotic irrigation.  The incision was closed with 2-0 and 3-0 Vicryl.  At this point, I scrubbed out the case to supervise defibrillation threshold testing.  After the patient was more deeply sedated with fentanyl and Versed, VF was induced with a T-wave shock.  Initially, a 15-joule shock failed to terminate VF, a 25-joule shock resulted in a dirty break.  Five minutes was allowed to lapse and a second defibrillation threshold test carried out.  At this time, VF was induced with a T-wave shock and a 28-joule  shock was delivered terminating ventricular ablation and restoring sinus rhythm.  At this point, no additional defibrillation threshold testing was carried out and the incision was closed with 2-0 and 3-0 Vicryl. Benzoin and Steri-Strips were painted on the skin, pressure dressing was applied, and the patient was returned to his room in satisfactory condition.  COMPLICATIONS:  There were no immediate procedure complications.  RESULTS:  This demonstrates successful implantation of a St. Jude dual- chamber ICD in a patient with an ischemic cardiomyopathy, status post aortic valve replacement, with recurrent ventricular  fibrillation.     Doylene Canning. Ladona Ridgel, MD     GWT/MEDQ  D:  09/06/2011  T:  09/07/2011  Job:  [id number]

## 2011-09-07 NOTE — Progress Notes (Addendum)
1 Day Post-Op Procedure(s) (LRB): IMPLANTABLE CARDIOVERTER DEFIBRILLATOR IMPLANT (N/A)  Subjective: Patient's only complaint is not sleeping. Objective: Vital signs in last 24 hours: Patient Vitals for the past 24 hrs:  BP Temp Temp src Pulse Resp SpO2 Weight  09/07/11 0724 - - - - - 95 % -  09/07/11 0700 99/52 mmHg - - 78  21  95 % -  09/07/11 0500 117/61 mmHg - - 77  23  93 % 180 lb 1.9 oz (81.7 kg)  09/07/11 0300 85/66 mmHg - - 77  23  90 % -  09/07/11 0200 104/59 mmHg - - 76  25  93 % -  09/07/11 0100 104/58 mmHg - - 75  19  94 % -  09/07/11 0000 100/52 mmHg 97.6 F (36.4 C) Oral 75  24  93 % -  09/06/11 2300 104/54 mmHg - - 75  23  96 % -  09/06/11 2200 89/72 mmHg - - 82  20  95 % -  09/06/11 2114 - - - - - 94 % -  09/06/11 2100 110/53 mmHg - - 82  22  97 % -  09/06/11 2000 95/52 mmHg - - 82  26  97 % -  09/06/11 1949 - 97.4 F (36.3 C) Oral - - - -  09/06/11 1900 104/59 mmHg - - 79  26  96 % -  09/06/11 1800 100/54 mmHg - - 76  23  98 % -  09/06/11 1700 95/49 mmHg - - 73  22  98 % -  09/06/11 1600 94/54 mmHg - - 72  19  95 % -  09/06/11 1518 - 98 F (36.7 C) Oral - - - -  09/06/11 1500 100/48 mmHg - - 67  25  91 % -  09/06/11 1433 - - - - - 93 % -  09/06/11 1400 104/58 mmHg - - 71  16  99 % -  09/06/11 1300 101/74 mmHg - - 68  17  94 % -  09/06/11 1200 102/53 mmHg - - 88  20  95 % -  09/06/11 1159 - 97.3 F (36.3 C) Oral - - - -  09/06/11 4540 - - - 77  - - -  09/06/11 0800 105/54 mmHg - - 74  19  96 % -   Pre op weight  81.4kg Current Weight  09/07/11 180 lb 1.9 oz (81.7 kg)      Intake/Output from previous day: 08/13 0701 - 08/14 0700 In: 568 [P.O.:60; IV Piggyback:508] Out: 3425 [Urine:3425]   Physical Exam:  Cardiovascular: RRR;rub; no murmurs, gallops. Pulmonary: Slightly diminished at the bases; no rales, wheezes, or rhonchi. Abdomen: Soft, non tender, bowel sounds present. Extremities: Mild bilateral lower extremity edema.Mid feet and toes with  erythema. Wounds: Clean and dry.  No erythema or signs of infection. Sternum is stable.  Lab Results: CBC:  Basename 09/07/11 0425 09/06/11 0445  WBC 11.9* 13.2*  HGB 9.1* 8.9*  HCT 28.0* 28.1*  PLT 379 428*   BMET:   Basename 09/06/11 0445 09/05/11 0419  NA 138 134*  K 4.3 3.1*  CL 95* 92*  CO2 37* 36*  GLUCOSE 155* 107*  BUN 23 24*  CREATININE 1.02 0.87  CALCIUM 8.4 8.7    PT/INR:  Lab Results  Component Value Date   INR 1.89* 08/25/2011   ABG:  INR: Will add last result for INR, ABG once components are confirmed Will add last 4 CBG results once components are  confirmed  Assessment/Plan:  1. CV - Previous afib and runs of NSVT then Vfib arrest 8/9. ROSC after first defib. S/p AICD 8/13.Maintaining SR this am.Continue Amiodarone 400 bid, and Lopressor 50 bid. 2.  Pulmonary - Encourage incentive spirometer. 3.Volume Overloaded-On Lasix 40 IV bid.Will give once today.Has diuresed well over the last couple of days.Will change to po bid in am. 4.  Acute blood loss anemia - H and H stable this am at 9.1 and 28.Continue Nu iron and folic acid. 5.Remove EPW. 6.Benadryl PRN sleep. 7.Transfer to PCTU  ZIMMERMAN,DONIELLE MPA-C  09/07/2011 7:47 AM  Stable after aicd, with addition of aldactone has been less edema  I have seen and examined Gabriel Marsh and agree with the above assessment  and plan.  Delight Ovens MD Beeper [phone] Office [phone] 09/07/2011 8:28 AM

## 2011-09-08 ENCOUNTER — Inpatient Hospital Stay (HOSPITAL_COMMUNITY): Payer: Medicare Other

## 2011-09-08 ENCOUNTER — Inpatient Hospital Stay (HOSPITAL_COMMUNITY)
Admission: RE | Admit: 2011-09-08 | Discharge: 2011-09-16 | DRG: 945 | Disposition: A | Discharge status: Home Health | Payer: Medicare Other | Source: Ambulatory Visit | Attending: Physical Medicine & Rehabilitation | Admitting: Physical Medicine & Rehabilitation

## 2011-09-08 ENCOUNTER — Encounter: Payer: Self-pay | Admitting: *Deleted

## 2011-09-08 DIAGNOSIS — Z951 Presence of aortocoronary bypass graft: Secondary | ICD-10-CM

## 2011-09-08 DIAGNOSIS — Z954 Presence of other heart-valve replacement: Secondary | ICD-10-CM

## 2011-09-08 DIAGNOSIS — K5900 Constipation, unspecified: Secondary | ICD-10-CM | POA: Diagnosis present

## 2011-09-08 DIAGNOSIS — Z5189 Encounter for other specified aftercare: Principal | ICD-10-CM

## 2011-09-08 DIAGNOSIS — E785 Hyperlipidemia, unspecified: Secondary | ICD-10-CM | POA: Diagnosis present

## 2011-09-08 DIAGNOSIS — I5022 Chronic systolic (congestive) heart failure: Secondary | ICD-10-CM | POA: Diagnosis present

## 2011-09-08 DIAGNOSIS — I359 Nonrheumatic aortic valve disorder, unspecified: Secondary | ICD-10-CM | POA: Diagnosis present

## 2011-09-08 DIAGNOSIS — I509 Heart failure, unspecified: Secondary | ICD-10-CM | POA: Diagnosis present

## 2011-09-08 DIAGNOSIS — Z95810 Presence of automatic (implantable) cardiac defibrillator: Secondary | ICD-10-CM | POA: Insufficient documentation

## 2011-09-08 DIAGNOSIS — I10 Essential (primary) hypertension: Secondary | ICD-10-CM | POA: Diagnosis present

## 2011-09-08 DIAGNOSIS — R5381 Other malaise: Secondary | ICD-10-CM

## 2011-09-08 DIAGNOSIS — G4700 Insomnia, unspecified: Secondary | ICD-10-CM | POA: Diagnosis present

## 2011-09-08 DIAGNOSIS — D649 Anemia, unspecified: Secondary | ICD-10-CM | POA: Diagnosis present

## 2011-09-08 DIAGNOSIS — I2510 Atherosclerotic heart disease of native coronary artery without angina pectoris: Secondary | ICD-10-CM | POA: Diagnosis present

## 2011-09-08 DIAGNOSIS — Z7982 Long term (current) use of aspirin: Secondary | ICD-10-CM

## 2011-09-08 DIAGNOSIS — Z87891 Personal history of nicotine dependence: Secondary | ICD-10-CM

## 2011-09-08 LAB — BASIC METABOLIC PANEL
CO2: 34 mEq/L — ABNORMAL HIGH (ref 19–32)
Calcium: 8.8 mg/dL (ref 8.4–10.5)
Chloride: 96 mEq/L (ref 96–112)
Sodium: 136 mEq/L (ref 135–145)

## 2011-09-08 MED ORDER — ONDANSETRON HCL 4 MG PO TABS: 4.0000 mg | ORAL_TABLET | Freq: Four times a day (QID) | ORAL | Status: DC | PRN

## 2011-09-08 MED ORDER — ASPIRIN EC 325 MG PO TBEC
325.0000 mg | DELAYED_RELEASE_TABLET | Freq: Every day | ORAL | Status: DC
  Administered 2011-09-09 – 2011-09-16 (×8): 325 mg via ORAL
  Filled 2011-09-08 (×9): qty 1

## 2011-09-08 MED ORDER — GUAIFENESIN ER 600 MG PO TB12
600.0000 mg | ORAL_TABLET | Freq: Two times a day (BID) | ORAL | Status: DC | PRN
  Filled 2011-09-08: qty 1

## 2011-09-08 MED ORDER — POLYSACCHARIDE IRON COMPLEX 150 MG PO CAPS
150.0000 mg | ORAL_CAPSULE | Freq: Every day | ORAL | Status: DC
  Administered 2011-09-09 – 2011-09-15 (×7): 150 mg via ORAL
  Filled 2011-09-08 (×9): qty 1

## 2011-09-08 MED ORDER — GUAIFENESIN ER 600 MG PO TB12: 600.0000 mg | ORAL_TABLET | Freq: Two times a day (BID) | ORAL | Status: DC | PRN

## 2011-09-08 MED ORDER — POLYSACCHARIDE IRON COMPLEX 150 MG PO CAPS: 150.0000 mg | ORAL_CAPSULE | Freq: Every day | ORAL | Status: DC

## 2011-09-08 MED ORDER — ACETAMINOPHEN 325 MG PO TABS: 325.0000 mg | ORAL_TABLET | ORAL | Status: DC | PRN

## 2011-09-08 MED ORDER — TRAMADOL HCL 50 MG PO TABS
50.0000 mg | ORAL_TABLET | Freq: Four times a day (QID) | ORAL | Status: DC | PRN
  Administered 2011-09-09 – 2011-09-10 (×2): 50 mg via ORAL
  Filled 2011-09-08 (×3): qty 1

## 2011-09-08 MED ORDER — SIMVASTATIN 5 MG PO TABS
5.0000 mg | ORAL_TABLET | Freq: Every day | ORAL | Status: DC
  Administered 2011-09-08 – 2011-09-15 (×7): 5 mg via ORAL
  Filled 2011-09-08 (×9): qty 1

## 2011-09-08 MED ORDER — SORBITOL 70 % SOLN: 30.0000 mL | Freq: Every day | Status: DC | PRN

## 2011-09-08 MED ORDER — SENNOSIDES-DOCUSATE SODIUM 8.6-50 MG PO TABS: 1.0000 | ORAL_TABLET | Freq: Every evening | ORAL | Status: DC | PRN

## 2011-09-08 MED ORDER — LEVALBUTEROL HCL 0.63 MG/3ML IN NEBU
0.6300 mg | INHALATION_SOLUTION | RESPIRATORY_TRACT | Status: DC | PRN
  Filled 2011-09-08: qty 3

## 2011-09-08 MED ORDER — METOPROLOL TARTRATE 50 MG PO TABS: 50.0000 mg | ORAL_TABLET | Freq: Two times a day (BID) | ORAL | Status: DC

## 2011-09-08 MED ORDER — SPIRONOLACTONE 25 MG PO TABS: 25.0000 mg | ORAL_TABLET | Freq: Every day | ORAL | Status: DC

## 2011-09-08 MED ORDER — TRAMADOL HCL 50 MG PO TABS: 50.0000 mg | ORAL_TABLET | Freq: Four times a day (QID) | ORAL | Status: DC | PRN

## 2011-09-08 MED ORDER — PANTOPRAZOLE SODIUM 40 MG PO TBEC
40.0000 mg | DELAYED_RELEASE_TABLET | Freq: Every day | ORAL | Status: DC
  Administered 2011-09-09 – 2011-09-16 (×8): 40 mg via ORAL
  Filled 2011-09-08 (×9): qty 1

## 2011-09-08 MED ORDER — FUROSEMIDE 40 MG PO TABS: 40.0000 mg | ORAL_TABLET | Freq: Two times a day (BID) | ORAL | Status: DC

## 2011-09-08 MED ORDER — FOLIC ACID 1 MG PO TABS
1.0000 mg | ORAL_TABLET | Freq: Every day | ORAL | Status: DC
  Administered 2011-09-09 – 2011-09-16 (×8): 1 mg via ORAL
  Filled 2011-09-08 (×9): qty 1

## 2011-09-08 MED ORDER — FOLIC ACID 1 MG PO TABS: 1.0000 mg | ORAL_TABLET | Freq: Every day | ORAL | Status: DC

## 2011-09-08 MED ORDER — ENOXAPARIN SODIUM 30 MG/0.3ML ~~LOC~~ SOLN
30.0000 mg | SUBCUTANEOUS | Status: DC
  Administered 2011-09-09 – 2011-09-16 (×8): 30 mg via SUBCUTANEOUS
  Filled 2011-09-08 (×9): qty 0.3

## 2011-09-08 MED ORDER — ONDANSETRON HCL 4 MG/2ML IJ SOLN: 4.0000 mg | Freq: Four times a day (QID) | INTRAMUSCULAR | Status: DC | PRN

## 2011-09-08 MED ORDER — LIDOCAINE VISCOUS 2 % MT SOLN
20.0000 mL | OROMUCOSAL | Status: DC | PRN
Start: 2011-09-08 — End: 2011-09-08
  Filled 2011-09-08: qty 20

## 2011-09-08 MED ORDER — ENSURE PUDDING PO PUDG
1.0000 | Freq: Three times a day (TID) | ORAL | Status: DC
  Administered 2011-09-08 – 2011-09-15 (×21): 1 via ORAL

## 2011-09-08 MED ORDER — SPIRONOLACTONE 25 MG PO TABS
25.0000 mg | ORAL_TABLET | Freq: Every day | ORAL | Status: DC
  Administered 2011-09-09 – 2011-09-16 (×8): 25 mg via ORAL
  Filled 2011-09-08 (×9): qty 1

## 2011-09-08 MED ORDER — ENSURE PUDDING PO PUDG: 1.0000 | Freq: Three times a day (TID) | ORAL | Status: DC

## 2011-09-08 MED ORDER — LIDOCAINE VISCOUS 2 % MT SOLN
20.0000 mL | OROMUCOSAL | Status: DC | PRN
  Administered 2011-09-08: 20 mL via OROMUCOSAL
  Filled 2011-09-08: qty 30

## 2011-09-08 MED ORDER — AMIODARONE HCL 200 MG PO TABS
400.0000 mg | ORAL_TABLET | Freq: Every day | ORAL | Status: DC
  Administered 2011-09-09 – 2011-09-16 (×8): 400 mg via ORAL
  Filled 2011-09-08 (×10): qty 2

## 2011-09-08 MED ORDER — LIDOCAINE VISCOUS 2 % MT SOLN
20.0000 mL | OROMUCOSAL | Status: DC | PRN
  Administered 2011-09-08: 20 mL via OROMUCOSAL
  Filled 2011-09-08: qty 20

## 2011-09-08 MED ORDER — SODIUM CHLORIDE 0.9 % IJ SOLN
10.0000 mL | INTRAMUSCULAR | Status: DC | PRN
  Administered 2011-09-08: 20 mL

## 2011-09-08 MED ORDER — AMIODARONE HCL 400 MG PO TABS: 400.0000 mg | ORAL_TABLET | Freq: Every day | ORAL | Status: DC

## 2011-09-08 MED ORDER — ASPIRIN 325 MG PO TBEC: 325.0000 mg | DELAYED_RELEASE_TABLET | Freq: Every day | ORAL | Status: AC

## 2011-09-08 MED ORDER — METOPROLOL TARTRATE 50 MG PO TABS
50.0000 mg | ORAL_TABLET | Freq: Two times a day (BID) | ORAL | Status: DC
  Administered 2011-09-08 – 2011-09-10 (×3): 50 mg via ORAL
  Filled 2011-09-08 (×6): qty 1

## 2011-09-08 NOTE — H&P (Signed)
Physical Medicine and Rehabilitation Admission H&P    No chief complaint on file. : HPI: Gabriel Marsh is a 78 y.o. right-handed male with history of coronary artery disease and severe aortic stenosis as well as congestive heart failure. Admitted 08/23/2011 with pulmonary edema and acute onset of systolic dysfunction. Cardiac enzymes were positive. Patient with nonspecific chest pain to the right shoulder. Echocardiogram revealed critical aortic stenosis with severe left ventricular dysfunction and ejection fraction of 25%. A cardiac catheterization was completed revealing severe three-vessel disease. Underwent coronary artery bypass grafting x3 with right leg endo vein harvesting and aortic valve replacement 08/29/2011 per Dr. Tyrone Sage. Patient placed on aspirin therapy as well as subcutaneous Lovenox. Postoperative atrial fibrillation maintained on amiodarone 400 mg daily as well as Lopressor. Plan ICD placement per cardiology services and procedure completed 09/06/2011 without postoperative complications. Physical therapy ongoing noted deconditioning and still needing oxygen therapy during activities. M.D. is requested physical medicine rehabilitation consult to consider inpatient rehabilitation services. Patient was felt to be a candidate for inpatient rehabilitation services and was admitted for comprehensive rehabilitation program Patient states he slept better last night Review of Systems  Cardiovascular: Positive for palpitations and leg swelling.  Gastrointestinal: Positive for constipation.  Neurological: Positive for weakness.  All other systems reviewed and are negative   Past Medical History  Diagnosis Date  . Hypertension   . Hyperlipidemia   . Mitral regurgitation     Moderate by 08/20/11 echo  . Shortness of breath   . CAD (coronary artery disease)     a) 08/22/11 cath :  50-60% left main stenosis, mild-moderate LAD disease, 99% ostial large diagonal stenosis, occluded and  collateralized LCx and RCA  . Aortic stenosis     Severe by 08/20/11 echo  . Aortic insufficiency     Moderate by 08/20/11 echo  . Chronic systolic CHF (congestive heart failure)     a) 08/20/11 echo : LVEF 35-40%   Past Surgical History  Procedure Date  . Eye surgery   . Cardiac catheterization 08/22/11     50-60% left main stenosis, mild-moderate LAD disease, 99% ostial large diagonal stenosis, occluded and collateralized LCx and RCA  . Coronary artery bypass graft 08/25/2011    Procedure: CORONARY ARTERY BYPASS GRAFTING (CABG);  Surgeon: Delight Ovens, MD;  Location: [facility] OR;  Service: Open Heart Surgery;  Laterality: N/A;  Hot and humid all through surgery.  . Aortic valve replacement 08/25/2011    Procedure: AORTIC VALVE REPLACEMENT (AVR);  Surgeon: Delight Ovens, MD;  Location: [facility] OR;  Service: Open Heart Surgery;  Laterality: N/A;  hot and humid all through surgery.   Family History  Problem Relation Age of Onset  . Heart disease Brother    Social History:  reports that he quit smoking about 16 years ago. He does not have any smokeless tobacco history on file. He reports that he does not drink alcohol or use illicit drugs. Allergies:  Allergies  Allergen Reactions  . Fish Allergy   . Lisinopril     cough  . Penicillins     Hives   . Statins     Leg cramps  . Sulfa Antibiotics     Hives    Medications Prior to Admission  Medication Sig Dispense Refill  . amiodarone (PACERONE) 400 MG tablet Take 1 tablet (400 mg total) by mouth daily.  30 tablet  1  . aspirin EC 325 MG EC tablet Take 1 tablet (325 mg total) by mouth daily.  30 tablet    . feeding supplement (ENSURE) PUDG Take 1 Container by mouth 3 (three) times daily between meals.      . folic acid (FOLVITE) 1 MG tablet Take 1 tablet (1 mg total) by mouth daily.  30 tablet  1  . furosemide (LASIX) 40 MG tablet Take 1 tablet (40 mg total) by mouth 2 (two) times daily. For 7 days  14 tablet  0  . guaiFENesin  (MUCINEX) 600 MG 12 hr tablet Take 1 tablet (600 mg total) by mouth 2 (two) times daily as needed.      . iron polysaccharides (NIFEREX) 150 MG capsule Take 1 capsule (150 mg total) by mouth daily.  30 capsule  1  . metoprolol (LOPRESSOR) 50 MG tablet Take 1 tablet (50 mg total) by mouth 2 (two) times daily.  60 tablet  1  . spironolactone (ALDACTONE) 25 MG tablet Take 1 tablet (25 mg total) by mouth daily. For 7 days  7 tablet  0  . traMADol (ULTRAM) 50 MG tablet Take 1 tablet (50 mg total) by mouth every 6 (six) hours as needed.  30 tablet      Home:     Functional History:    Functional Status:  Mobility:          ADL:    Cognition:       There were no vitals taken for this visit. Physical Exam  Constitutional: He is oriented to person, place, and time. He appears well-developed.  HENT:  Head: Normocephalic.  Eyes:  Pupils round and reactive to light  Neck: Neck supple. No thyromegaly present.  Cardiovascular:  Cardiac rate controlled  Pulmonary/Chest: Breath sounds normal. He has no wheezes.  Abdominal: Bowel sounds are normal. He exhibits no distension. There is no tenderness.  Musculoskeletal: He exhibits no edema.  Neurological: He is alert and oriented to person, place, and time.  Skin: edema in both feet up to the midcalf area 2+. Chest incision well approximated clean and dry .ICD placement dressing clean and dry  Psychiatric: He has a normal mood and affect.  motor strength is 5/5 in the right deltoid, biceps, triceps, grip.  4/5 in bilateral hip flexors knee extensors and ankle dorsiflexors and plantar flexors  Left upper extremity has good grip strength however remainder of muscle testing was not performed secondary to restrictions from recent surgery  Sensation is intact to light touch in both upper and lower extremities   Results for orders placed during the hospital encounter of 08/23/11 (from the past 48 hour(s))  GLUCOSE, CAPILLARY     Status:  Abnormal   Collection Time   09/06/11 10:00 PM      Component Value Range Comment   Glucose-Capillary 104 (*) 70 - 99 mg/dL    Comment 1 Documented in Chart      Comment 2 Notify RN     CBC     Status: Abnormal   Collection Time   09/07/11  4:25 AM      Component Value Range Comment   WBC 11.9 (*) 4.0 - 10.5 K/uL    RBC 3.10 (*) 4.22 - 5.81 MIL/uL    Hemoglobin 9.1 (*) 13.0 - 17.0 g/dL    HCT 16.1 (*) 09.6 - 52.0 %    MCV 90.3  78.0 - 100.0 fL    MCH 29.4  26.0 - 34.0 pg    MCHC 32.5  30.0 - 36.0 g/dL    RDW 04.5 (*) 40.9 - 15.5 %  Platelets 379  150 - 400 K/uL   GLUCOSE, CAPILLARY     Status: Abnormal   Collection Time   09/07/11  7:49 AM      Component Value Range Comment   Glucose-Capillary 125 (*) 70 - 99 mg/dL   BASIC METABOLIC PANEL     Status: Abnormal   Collection Time   09/08/11  6:30 AM      Component Value Range Comment   Sodium 136  135 - 145 mEq/L    Potassium 3.8  3.5 - 5.1 mEq/L    Chloride 96  96 - 112 mEq/L    CO2 34 (*) 19 - 32 mEq/L    Glucose, Bld 93  70 - 99 mg/dL    BUN 22  6 - 23 mg/dL    Creatinine, Ser 1.61  0.50 - 1.35 mg/dL    Calcium 8.8  8.4 - 09.6 mg/dL    GFR calc non Af Amer 78 (*) >90 mL/min    GFR calc Af Amer >90  >90 mL/min    Dg Chest 2 View  09/08/2011  *RADIOLOGY REPORT*  Clinical Data: Short of breath.  Aortic valve replacement and pacemaker placement.  CHEST - 2 VIEW  Comparison: 09/07/2011.  Findings: Mild CHF persists.  CABG/median sternotomy with aortic valve replacement.  Dual lead left subclavian cardiac pacemaker appears similar to prior exam.  Bilateral basilar atelectasis, interstitial and mild alveolar pulmonary edema.  Dependently layering bilateral small pleural effusions with associated compressive atelectasis.  The compared yesterday's exam, the pleural effusions appear slightly smaller although overall aeration is little changed. Right upper extremity PICC appears similar.  IMPRESSION: Little interval change. Minimal  decrease in pleural effusions and compressive atelectasis.  Original Report Authenticated By: Andreas Newport, M.D.   Dg Chest 2 View  09/07/2011  *RADIOLOGY REPORT*  Clinical Data: Status post pacer insertion, weakness  CHEST - 2 VIEW  Comparison: Portable chest x-ray of 09/05/2011  Findings: A dual lead permanent pacemaker is now present.  No pneumothorax is seen.  There has been some improvement in the edema pattern with mild congestion and probable small effusions remaining.  Cardiomegaly is stable.  Right PICC line is unchanged in position.  IMPRESSION: Permanent pacemaker now present.  Improvement in edema pattern with mild congestion and small effusions remaining.  Original Report Authenticated By: Juline Patch, M.D.    Post Admission Physician Evaluation: 1. Functional deficits secondary  to deconditioning after coronary artery bypass grafting as well as aortic valve replacement 08/29/2011. Patient also had implantable defibrillator 09/06/2011. 2. Patient is admitted to receive collaborative, interdisciplinary care between the physiatrist, rehab nursing staff, and therapy team. 3. Patient's level of medical complexity and substantial therapy needs in context of that medical necessity cannot be provided at a lesser intensity of care such as a SNF. 4. Patient has experienced substantial functional loss from his/her baseline which was documented above under the "Functional History" and "Functional Status" headings.  Judging by the patient's diagnosis, physical exam, and functional history, the patient has potential for functional progress which will result in measurable gains while on inpatient rehab.  These gains will be of substantial and practical use upon discharge  in facilitating mobility and self-care at the household level. 5. Physiatrist will provide 24 hour management of medical needs as well as oversight of the therapy plan/treatment and provide guidance as appropriate regarding the  interaction of the two. 6. 24 hour rehab nursing will assist with bladder management, bowel management, safety, skin/wound  care, disease management, medication administration, pain management and patient education  and help integrate therapy concepts, techniques,education, etc. 7. PT will assess and treat for:  Pre-gait training, gait training, endurance, safety, equipment.  Goals are: supervision to modified independent with all mobility. 8. OT will assess and treat for: ADLs, cognitive perceptual about, safety endurance equipment.   Goals are: supervision with all ADLs. 9. SLP will assess and treat for: not applicable.  Goals are: not applicable. 10. Case Management and Social Worker will assess and treat for psychological issues and discharge planning. 11. Team conference will be held weekly to assess progress toward goals and to determine barriers to discharge. 12. Patient will receive at least 3 hours of therapy per day at least 5 days per week. 13. ELOS: 7-10 day      Prognosis:  excellent   Medical Problem List and Plan: 1. deconditioning after CABG/implantable cardiac defibrillator 2. DVT Prophylaxis/Anticoagulation: Subcutaneous Lovenox. Monitor platelet counts any signs of bleeding 3. Pain Management: Ultram as needed. Monitor with increased mobility 4. Neuropsych: This patient is capable of making decisions on his/her own behalf. 5. Hypertension/recurrent malignant ventricular arrhythmias. Amiodarone 400 mg daily, Lopressor 50 mg twice a day, Aldactone 25 mg daily. Monitor with increased mobility 6. Hyperlipidemia. Zocor 7. Anemia. Continue Niferex. Followup CBC 8. Chronic systolic congestive heart failure. Monitor for any signs of fluid overload  09/08/2011, 3:30 PM

## 2011-09-08 NOTE — Plan of Care (Signed)
Problem: Phase III Progression Outcomes Goal: Time patient transferred to PCTU/Telemetry POD Outcome: Completed/Met Date Met:  09/08/11 POD 7 at 1800 PM

## 2011-09-08 NOTE — Progress Notes (Signed)
Chest tube sutures removed intact per order. Suture sites well approximated with no drainage or s/s of infection. Cleaned sites with tincture benzoin and steri-strips applied. Gabriel Marsh

## 2011-09-08 NOTE — Progress Notes (Signed)
[id number] Noted that pt going to INPT REHAB today. Education for after d/c from hospital on activity, sternal prec, diet with pt and sister. Discussed that pt will need to go at his pace. Discussed CRP 2 for future goal. Pt to discuss with cardiologist in [place] when he feels he is ready to pursue this. Kenlee Maler DunlapRN

## 2011-09-08 NOTE — Plan of Care (Signed)
Problem: Phase III Progression Outcomes Goal: Transfer to PCTU/Telemetry POD Outcome: Completed/Met Date Met:  09/08/11 POD 7

## 2011-09-08 NOTE — Plan of Care (Signed)
Overall Plan of Care [facility]) Patient Details Name: Gabriel Marsh MRN: [id number] DOB: 07-12-1930  Diagnosis:  Rehabilitation for deconditioning  Primary Diagnosis:    Physical deconditioning Co-morbidities: aortic valve stenosis status post replacement, coronary artery disease status post CABG, ventricular arrhythmia status post ICD  Functional Problem List  Patient demonstrates impairments in the following areas: Balance, Edema, Endurance, Motor and Safety  Basic ADL's: grooming, bathing, dressing, toileting and toilet and shower transfers Advanced ADL's: simple meal preparation  Transfers:  bed mobility, bed to chair, toilet, tub/shower, car and furniture Locomotion:  ambulation and stairs  Additional Impairments:  None  Anticipated Outcomes Item Anticipated Outcome  Eating/Swallowing    Basic self-care  Mod I  Tolieting  Mod I  Bowel/Bladder  Mod I  Transfers  Mod I  Locomotion  Mod I  Communication    Cognition    Pain  <3  Safety/Judgment    Other     Therapy Plan: PT Frequency: 1-2 X/day, 60-90 minutes OT Frequency: 1-2 X/day, 60-90 minutes     Team Interventions: Item RN PT OT SLP SW TR Other  Self Care/Advanced ADL Retraining   x      Neuromuscular Re-Education  x x      Therapeutic Activities  x x      UE/LE Strength Training/ROM  x x      UE/LE Coordination Activities  x x      Visual/Perceptual Remediation/Compensation         DME/Adaptive Equipment Instruction  x x      Therapeutic Exercise  x x      Balance/Vestibular Training  x x      Patient/Family Education  x x      Cognitive Remediation/Compensation         Functional Mobility Training  x x      Ambulation/Gait Training  x       Museum/gallery curator  x       Wheelchair Propulsion/Positioning  x x      Functional Tourist information centre manager Reintegration  x x      Dysphagia/Aspiration Film/video editor         Bladder Management x         Bowel Management x        Disease Management/Prevention x        Pain Management x x x      Medication Management x        Skin Care/Wound Management x        Splinting/Orthotics         Discharge Planning x x x  x    Psychosocial Support x    x                       Team Discharge Planning: Destination:  Home Projected Follow-up:  PT and outpatient, OT home Health vs outpatient Projected Equipment Needs:  Elevated Toilet Seat Patient/family involved in discharge planning:  Yes  MD ELOS: 7-10 days Medical Rehab Prognosis:  Good Assessment: 78 year old male admitted for aVR, also requiring CABG x3 as well as ICD placement. Patient is now severely deconditioned and requires CIR level PT, OT, 24 7 rehabilitation RN and M.D.

## 2011-09-08 NOTE — Progress Notes (Signed)
Pt. Admitted to [facility]009 rehab.  AOX3; VSS; no complaints of pain.  Wife at bedside. Pt. Oriented to rehab.

## 2011-09-08 NOTE — Progress Notes (Addendum)
2 Days Post-Op Procedure(s) (LRB): IMPLANTABLE CARDIOVERTER DEFIBRILLATOR IMPLANT (N/A)  Subjective:  Mr. Gabriel Marsh complains of a sore on his tongue.  He also has a dry cough.   Objective: Vital signs in last 24 hours: Temp:  [97.7 F (36.5 C)-98.1 F (36.7 C)] 98 F (36.7 C) (08/15 0419) Pulse Rate:  [74-88] 81  (08/15 0419) Cardiac Rhythm:  [-] Heart block (08/14 2000) Resp:  [15-26] 20  (08/15 0419) BP: (88-113)/(44-75) 104/65 mmHg (08/15 0419) SpO2:  [92 %-99 %] 94 % (08/15 0419) Weight:  [177 lb 0.5 oz (80.3 kg)] 177 lb 0.5 oz (80.3 kg) (08/15 0419)  Intake/Output from previous day: 08/14 0701 - 08/15 0700 In: 420 [P.O.:400; I.V.:20] Out: 2301 [Urine:2300; Stool:1]  General appearance: alert, cooperative and no distress Heart: regular rate and rhythm Lungs: clear to auscultation bilaterally Abdomen: soft, non-tender; bowel sounds normal; no masses,  no organomegaly Extremities: edema trace Wound: clean and dry  Lab Results:  Basename 09/07/11 0425 09/06/11 0445  WBC 11.9* 13.2*  HGB 9.1* 8.9*  HCT 28.0* 28.1*  PLT 379 428*   BMET:  Basename 09/08/11 0630 09/06/11 0445  NA 136 138  K 3.8 4.3  CL 96 95*  CO2 34* 37*  GLUCOSE 93 155*  BUN 22 23  CREATININE 0.92 1.02  CALCIUM 8.8 8.4    PT/INR: No results found for this basename: LABPROT,INR in the last 72 hours ABG    Component Value Date/Time   PHART 7.397 09/01/2011 0323   HCO3 22.9 09/01/2011 0323   TCO2 24 09/01/2011 0323   ACIDBASEDEF 2.0 09/01/2011 0323   O2SAT 100.0 09/01/2011 0323   CBG (last 3)   Basename 09/07/11 0749 09/06/11 2200 09/06/11 1518  GLUCAP 125* 104* 91    Assessment/Plan: S/P Procedure(s) (LRB): IMPLANTABLE CARDIOVERTER DEFIBRILLATOR IMPLANT (N/A)  1. CV- NSR previous A. Fib, previous V. Fib arrest S/P AICD.  NSR currently on Amiodarone and Lopressor 2. Pulm- encourage incentive spirometer 3. Volume Overload- will place of oral Lasix for d/c 4. Apthous Ulcer- order viscous  lidocaine for relief 5. Deconditioning- patient needs rehab 6. Dispo- patient progressing well, will d/c to rehab   LOS: 16 days    Lowella Dandy 09/08/2011   To rehab soon, maybe today if bed ready I have seen and examined [name] and agree with the above assessment  and plan.  Delight Ovens MD Beeper [phone] Office [phone] 09/08/2011 9:16 AM

## 2011-09-08 NOTE — PMR Pre-admission (Signed)
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 Date: 04-24-97     Deduct: $1184      Out of Pocket Max: none      Life Max: unlimited CIR: 100%      SNF: 100 days Outpatient: 80%     Co-Pay: 20% Home Health: 100%      Co-Pay: none DME: 80%     Co-Pay: 20% Providers: Patient's choice  SECONDARY: BCBS      Policy#: [id number]      Subscriber: self  Emergency Contact Information Contact Information    Name Relation Home Work Mobile   Crawford,Milred Niece [phone]  [phone]   Jasmine Awe [phone]       Current Medical History  Patient Admitting Diagnosis: deconditioning after coronary artery bypass grafting as well as implantable cardiac defibrillator placement  History of Present Illness:78 y.o. right-handed male with history of coronary artery disease and severe aortic stenosis as well as congestive heart failure. Admitted 08/23/2011 with pulmonary edema and acute onset of systolic dysfunction. Cardiac enzymes were positive. Patient with nonspecific chest pain to the right shoulder. Echocardiogram revealed critical aortic stenosis with severe left ventricular dysfunction and ejection fraction of 25%. A cardiac catheterization was completed revealing severe three-vessel disease. Underwent coronary artery bypass grafting x3 with right leg endo vein harvesting and aortic valve replacement 08/29/2011 per Dr. Tyrone Sage. Patient placed on aspirin therapy as well as subcutaneous Lovenox. Postoperative atrial fibrillation maintained on amiodarone as well as Lopressor. Plan ICD placement per cardiology services 09/06/2011.no postoperative complications. Physical therapy ongoing noted deconditioning and still needing oxygen therapy  during activities.      Past Medical History  Past Medical History  Diagnosis Date  . Hypertension   . Hyperlipidemia   . Mitral regurgitation     Moderate by 08/20/11 echo  . Shortness of breath   . CAD (coronary artery disease)     a) 08/22/11 cath :  50-60% left main stenosis, mild-moderate LAD disease, 99% ostial large diagonal stenosis, occluded and collateralized LCx and RCA  . Aortic stenosis     Severe by 08/20/11 echo  . Aortic insufficiency     Moderate by 08/20/11 echo  . Chronic systolic CHF (congestive heart failure)     a) 08/20/11 echo : LVEF 35-40%    Family History  family history includes Heart disease in his brother.  Prior Rehab/Hospitalizations: none   Current Medications  Current facility-administered medications:0.9 %  sodium chloride infusion, 250 mL, Intravenous, PRN, Ardelle Balls, PA;  acetaminophen (TYLENOL) tablet 650 mg, 650 mg, Oral, Q6H PRN, Ardelle Balls, PA;  amiodarone (PACERONE) tablet 400 mg, 400 mg, Oral, Daily, Marinus Maw, MD, 400 mg at 09/07/11 1000;  aspirin EC tablet 325 mg, 325 mg, Oral, Daily, Delight Ovens, MD, 325 mg at 09/07/11 1000 bisacodyl (DULCOLAX) EC tablet 10 mg, 10 mg, Oral, Daily PRN, Ardelle Balls, PA;  bisacodyl (DULCOLAX) suppository 10 mg, 10 mg, Rectal, Daily PRN, Ardelle Balls, PA;  diphenhydrAMINE (BENADRYL) capsule 25 mg, 25 mg, Oral, Q6H PRN, Delight Ovens, MD, 25 mg at 08/31/11 2002;  docusate sodium (COLACE) capsule 200 mg, 200 mg, Oral, Daily, Ardelle Balls, PA, 200 mg at 09/07/11  1000 enoxaparin (LOVENOX) injection 30 mg, 30 mg, Subcutaneous, Q24H, Delight Ovens, MD, 30 mg at 09/07/11 1000;  feeding supplement (ENSURE) pudding 1 Container, 1 Container, Oral, TID BM, Ailene Ards, RD, 1 Container at 09/07/11 1959;  folic acid (FOLVITE) tablet 1 mg, 1 mg, Oral, Daily, Ardelle Balls, PA, 1 mg at 09/07/11 1000;  furosemide (LASIX) tablet 40 mg, 40 mg, Oral,  BID, Ardelle Balls, PA, 40 mg at 09/08/11 0752 guaiFENesin (MUCINEX) 12 hr tablet 600 mg, 600 mg, Oral, BID PRN, Ardelle Balls, PA, 600 mg at 09/08/11 0110;  iron polysaccharides (NIFEREX) capsule 150 mg, 150 mg, Oral, Daily, Ardelle Balls, PA, 150 mg at 09/07/11 1000;  levalbuterol (XOPENEX) nebulizer solution 0.63 mg, 0.63 mg, Nebulization, Q3H PRN, Delight Ovens, MD;  metoprolol (LOPRESSOR) tablet 50 mg, 50 mg, Oral, BID, Brooke O Edmisten, PA-C, 50 mg at 09/07/11 1000 ondansetron (ZOFRAN) injection 4 mg, 4 mg, Intravenous, Q6H PRN, Marinus Maw, MD;  pantoprazole (PROTONIX) EC tablet 40 mg, 40 mg, Oral, QAC breakfast, Ardelle Balls, PA, 40 mg at 09/08/11 1610;  simvastatin (ZOCOR) tablet 5 mg, 5 mg, Oral, q1800, Roger A Arguello, PA-C, 5 mg at 09/07/11 1750;  sodium chloride 0.9 % injection 10-40 mL, 10-40 mL, Intracatheter, PRN, Delight Ovens, MD, 20 mL at 09/08/11 0603 sodium chloride 0.9 % injection 3 mL, 3 mL, Intravenous, Q12H, Ardelle Balls, PA, 3 mL at 09/07/11 1000;  sodium chloride 0.9 % injection 3 mL, 3 mL, Intravenous, PRN, Ardelle Balls, PA;  spironolactone (ALDACTONE) tablet 25 mg, 25 mg, Oral, Daily, Delight Ovens, MD, 25 mg at 09/07/11 1000;  traMADol (ULTRAM) tablet 50 mg, 50 mg, Oral, Q6H PRN, Kerin Perna, MD, 50 mg at 09/01/11 1450  Patients Current Diet: Cardiac  Precautions / Restrictions Precautions Precautions: ICD/Pacemaker;Fall;Sternal Precaution Comments: no longer needs O2 for gait.   Restrictions Weight Bearing Restrictions: No   Prior Activity Level Community (5-7x/wk): Daily outings Journalist, newspaper / Equipment Home Assistive Devices/Equipment: None Home Adaptive Equipment: Environmental consultant - rolling (Niece has one available)  Prior Functional Level Prior Function Level of Independence: Independent Able to Take Stairs?: Yes Driving: Yes Vocation: Retired  Current Functional Level Cognition   Arousal/Alertness: Awake/alert Overall Cognitive Status: Appears within functional limits for tasks assessed/performed Orientation Level: Oriented X4 Cognition - Other Comments: HOH, deaf in left ear    Extremity Assessment (includes Sensation/Coordination)     RLE ROM/Strength/Tone: Deficits RLE ROM/Strength/Tone Deficits: grossly 4/5    ADLs       Mobility  Bed Mobility: Rolling Left Rolling Left: 4: Min guard Left Sidelying to Sit: 4: Min guard Sitting - Scoot to [place] of Bed: 7: Independent Sit to Supine: 4: Min assist    Transfers  Transfers: Sit to Stand;Stand to Sit Sit to Stand: 4: Min assist;From bed Stand to Sit: 4: Min assist;To chair/3-in-1 Stand Pivot Transfers: 4: Min assist    Ambulation / Gait / Stairs / Psychologist, prison and probation services  Ambulation/Gait Ambulation/Gait Assistance: 4: Min Government social research officer (Feet): 150 Feet Assistive device: Rolling walker (pt steering with righ tarm PT steering with left arm) Ambulation/Gait Assistance Details: min guard assist for the occational small stagger while multi tasking.  Gait Pattern: Step-through pattern;Shuffle Gait velocity: less than 1.8 ft/sec indicating a risk for recurrent falls. General Gait Details: DOE increased to 3/4 and O2 sats dropped to 82 on RA.      Posture / Balance Dynamic Sitting Balance Dynamic  Sitting - Balance Support: No upper extremity supported;Feet supported;During functional activity (solid symmetrical scoot) Dynamic Sitting - Level of Assistance: 5: Stand by assistance Static Standing Balance Static Standing - Balance Support: Bilateral upper extremity supported Static Standing - Level of Assistance: 5: Stand by assistance     Previous Home Environment Living Arrangements: Alone Lives With: Alone (Will go to niece's house at [place]) Available Help at Discharge: Family;Available 24 hours/day (Niece) Type of Home: House Home Layout: One level (except 2 steps up to bedroom.) Home Access:  Stairs to enter Entrance Stairs-Rails: Right Entrance Stairs-Number of Steps: 10 Home Care Services: No  Discharge Living Setting Plans for Discharge Living Setting: Lives with (comment) (Will stay with niece ) Type of Home at Discharge: House Discharge Home Layout: One level (2 steps with rail up to level with bed & bath rooms) Discharge Home Access: Stairs to enter Entrance Stairs-Rails: Can reach both Entrance Stairs-Number of Steps:  (8 STE at main entrance / alternate entrance with 2 STE) Discharge Bathroom Accessibility: Yes Do you have any problems obtaining your medications?: No  Social/Family/Support Systems Patient Roles:Uncle Contact Information: Niece: [name] Anticipated Caregiver: Niece  Anticipated Caregiver's Contact Information: H: [phone], C: [phone] Ability/Limitations of Caregiver:  none Caregiver Availability: Niece works 2 days/week part-time. Other family will stay with pt. Discharge Plan Discussed with Primary Caregiver: Yes Is Caregiver In Agreement with Plan?: Yes Does Caregiver/Family have Issues with Lodging/Transportation while Pt is in Rehab?: No  Goals/Additional Needs Patient/Family Goal for Rehab:  (PT: S, OT S-Mod I) Expected length of stay: 7-10 Days Cultural Considerations: none Equipment Needs: TBD Pt/Family Agrees to Admission and willing to participate: Yes Program Orientation Provided & Reviewed with Pt/Caregiver Including Roles  & Responsibilities: Yes Additional Information Needs: Pt lives alone in [place] and plans to return when able.  Patient Condition: This patient's condition remains as documented in the Consult dated 09/06/11, in which the Rehabilitation Physician determined and documented that the patient's condition is appropriate for intensive rehabilitative care in an inpatient rehabilitation facility.  Preadmission Screen Completed By:  Meryl Dare, 09/08/2011 9:54  AM ______________________________________________________________________   Discussed status with Dr. Wynn Banker on 09/08/11 at 10:15AM and received telephone approval for admission today.  Admission Coordinator:  Meryl Dare, time 10:15 AM/Date 09/08/11

## 2011-09-08 NOTE — Discharge Summary (Signed)
Physician Discharge Summary  Patient ID: Gabriel Marsh MRN: [id number] DOB/AGE: 76-Oct-1932 76 y.o.  Admit date: 08/23/2011 Discharge date: 09/08/2011  Admission Diagnoses:  Patient Active Problem List  Diagnosis  . Benign hypertensive heart disease without heart failure  . Pure hypercholesterolemia  . Severe aortic stenosis  . CAD (coronary artery disease), native coronary artery  . Hypertension  . Hyperlipidemia  . Chronic systolic CHF (congestive heart failure)  . Moderate mitral regurgitation  . Moderate aortic insufficiency  . Cardiogenic shock  . NSTEMI (non-ST elevated myocardial infarction)  . Acute respiratory failure  . Torsades de pointes  . Atrial fibrillation  . Acute on chronic systolic heart failure  . ICD-St.Jude   Discharge Diagnoses:  Principal Problem:  *Severe aortic stenosis Active Problems:  CAD (coronary artery disease), native coronary artery  Hypertension  Hyperlipidemia  Chronic systolic CHF (congestive heart failure)  Moderate mitral regurgitation  Moderate aortic insufficiency  Cardiogenic shock  NSTEMI (non-ST elevated myocardial infarction)  Acute respiratory failure  Torsades de pointes  Atrial fibrillation  Acute on chronic systolic heart failure  Discharged Condition: good  History of Present Illness:   Gabriel Marsh is a 78 yo white male with PMH of severe Aortic Stenosis, CAD, CHF, hyperlipidemia, and hypertension.  He presented to an OSH with complaint of worsening shortness of breath and orthopnea that had progressed over the past 1-2 weeks.  Workup revealed the patient to be suffering from flash pulmonary edema secondary to acute CHF.  His troponin was mildly elevated and he was ruled in for a NSTEMI.  He underwent echocardiogram which revealed a reduced LV function of 35-40%, severe aortic stenosis, moderate aortic insufficiency and mitral regurgitation  He was also noted to have some inferior wall akinesis.  Due to these findings the  patient underwent cardiac catheterization which revealed multivessel CAD with LM involvement.  It was felt the patient would benefit from Aortic Valve Replacement and Coronary Bypass procedure.  However, the patient wished to be transferred to [facility] so he could be monitored by his primary cardiologist Dr. Patty Sermons.  He was transferred and accepted to cardiology service on 08/23/2011.  Hospital Course:   Upon arrival the patient was evaluated by Dr. Tyrone Sage for possible AVR and coronary bypass.  He felt the patient would require surgery and the risks and benefits were explained to the patient.  He was agreeable to proceed with surgery.  HD #1 patient suffered agonal cardiac arrest and hypotension.  He was placed on Dopamine. He developed sharp chest and shoulder pain.  He was taken to the cath lab for Intra aortic balloon pump placement.  His chest pain resolved.  HD #2 the patient was taken to the operating room.  He underwent Aortic Valve Replacement utilizing a 23mm Magna Pericardial Tissue Valve, CABG x3 LIMA to LAD, SVG to Ramus, and SVG to RCA.  He also underwent endoscopic saphenous vein harvest from right leg.  He tolerated the procedure well and was taken to the SICU in stable condition.  POD #0 patient remained intubated.  He required hemodynamic support with IABP, Dopamine and Neo.  POD #1 patient continued to require support with IABP and drips.  POD #2  The patient's balloon pump was removed without difficulty.  He remained on Neo and Dopamine.  Sputum culture obtained through ET tube for leukocytosis.  The patient was extubated.  POD #3 patient weaned off Neo remained on Dopamine.  POD #4 patient was weaned off Dopamine as tolerated.  Patient developed  atrial fibrillation with conversion to NSR.  He was placed on Amiodarone  POD #7 patient developed pulseless VT.  He underwent CPR with defibrillation with successful return of circulation.  EP consult was obtained who recommended adjustment of  antiarrythmic agents and would re-evaluate the need for AICD once patient was more medically optimized.  POD #10 EP evaluated the patient recommended continuation of antiarrythmic agents and implantation of AICD in a few days.  POD #11 patient received potassium supplementation for hypokalemia.  He was placed on Aldactone for persistent peripheral edema due to chronic heart failure.  He underwent PICC line placement.   EP weaned patient off IV Amiodarone and started and oral regimen.  POD #12 the patient underwent AICD placement.  He tolerated the procedure well.  POD #13 the patient's EPW were removed.  He was medically stable and transferred to the telemetry unit in stable condition.  POD #14 patient is doing well.  He remains in NSR, AICD has been interrogated and is functioning properly.  He will be discharged to a SNF today. He will need to follow- up with Dr. Tyrone Sage, Dr. Ladona Ridgel and Dr. Patty Sermons.  Appointment dates and times are listed below.     Consults: cardiology and pulmonary/intensive care  Significant Diagnostic Studies: TTE  - Left ventricle: The cavity size was severely dilated. Wall thickness was increased in a pattern of mild LVH. The estimated ejection fraction was 25%. Diffuse hypokinesis. - Aortic valve: Gradients a bit low for severe AS due to LV dysfunction There was severe stenosis. Mild regurgitation. Valve area: 0.59cm^2(VTI). Valve area: 0.63cm^2 (Vmax). - Mitral valve: Mild regurgitation. - Left atrium: The atrium was mildly dilated. - Atrial septum: No defect or patent foramen ovale was identified. Transthoracic echocardiography. M-mode, complete 2D, spectral Doppler, and color Doppler. Height: Height: 177.8cm. Height: 70in. Weight: Weight: 79.4kg. Weight: 174.6lb. Body mass index: BMI: 25.1kg/m^2. Body surface area: BSA: 1.85m^2. Blood pressure: 104/47. Patient status: Inpatient. Location: ICU/CCU  Treatments: surgery:   Coronary artery bypass grafting x3 with  left  internal mammary to the left anterior descending coronary artery,  reverse saphenous vein graft to intermediate coronary artery, and  reverse saphenous vein graft to the distal right coronary artery with  right leg endo vein harvesting, and aortic valve replacement or the  pericardial tissue valve [employer] model #[id number] TFX 23 mm, serial #[id number].  Disposition: SNF  Discharge Orders    Future Appointments: Provider: Department: Dept Phone: Center:   09/29/2011 10:00 AM Delight Ovens, MD [facility] [phone] [facility]     Medication List  As of 09/08/2011 10:04 AM   STOP taking these medications         aspirin 81 MG tablet      valsartan-hydrochlorothiazide 80-12.5 MG per tablet      VITAMIN D PO         TAKE these medications         amiodarone 400 MG tablet   Commonly known as: PACERONE   Take 1 tablet (400 mg total) by mouth daily.      aspirin 325 MG EC tablet   Take 1 tablet (325 mg total) by mouth daily.      feeding supplement Pudg   Take 1 Container by mouth 3 (three) times daily between meals.      folic acid 1 MG tablet   Commonly known as: FOLVITE   Take 1 tablet (1 mg total) by mouth daily.      furosemide 40  MG tablet   Commonly known as: LASIX   Take 1 tablet (40 mg total) by mouth 2 (two) times daily. For 7 days      guaiFENesin 600 MG 12 hr tablet   Commonly known as: MUCINEX   Take 1 tablet (600 mg total) by mouth 2 (two) times daily as needed.      iron polysaccharides 150 MG capsule   Commonly known as: NIFEREX   Take 1 capsule (150 mg total) by mouth daily.      metoprolol 50 MG tablet   Commonly known as: LOPRESSOR   Take 1 tablet (50 mg total) by mouth 2 (two) times daily.      spironolactone 25 MG tablet   Commonly known as: ALDACTONE   Take 1 tablet (25 mg total) by mouth daily. For 7 days      traMADol 50 MG tablet   Commonly known as: ULTRAM   Take 1 tablet (50 mg total) by mouth every 6 (six) hours as  needed.           Follow-up Information    Follow up with GERHARDT,EDWARD B, MD on 09/29/2011. (Appointment is at 10:00AM)    Contact information:   [address] [place] [address] [phone]       Follow up with [facility] on 09/29/2011. (Please get chest xray performed at 9:00, located on first floor of [facility])    Contact information:   [address]      Follow up with Cassell Clement, MD in 2 weeks. (Please contact office to set up appointment for 2 weeks from hospital discharge)    Contact information:   [address] [place] [address] [phone]       Follow up with Lewayne Bunting, MD in 4 weeks. (Please contact office to make an appointment for 4 weeks from hospital discharge)    Contact information:   [address] [place] [address] [phone]          Signed: Lowella Dandy 09/08/2011, 10:04 AM

## 2011-09-08 NOTE — Progress Notes (Signed)
Patient: Nakhi Choi Date of Encounter: 09/08/2011, 2:36 PM Admit date: 08/23/2011     Subjective  Mr. Jewel states he is feeling stronger. He denies CP, SOB, palpitations or dizziness. He is currently ambulating in the hall well, without difficulty.   Objective  Physical Exam: Vitals: BP 96/52  Pulse 85  Temp 98 F (36.7 C) (Oral)  Resp 20  Ht 5\' 10"  (1.778 m)  Wt 177 lb 0.5 oz (80.3 kg)  BMI 25.40 kg/m2  SpO2 94% General: Well developed, well appearing 78 year old male in no acute distress. Neck: Supple. JVD not elevated. Lungs: Faint bibasilar rales otherwise CTA bilaterally without wheezes or rhonchi. Breathing is unlabored. Heart: RRR S1 S2 without murmurs, rubs, or gallops.  Abdomen: Soft, non-distended. Extremities: No clubbing or cyanosis. Trace pedal edema bilaterally.   Neuro: Alert and oriented X 3. Moves all extremities spontaneously. No focal deficits.  Intake/Output: Intake/Output Summary (Last 24 hours) at 09/08/11 1436 Last data filed at 09/08/11 1326  Gross per 24 hour  Intake      0 ml  Output    851 ml  Net   -851 ml   Inpatient Medications:  . amiodarone  400 mg Oral Daily  . aspirin EC  325 mg Oral Daily  . docusate sodium  200 mg Oral Daily  . enoxaparin  30 mg Subcutaneous Q24H  . feeding supplement  1 Container Oral TID BM  . folic acid  1 mg Oral Daily  . furosemide  40 mg Oral BID  . iron polysaccharides  150 mg Oral Daily  . metoprolol tartrate  50 mg Oral BID  . pantoprazole  40 mg Oral QAC breakfast  . simvastatin  5 mg Oral q1800  . sodium chloride  3 mL Intravenous Q12H  . spironolactone  25 mg Oral Daily   Labs:  [facility] 09/08/11 0630 09/06/11 0829 09/06/11 0445  NA 136 -- 138  K 3.8 -- 4.3  CL 96 -- 95*  CO2 34* -- 37*  GLUCOSE 93 -- 155*  BUN 22 -- 23  CREATININE 0.92 -- 1.02  CALCIUM 8.8 -- 8.4  MG -- 2.3 --  PHOS -- -- --    Basename 09/07/11 0425 09/06/11 0445  WBC 11.9* 13.2*  NEUTROABS -- --  HGB 9.1*  8.9*  HCT 28.0* 28.1*  MCV 90.3 90.6  PLT 379 428*    Radiology/Studies: Dg Chest 2 View 09/08/2011   *RADIOLOGY REPORT*  Clinical Data: Short of breath.  Aortic valve replacement and pacemaker placement.  CHEST - 2 VIEW  Comparison: 09/07/2011.  Findings: Mild CHF persists.  CABG/median sternotomy with aortic valve replacement.  Dual lead left subclavian cardiac pacemaker appears similar to prior exam.  Bilateral basilar atelectasis, interstitial and mild alveolar pulmonary edema.  Dependently layering bilateral small pleural effusions with associated compressive atelectasis.  The compared yesterday's exam, the pleural effusions appear slightly smaller although overall aeration is little changed. Right upper extremity PICC appears similar.  IMPRESSION: Little interval change. Minimal decrease in pleural effusions and compressive atelectasis.   Original Report Authenticated By: Andreas Newport, M.D.   Telemetry: normal sinus rhythm; no further VT/VF   Assessment and Plan  1. VT/VF arrest s/p ICD implant - device function is normal; continue amiodarone 400 mg daily x 2 weeks, then 200 mg once daily thereafter until seen by Dr. Ladona Ridgel; wound check in 10-14 days; follow-up with Dr. Ladona Ridgel as outpatient in 3 months unless needed sooner 2. CAD, AS  s/p CABG, AVR 3. Chronic systolic CHF, LVEF 25-30% 4. Atrial fibrillation - in SR now  Signed, Lekendrick Alpern PA-C

## 2011-09-08 NOTE — Progress Notes (Signed)
Pt. Arrived to rehab unit at 1515.  VSS; pt. AOX3, no complaints of pain.  Oriented to rehab.  Wife at bedside.

## 2011-09-08 NOTE — Progress Notes (Signed)
CIR Admissions: Met with pt this AM and he reports that he is looking forward to rehab. Talked with pt's niece, Rhunette Croft who pt will stay with post d/c. She reports that she will be available for 24/7 assistance as needed.  Talked with pt's nurse and Dr Dennie Maizes PA to report that bed is available on CIR as soon as pt stable. Await notification from MD. Call for questions: [phone].

## 2011-09-08 NOTE — Progress Notes (Signed)
Report given to Angie, RN for transfer to room 4009 on inpatient rehab. Ambulated 150 ft with patient in hallway using walker. Patient tolerated fairly well. Patient stopped x 2 midway during ambulation d/t shortness of breath. Gait was mostly steady with an occasional drift to the left; possible d/t left arm sling immobilizer. Returned to room without any further incidence. Mamie Levers

## 2011-09-08 NOTE — Progress Notes (Signed)
Patient information reviewed and entered into [employer] system by Dyann Goodspeed, RN, CRRN, PPS Coordinator.  Information including medical coding and functional independence measure will be reviewed and updated through discharge.     Per nursing patient was given "Data Collection Information Summary for Patients in Inpatient Rehabilitation Facilities with attached "Privacy Act Statement-Health Care Records" upon admission.   

## 2011-09-09 ENCOUNTER — Inpatient Hospital Stay (HOSPITAL_COMMUNITY): Payer: Medicare Other | Admitting: Physical Therapy

## 2011-09-09 ENCOUNTER — Inpatient Hospital Stay (HOSPITAL_COMMUNITY): Payer: Medicare Other | Admitting: *Deleted

## 2011-09-09 DIAGNOSIS — R5381 Other malaise: Secondary | ICD-10-CM

## 2011-09-09 DIAGNOSIS — Z5189 Encounter for other specified aftercare: Secondary | ICD-10-CM

## 2011-09-09 LAB — COMPREHENSIVE METABOLIC PANEL
AST: 26 U/L (ref 0–37)
BUN: 17 mg/dL (ref 6–23)
CO2: 31 mEq/L (ref 19–32)
Calcium: 8.7 mg/dL (ref 8.4–10.5)
Chloride: 99 mEq/L (ref 96–112)
Creatinine, Ser: 0.84 mg/dL (ref 0.50–1.35)
GFR calc Af Amer: >90 mL/min (ref >90)
GFR calc non Af Amer: 81 mL/min — ABNORMAL LOW (ref >90)
Glucose, Bld: 94 mg/dL (ref 70–99)
Total Bilirubin: 0.4 mg/dL (ref 0.3–1.2)

## 2011-09-09 LAB — CBC
HCT: 28.1 % — ABNORMAL LOW (ref 39.0–52.0)
Hemoglobin: 9.2 g/dL — ABNORMAL LOW (ref 13.0–17.0)
MCH: 29.8 pg (ref 26.0–34.0)
MCV: 90.9 fL (ref 78.0–100.0)
Platelets: 271 10*3/uL (ref 150–400)
RBC: 3.09 MIL/uL — ABNORMAL LOW (ref 4.22–5.81)
WBC: 9.2 10*3/uL (ref 4.0–10.5)

## 2011-09-09 LAB — DIFFERENTIAL
Basophils Absolute: 0 10*3/uL (ref 0.0–0.1)
Basophils Relative: 0 % (ref 0–1)
Eosinophils Relative: 5 % (ref 0–5)
Monocytes Absolute: 0.9 10*3/uL (ref 0.1–1.0)
Neutro Abs: 6.9 10*3/uL (ref 1.7–7.7)

## 2011-09-09 MED ORDER — SODIUM CHLORIDE 0.9 % IJ SOLN
10.0000 mL | INTRAMUSCULAR | Status: DC | PRN
  Administered 2011-09-09 – 2011-09-10 (×3): 20 mL
  Administered 2011-09-11 – 2011-09-14 (×8): 10 mL
  Administered 2011-09-15: 20 mL

## 2011-09-09 NOTE — Progress Notes (Signed)
Patient ID: Gabriel Marsh, male   DOB: [birth year], 78 y.o.   MRN: [id number]  Subjective/Complaints: Slept well no shoulder pain Review of Systems  Respiratory: Positive for cough and shortness of breath.   Musculoskeletal: Positive for joint pain.  All other systems reviewed and are negative.    Objective: Vital Signs: Blood pressure 91/65, pulse 77, temperature 97.7 F (36.5 C), temperature source Oral, resp. rate 22, weight 81.375 kg (179 lb 6.4 oz), SpO2 96.00%. Dg Chest 2 View  09/08/2011  *RADIOLOGY REPORT*  Clinical Data: Short of breath.  Aortic valve replacement and pacemaker placement.  CHEST - 2 VIEW  Comparison: 09/07/2011.  Findings: Mild CHF persists.  CABG/median sternotomy with aortic valve replacement.  Dual lead left subclavian cardiac pacemaker appears similar to prior exam.  Bilateral basilar atelectasis, interstitial and mild alveolar pulmonary edema.  Dependently layering bilateral small pleural effusions with associated compressive atelectasis.  The compared yesterday's exam, the pleural effusions appear slightly smaller although overall aeration is little changed. Right upper extremity PICC appears similar.  IMPRESSION: Little interval change. Minimal decrease in pleural effusions and compressive atelectasis.  Original Report Authenticated By: Andreas Newport, M.D.   Results for orders placed during the hospital encounter of 09/08/11 (from the past 72 hour(s))  CBC     Status: Abnormal   Collection Time   09/09/11  5:18 AM      Component Value Range Comment   WBC 9.2  4.0 - 10.5 K/uL    RBC 3.09 (*) 4.22 - 5.81 MIL/uL    Hemoglobin 9.2 (*) 13.0 - 17.0 g/dL    HCT 11.9 (*) 14.7 - 52.0 %    MCV 90.9  78.0 - 100.0 fL    MCH 29.8  26.0 - 34.0 pg    MCHC 32.7  30.0 - 36.0 g/dL    RDW 82.9 (*) 56.2 - 15.5 %    Platelets 271  150 - 400 K/uL   COMPREHENSIVE METABOLIC PANEL     Status: Abnormal   Collection Time   09/09/11  5:18 AM      Component Value Range Comment   Sodium 135  135 - 145 mEq/L    Potassium 4.3  3.5 - 5.1 mEq/L    Chloride 99  96 - 112 mEq/L    CO2 31  19 - 32 mEq/L    Glucose, Bld 94  70 - 99 mg/dL    BUN 17  6 - 23 mg/dL    Creatinine, Ser 1.30  0.50 - 1.35 mg/dL    Calcium 8.7  8.4 - 86.5 mg/dL    Total Protein 5.4 (*) 6.0 - 8.3 g/dL    Albumin 2.4 (*) 3.5 - 5.2 g/dL    AST 26  0 - 37 U/L    ALT 30  0 - 53 U/L    Alkaline Phosphatase 68  39 - 117 U/L    Total Bilirubin 0.4  0.3 - 1.2 mg/dL    GFR calc non Af Amer 81 (*) >90 mL/min    GFR calc Af Amer >90  >90 mL/min   DIFFERENTIAL     Status: Abnormal   Collection Time   09/09/11  5:18 AM      Component Value Range Comment   Neutrophils Relative 75  43 - 77 %    Neutro Abs 6.9  1.7 - 7.7 K/uL    Lymphocytes Relative 10 (*) 12 - 46 %    Lymphs Abs 0.9  0.7 - 4.0 K/uL  Monocytes Relative 10  3 - 12 %    Monocytes Absolute 0.9  0.1 - 1.0 K/uL    Eosinophils Relative 5  0 - 5 %    Eosinophils Absolute 0.5  0.0 - 0.7 K/uL    Basophils Relative 0  0 - 1 %    Basophils Absolute 0.0  0.0 - 0.1 K/uL      HEENT: normal Cardio: RRR Resp: CTA B/L, Left base BS reduced GI: BS positive Extremity:  Edema R>L pedal Skin:   Wound sternotomy incison CDI, L ICD incison with occlusive dressing Neuro: Alert/Oriented and Normal Motor Musc/Skel:  Other decreased ROM L shoulder   Assessment/Plan: 1. Functional deficits secondary to deconditioning after CABG/AVR followed by ICD placement which require 3+ hours per day of interdisciplinary therapy in a comprehensive inpatient rehab setting. Physiatrist is providing close team supervision and 24 hour management of active medical problems listed below. Physiatrist and rehab team continue to assess barriers to discharge/monitor patient progress toward functional and medical goals. FIM:                                  Medical Problem List and Plan:  1. deconditioning after CABG/implantable cardiac defibrillator Per  Electrophysiology, will d/c sling, no pulling with L shoulder may use walker, ok for Full PROM and AROM 2. DVT Prophylaxis/Anticoagulation: Subcutaneous Lovenox. Monitor platelet counts any signs of bleeding  3. Pain Management: Ultram as needed. Monitor with increased mobility  4. Neuropsych: This patient is capable of making decisions on his/her own behalf.  5. Hypertension/recurrent malignant ventricular arrhythmias. Amiodarone 400 mg daily, Lopressor 50 mg twice a day, Aldactone 25 mg daily. Monitor with increased mobility  6. Hyperlipidemia. Zocor  7. Anemia. Continue Niferex. Followup CBC  8. Chronic systolic congestive heart failure. Monitor for any signs of fluid overload       LOS (Days) 1 A FACE TO FACE EVALUATION WAS PERFORMED  KIRSTEINS,ANDREW E 09/09/2011, 8:14 AM

## 2011-09-09 NOTE — Evaluation (Signed)
Physical Therapy Assessment and Plan  Patient Details  Name: Gabriel Marsh MRN: [id number] Date of Birth: Jul 15, 1930  PT Diagnosis: Difficulty walking and Muscle weakness Rehab Potential: Good ELOS: 7-10 days   Today's Date: 09/09/2011 Time: 1100-1155 55 minutes  Problem List:  Patient Active Problem List  Diagnosis  . Benign hypertensive heart disease without heart failure  . Pure hypercholesterolemia  . Severe aortic stenosis  . CAD (coronary artery disease), native coronary artery  . Hypertension  . Hyperlipidemia  . Chronic systolic CHF (congestive heart failure)  . Moderate mitral regurgitation  . Moderate aortic insufficiency  . Cardiogenic shock  . NSTEMI (non-ST elevated myocardial infarction)  . Acute respiratory failure  . Torsades de pointes  . Atrial fibrillation  . Acute on chronic systolic heart failure  . ICD-St.Jude  . Physical deconditioning    Past Medical History:  Past Medical History  Diagnosis Date  . Hypertension   . Hyperlipidemia   . Mitral regurgitation     Moderate by 08/20/11 echo  . Shortness of breath   . CAD (coronary artery disease)     a) 08/22/11 cath :  50-60% left main stenosis, mild-moderate LAD disease, 99% ostial large diagonal stenosis, occluded and collateralized LCx and RCA  . Aortic stenosis     Severe by 08/20/11 echo  . Aortic insufficiency     Moderate by 08/20/11 echo  . Chronic systolic CHF (congestive heart failure)     a) 08/20/11 echo : LVEF 35-40%   Past Surgical History:  Past Surgical History  Procedure Date  . Eye surgery   . Cardiac catheterization 08/22/11     50-60% left main stenosis, mild-moderate LAD disease, 99% ostial large diagonal stenosis, occluded and collateralized LCx and RCA  . Coronary artery bypass graft 08/25/2011    Procedure: CORONARY ARTERY BYPASS GRAFTING (CABG);  Surgeon: Delight Ovens, MD;  Location: [facility] OR;  Service: Open Heart Surgery;  Laterality: N/A;  Hot and humid all  through surgery.  . Aortic valve replacement 08/25/2011    Procedure: AORTIC VALVE REPLACEMENT (AVR);  Surgeon: Delight Ovens, MD;  Location: [facility] OR;  Service: Open Heart Surgery;  Laterality: N/A;  hot and humid all through surgery.    Assessment & Plan Clinical Impression: Patient is a 78 y.o. year old male with recent admission to the hospital on 08/23/2011 with pulmonary edema and acute onset of systolic dysfunction. Cardiac enzymes were positive. Patient with nonspecific chest pain to the right shoulder. Echocardiogram revealed critical aortic stenosis with severe left ventricular dysfunction and ejection fraction of 25%. A cardiac catheterization was completed revealing severe three-vessel disease. Underwent coronary artery bypass grafting x3 with right leg endo vein harvesting and aortic valve replacement 08/29/2011. Patient transferred to CIR on 09/08/2011 .   Patient currently requires min with mobility secondary to muscle weakness, decreased cardiorespiratoy endurance and decreased standing balance and decreased balance strategies.  Prior to hospitalization, patient was independent with mobility and lived with Alone in a House home.  Home access is  Level entry.  Patient will benefit from skilled PT intervention to maximize safe functional mobility, minimize fall risk and decrease caregiver burden for planned discharge home with intermittent assist.  Anticipate patient will benefit from follow up OP at discharge.  PT - End of Session Activity Tolerance: Tolerates 30+ min activity with multiple rests Endurance Deficit: Yes Endurance Deficit Description: needs frequent rets PT Assessment Rehab Potential: Good PT Plan PT Frequency: 1-2 X/day, 60-90 minutes Estimated Length of  Stay: 7-10 days PT Treatment/Interventions: Ambulation/gait training;Balance/vestibular training;Discharge planning;Community reintegration;DME/adaptive equipment instruction;Functional mobility  training;Neuromuscular re-education;Pain management;Therapeutic Exercise;UE/LE Coordination activities;Wheelchair propulsion/positioning;Therapeutic Activities;Stair training;Patient/family education;UE/LE Strength taining/ROM PT Recommendation Follow Up Recommendations: Outpatient PT  PT Evaluation Precautions/Restrictions Precautions Precautions: Fall Restrictions Weight Bearing Restrictions: No Vital Signs Therapy Vitals Oxygen Therapy SpO2: 96 % (with activity) O2 Device: None (Room air) Pain Pain Assessment Pain Assessment: No/denies pain Home Living/Prior Functioning Home Living Lives With: Alone Available Help at Discharge: Family;Available 24 hours/day Type of Home: House Home Access: Level entry Home Layout: One level (2 steps to bedroom with rails) Prior Function Level of Independence: Independent with basic ADLs;Independent with gait;Independent with transfers Able to Take Stairs?: Yes Driving: Yes Vocation: Retired    IT consultant Overall Cognitive Status: Appears within functional limits for tasks assessed Sensation Sensation Light Touch: Appears Intact Proprioception: Appears Intact Coordination Gross Motor Movements are Fluid and Coordinated: Yes Motor  Motor Motor - Skilled Clinical Observations: generalized weakness  Mobility Transfers Sit to Stand: 4: Min assist Sit to Stand Details (indicate cue type and reason): steadying assist for balance Stand to Sit: 4: Min assist Stand to Sit Details: assist to control descent Stand Pivot Transfers: 4: Min assist Stand Pivot Transfer Details (indicate cue type and reason): assist for wt shifts Locomotion  Ambulation Ambulation: Yes Ambulation/Gait Assistance: 4: Min assist Ambulation Distance (Feet): 75 Feet Assistive device: None Ambulation/Gait Assistance Details: steadying assist for balance, pt with slight LOB with walking backward, performing divided attention tasks Stairs / Additional  Locomotion Stairs: Yes Stairs Assistance: 4: Min assist Stair Management Technique: Two rails Number of Stairs: 3  Wheelchair Mobility Wheelchair Mobility: Yes Wheelchair Assistance: 5: Investment banker, operational: Both upper extremities Wheelchair Parts Management: Needs assistance Distance: 50  Trunk/Postural Assessment  Cervical Assessment Cervical Assessment: Within Functional Limits Thoracic Assessment Thoracic Assessment: Within Functional Limits Lumbar Assessment Lumbar Assessment: Within Functional Limits Postural Control Postural Control: Within Functional Limits  Balance Standardized Balance Assessment Standardized Balance Assessment: Berg Balance Test Berg Balance Test Sit to Stand: Able to stand  independently using hands Standing Unsupported: Able to stand 2 minutes with supervision Sitting with Back Unsupported but Feet Supported on Floor or Stool: Able to sit safely and securely 2 minutes Stand to Sit: Controls descent by using hands Transfers: Able to transfer safely, definite need of hands Standing Unsupported with Eyes Closed: Needs help to keep from falling Standing Ubsupported with Feet Together: Needs help to attain position but able to stand for 30 seconds with feet together From Standing, Reach Forward with Outstretched Arm: Reaches forward but needs supervision From Standing Position, Pick up Object from Floor: Able to pick up shoe, needs supervision From Standing Position, Turn to Look Behind Over each Shoulder: Needs assist to keep from losing balance and falling Turn 360 Degrees: Needs close supervision or verbal cueing Standing Unsupported, Alternately Place Feet on Step/Stool: Able to complete >2 steps/needs minimal assist Standing Unsupported, One Foot in Front: Loses balance while stepping or standing Standing on One Leg: Unable to try or needs assist to prevent fall Total Score: 23  Extremity Assessment      RLE Assessment RLE  Assessment: Within Functional Limits LLE Assessment LLE Assessment: Within Functional Limits  See FIM for current functional status Refer to Care Plan for Long Term Goals  Recommendations for other services: None  Discharge Criteria: Patient will be discharged from PT if patient refuses treatment 3 consecutive times without medical reason, if treatment goals not met, if there is a  change in medical status, if patient makes no progress towards goals or if patient is discharged from hospital.  The above assessment, treatment plan, treatment alternatives and goals were discussed and mutually agreed upon: by patient  Treatment initiated during session: Gait training without AD with HHA, min A x 75', HR 100bpm after gait, pt reports he feels "very weak" at end of walk.  Pt gait with RW 94' with close supervision, less complaint of weakness, HR 75bpm after gait.  Berg balance test performed, pt scored 23/56 which indicates 100% risk of falls.  Pt educated on his fall risk and he expresses understanding of need for balance training and use of RW for safety at this time.  Pt requires frequent rest breaks throughout session but O2 stayed >95% on room air.  Gabriel Marsh 09/09/2011, 12:28 PM

## 2011-09-09 NOTE — Evaluation (Signed)
Occupational Therapy Assessment and Plan  Patient Details  Name: Gabriel Marsh MRN: [id number] Date of Birth: 1930/11/25  OT Diagnosis: muscle weakness (generalized) Rehab Potential: Rehab Potential: Good ELOS: 7-10 days   Today's Date: 09/09/2011 Time:  -   1000-1100  ( )  1st session  Individual therapy Pain:  none  Today's Date: 09/09/2011 Time:  -   1500-1550   ( ) 2nd session  Individual therapy Pain:  None Pt.  Engaged in functional mobility around room and to the shower.  He stepped in/out of shower with minimal assist.  Niece has a walk in shower with curtain and a shower seat with no back.  He was short of breath after this activity and needed to rest.  Discussed plan of care, OT goals, length of stay and intermittent supervision>  Pt. Verbalized understanding.    Problem List:  Patient Active Problem List  Diagnosis  . Benign hypertensive heart disease without heart failure  . Pure hypercholesterolemia  . Severe aortic stenosis  . CAD (coronary artery disease), native coronary artery  . Hypertension  . Hyperlipidemia  . Chronic systolic CHF (congestive heart failure)  . Moderate mitral regurgitation  . Moderate aortic insufficiency  . Cardiogenic shock  . NSTEMI (non-ST elevated myocardial infarction)  . Acute respiratory failure  . Torsades de pointes  . Atrial fibrillation  . Acute on chronic systolic heart failure  . ICD-St.Jude  . Physical deconditioning    Past Medical History:  Past Medical History  Diagnosis Date  . Hypertension   . Hyperlipidemia   . Mitral regurgitation     Moderate by 08/20/11 echo  . Shortness of breath   . CAD (coronary artery disease)     a) 08/22/11 cath :  50-60% left main stenosis, mild-moderate LAD disease, 99% ostial large diagonal stenosis, occluded and collateralized LCx and RCA  . Aortic stenosis     Severe by 08/20/11 echo  . Aortic insufficiency     Moderate by 08/20/11 echo  . Chronic systolic CHF  (congestive heart failure)     a) 08/20/11 echo : LVEF 35-40%   Past Surgical History:  Past Surgical History  Procedure Date  . Eye surgery   . Cardiac catheterization 08/22/11     50-60% left main stenosis, mild-moderate LAD disease, 99% ostial large diagonal stenosis, occluded and collateralized LCx and RCA  . Coronary artery bypass graft 08/25/2011    Procedure: CORONARY ARTERY BYPASS GRAFTING (CABG);  Surgeon: Delight Ovens, MD;  Location: [facility] OR;  Service: Open Heart Surgery;  Laterality: N/A;  Hot and humid all through surgery.  . Aortic valve replacement 08/25/2011    Procedure: AORTIC VALVE REPLACEMENT (AVR);  Surgeon: Delight Ovens, MD;  Location: [facility] OR;  Service: Open Heart Surgery;  Laterality: N/A;  hot and humid all through surgery.    Assessment & Plan Clinical Impression:Gabriel Marsh is a 78 y.o. right-handed male with history of coronary artery disease and severe aortic stenosis as well as congestive heart failure. Admitted 08/23/2011 with pulmonary edema and acute onset of systolic dysfunction. Cardiac enzymes were positive. Patient with nonspecific chest pain to the right shoulder. Echocardiogram revealed critical aortic stenosis with severe left ventricular dysfunction and ejection fraction of 25%. A cardiac catheterization was completed revealing severe three-vessel disease. Underwent coronary artery bypass grafting x3 with right leg endo vein harvesting and aortic valve replacement 08/29/2011 per Dr. Tyrone Sage. Patient placed on aspirin therapy as well as subcutaneous Lovenox. Postoperative atrial fibrillation maintained on amiodarone 400  mg daily as well as Lopressor. Plan ICD placement per cardiology services and procedure completed 09/06/2011 without postoperative complications. Physical therapy ongoing noted deconditioning and still needing oxygen therapy during activities. M.D. is requested physical medicine rehabilitation consult to consider inpatient rehabilitation  services. Patient was felt to be a candidate for inpatient rehabilitation services and was admitted for comprehensive rehabilitation program .  Patient transferred to CIR on 09/08/2011 .    Patient currently requires mod with basic self-care skills secondary to decreased cardiorespiratoy endurance.  Prior to hospitalization, patient could complete BADL with no assistance.  Patient will benefit from skilled intervention to increase independence with basic self-care skills prior to discharge home with care partner.  Anticipate patient will require intermittent supervision and follow up home health.  OT - End of Session Activity Tolerance: Tolerates 10 - 20 min activity with multiple rests Endurance Deficit: Yes Endurance Deficit Description: rest breaks every 5 mins OT Assessment Rehab Potential: Good Barriers to Discharge: None OT Plan OT Frequency: 1-2 X/day, 60-90 minutes Estimated Length of Stay: 7-10 days OT Treatment/Interventions: Balance/vestibular training;DME/adaptive equipment instruction;Patient/family education;Self Care/advanced ADL retraining;Therapeutic Activities;Therapeutic Exercise;Functional mobility training;Discharge planning  OT Evaluation Precautions/Restrictions  Precautions Precautions: Fall Precaution Comments: no longer needs O2 for gait.   Restrictions Weight Bearing Restrictions: No     Pain:  none   Home Living/Prior Functioning Home Living Lives With: Alone Available Help at Discharge:  (Plans to stay with niece in [place] until he can go back to [place]) Type of Home: House Home Access: Level entry (2 ,steps to get to bedroom) Entrance Stairs-Number of Steps: 2 (2 steps to enter at niece's house) Entrance Stairs-Rails: Right;Left Home Layout: One level Bathroom Shower/Tub: Walk-in Contractor: Standard Bathroom Accessibility: Yes How Accessible: Accessible via walker Home Adaptive Equipment: Walker - rolling;Hand-held shower  hose;Shower chair without back IADL History Homemaking Responsibilities: Yes Meal Prep Responsibility: Primary Laundry Responsibility: Primary Cleaning Responsibility: Primary Bill Paying/Finance Responsibility: Primary Shopping Responsibility: Primary Current License: Yes Mode of Transportation: Car Leisure and Hobbies: flower garden IADL Comments: [employer] 3xwk Prior Function Level of Independence: Independent with homemaking with ambulation;Independent with transfers;Independent with gait Able to Take Stairs?: Yes Driving: Yes Vocation: Retired    Optometrist - History Baseline Vision: Wears glasses all the time Vision - Assessment Eye Alignment: Within Functional Limits Vision Assessment: Vision not tested  Cognition Overall Cognitive Status: Appears within functional limits for tasks assessed Arousal/Alertness: Awake/alert Orientation Level: Oriented X4 Attention: Selective Selective Attention: Appears intact Memory: Appears intact Awareness: Appears intact Problem Solving: Appears intact Sensation Sensation Light Touch: Appears Intact (for BUE) Coordination Gross Motor Movements are Fluid and Coordinated: Yes Fine Motor Movements are Fluid and Coordinated: Yes Motor  Motor Motor - Skilled Clinical Observations: low endurance (low endurance) Mobility  Transfers Sit to Stand: 4: Min assist  Trunk/Postural Assessment  Cervical Assessment Cervical Assessment: Within Functional Limits Thoracic Assessment Thoracic Assessment: Within Functional Limits Lumbar Assessment Lumbar Assessment: Within Functional Limits Postural Control Postural Control: Within Functional Limits  Balance Dynamic Sitting Balance Dynamic Sitting - Balance Support: Feet supported;Left upper extremity supported Dynamic Sitting - Level of Assistance: 5: Stand by assistance Static Standing Balance Static Standing - Balance Support: Bilateral upper extremity supported Static  Standing - Level of Assistance: 5: Stand by assistance Extremity/Trunk Assessment RUE Assessment RUE Assessment: Within Functional Limits LUE Assessment LUE Assessment: Within Functional Limits LUE AROM (degrees) Left Shoulder Flexion:  (160 degrees of shoulder flexion)  See FIM for current functional status  Refer to Care Plan for Long Term Goals  Recommendations for other services: None  Discharge Criteria: Patient will be discharged from OT if patient refuses treatment 3 consecutive times without medical reason, if treatment goals not met, if there is a change in medical status, if patient makes no progress towards goals or if patient is discharged from hospital.  The above assessment, treatment plan, treatment alternatives and goals were discussed and mutually agreed upon: by patient  Humberto Seals 09/09/2011, 3:57 PM

## 2011-09-09 NOTE — Progress Notes (Signed)
Physical Therapy Session Note  Patient Details  Name: Gabriel Marsh MRN: [id number] Date of Birth: Feb 05, 1930  Today's Date: 09/09/2011 Time: 3086-5784 Time Calculation (min): 27 min  Short Term Goals: Week 1:     Skilled Therapeutic Interventions/Progress Updates:    Pt reporting fatigue from morning therapies and reports since admission afternoons have always been harder for him. Pt advised to take rest/nap in middle of day to improve therapy tolerance.   Session focused on standing balance, endurance, and strength. Performed static stance with varied bases of support + horizontal and vertical head movements. Bil. Foot taps working single limb stance and endurance. Sit <> stands without use of arms 2 x 5 continuous reps. Pt requires frequent rest breaks throughout session, SpO2 >96% during session.   Therapy Documentation Precautions:  Precautions Precautions: Fall Precaution Comments: no longer needs O2 for gait.   Restrictions Weight Bearing Restrictions: No Pain: Pain Assessment Pain Assessment: No/denies pain  See FIM for current functional status  Therapy/Group: Individual Therapy  Wilhemina Bonito 09/09/2011, 5:40 PM

## 2011-09-10 ENCOUNTER — Inpatient Hospital Stay (HOSPITAL_COMMUNITY): Payer: Medicare Other | Admitting: *Deleted

## 2011-09-10 ENCOUNTER — Inpatient Hospital Stay (HOSPITAL_COMMUNITY): Payer: Medicare Other | Admitting: Occupational Therapy

## 2011-09-10 ENCOUNTER — Inpatient Hospital Stay (HOSPITAL_COMMUNITY): Payer: Medicare Other | Admitting: Physical Therapy

## 2011-09-10 MED ORDER — METOPROLOL TARTRATE 25 MG PO TABS
25.0000 mg | ORAL_TABLET | Freq: Two times a day (BID) | ORAL | Status: DC
  Administered 2011-09-11 – 2011-09-16 (×11): 25 mg via ORAL
  Filled 2011-09-10 (×14): qty 1

## 2011-09-10 NOTE — Progress Notes (Signed)
Patient ID: Gabriel Marsh, male   DOB: October 07, 1930, 78 y.o.   MRN: [id number]  Subjective/Complaints: Slept well after taking pain pill forshoulder pain.  No dizziness, RN notes BP running low Review of Systems  Respiratory: Positive for cough and shortness of breath.   Musculoskeletal: Positive for joint pain.  All other systems reviewed and are negative.    Objective: Vital Signs: Blood pressure 113/69, pulse 81, temperature 97.9 F (36.6 C), temperature source Oral, resp. rate 20, weight 79.742 kg (175 lb 12.8 oz), SpO2 96.00%. No results found. Results for orders placed during the hospital encounter of 09/08/11 (from the past 72 hour(s))  CBC     Status: Abnormal   Collection Time   09/09/11  5:18 AM      Component Value Range Comment   WBC 9.2  4.0 - 10.5 K/uL    RBC 3.09 (*) 4.22 - 5.81 MIL/uL    Hemoglobin 9.2 (*) 13.0 - 17.0 g/dL    HCT 40.9 (*) 81.1 - 52.0 %    MCV 90.9  78.0 - 100.0 fL    MCH 29.8  26.0 - 34.0 pg    MCHC 32.7  30.0 - 36.0 g/dL    RDW 91.4 (*) 78.2 - 15.5 %    Platelets 271  150 - 400 K/uL   COMPREHENSIVE METABOLIC PANEL     Status: Abnormal   Collection Time   09/09/11  5:18 AM      Component Value Range Comment   Sodium 135  135 - 145 mEq/L    Potassium 4.3  3.5 - 5.1 mEq/L    Chloride 99  96 - 112 mEq/L    CO2 31  19 - 32 mEq/L    Glucose, Bld 94  70 - 99 mg/dL    BUN 17  6 - 23 mg/dL    Creatinine, Ser 9.56  0.50 - 1.35 mg/dL    Calcium 8.7  8.4 - 21.3 mg/dL    Total Protein 5.4 (*) 6.0 - 8.3 g/dL    Albumin 2.4 (*) 3.5 - 5.2 g/dL    AST 26  0 - 37 U/L    ALT 30  0 - 53 U/L    Alkaline Phosphatase 68  39 - 117 U/L    Total Bilirubin 0.4  0.3 - 1.2 mg/dL    GFR calc non Af Amer 81 (*) >90 mL/min    GFR calc Af Amer >90  >90 mL/min   DIFFERENTIAL     Status: Abnormal   Collection Time   09/09/11  5:18 AM      Component Value Range Comment   Neutrophils Relative 75  43 - 77 %    Neutro Abs 6.9  1.7 - 7.7 K/uL    Lymphocytes Relative 10 (*)  12 - 46 %    Lymphs Abs 0.9  0.7 - 4.0 K/uL    Monocytes Relative 10  3 - 12 %    Monocytes Absolute 0.9  0.1 - 1.0 K/uL    Eosinophils Relative 5  0 - 5 %    Eosinophils Absolute 0.5  0.0 - 0.7 K/uL    Basophils Relative 0  0 - 1 %    Basophils Absolute 0.0  0.0 - 0.1 K/uL      HEENT: normal Cardio: RRR Resp: CTA B/L, Left base BS reduced GI: BS positive Extremity:  Edema R>L pedal Skin:   Wound sternotomy incison CDI, L ICD incison with occlusive dressing Neuro: Alert/Oriented and  Normal Motor Musc/Skel:  Other decreased ROM L shoulder   Assessment/Plan: 1. Functional deficits secondary to deconditioning after CABG/AVR followed by ICD placement which require 3+ hours per day of interdisciplinary therapy in a comprehensive inpatient rehab setting. Physiatrist is providing close team supervision and 24 hour management of active medical problems listed below. Physiatrist and rehab team continue to assess barriers to discharge/monitor patient progress toward functional and medical goals. FIM: FIM - Bathing Bathing Steps Patient Completed: Chest;Right Arm;Left Arm;Abdomen;Front perineal area;Buttocks (in w/c at sink; elected not to wash feet today) Bathing: 5: Supervision: Safety issues/verbal cues  FIM - Upper Body Dressing/Undressing Upper body dressing/undressing steps patient completed: Thread/unthread right sleeve of pullover shirt/dresss;Thread/unthread left sleeve of pullover shirt/dress;Put head through opening of pull over shirt/dress;Pull shirt over trunk Upper body dressing/undressing: 5: Supervision: Safety issues/verbal cues FIM - Lower Body Dressing/Undressing Lower body dressing/undressing steps patient completed: Thread/unthread right pants leg;Thread/unthread left pants leg;Pull pants up/down;Don/Doff right sock;Don/Doff left sock;Fasten/unfasten pants (wore pants and socks only today) Lower body dressing/undressing: 5: Supervision: Safety issues/verbal cues  FIM -  Toileting Toileting steps completed by patient: Adjust clothing prior to toileting;Performs perineal hygiene;Adjust clothing after toileting Toileting Assistive Devices: Grab bar or rail for support Toileting: 4: Steadying assist  FIM - Diplomatic Services operational officer Devices: Grab bars Toilet Transfers: 4-To toilet/BSC: Min A (steadying Pt. > 75%);4-From toilet/BSC: Min A (steadying Pt. > 75%)  FIM - Bed/Chair Transfer Bed/Chair Transfer: 4: Chair or W/C > Bed: Min A (steadying Pt. > 75%);4: Bed > Chair or W/C: Min A (steadying Pt. > 75%)  FIM - Locomotion: Wheelchair Distance: 50 Locomotion: Wheelchair: 2: Travels 50 - 149 ft with supervision, cueing or coaxing FIM - Locomotion: Ambulation Ambulation/Gait Assistance: 4: Min assist Locomotion: Ambulation: 2: Travels 50 - 149 ft with minimal assistance (Pt.>75%)  Comprehension Comprehension Mode: Auditory Comprehension: 6-Follows complex conversation/direction: With extra time/assistive device  Expression Expression Mode: Verbal Expression: 5-Expresses basic 90% of the time/requires cueing < 10% of the time.  Social Interaction Social Interaction: 6-Interacts appropriately with others with medication or extra time (anti-anxiety, antidepressant).  Problem Solving Problem Solving: 5-Solves complex 90% of the time/cues < 10% of the time  Memory Memory: 5-Recognizes or recalls 90% of the time/requires cueing < 10% of the time  Medical Problem List and Plan:  1. deconditioning after CABG/implantable cardiac defibrillator Per Electrophysiology, will d/c sling, no pulling with L shoulder may use walker, ok for Full PROM and AROM 2. DVT Prophylaxis/Anticoagulation: Subcutaneous Lovenox. Monitor platelet counts any signs of bleeding  3. Pain Management: Ultram as needed. Monitor with increased mobility  4. Neuropsych: This patient is capable of making decisions on his/her own behalf.  5. Hypertension/recurrent malignant  ventricular arrhythmias. Amiodarone 400 mg daily, Lopressor 50 mg twice a day, will reduce  Aldactone 25 mg daily. Monitor with increased mobility, check orthostatics 6. Hyperlipidemia. Zocor  7. Anemia. Continue Niferex. Followup CBC  8. Chronic systolic congestive heart failure. Monitor for any signs of fluid overload       LOS (Days) 2 A FACE TO FACE EVALUATION WAS PERFORMED  KIRSTEINS,ANDREW E 09/10/2011, 9:24 AM

## 2011-09-10 NOTE — Progress Notes (Signed)
Physical Therapy Session Note  Patient Details  Name: Roman Dubuc MRN: [id number] Date of Birth: [birth year]  Today's Date: 09/10/2011 Time:0906-1000,  1100-1200 Time Calculation (min): , 60 min  Short Term Goals: Week 1:     Skilled Therapeutic Interventions/Progress Updates:   Therapeutic Activity: 1.amb with RW to gym with S. 134ft. 2. Multiple Sit<>stand, without UE support and Bed Mobility, cues for body mechanics. NMR: balance training- 1.Alternate stepping and weight shifting in various directions progressing from 2 to no UE support, MinA 2. Standing with feet together with head movements x4 directions, MinA. 3. Hip sway x4 directions, no UE support, MinA. 4. Marching and walking backwards, using 1 UE support, S. Cues for technique for all tasks. LE strengthening Group exercises: Supine, AROM, 20x ea. With cues for technique.  Therapy Documentation Precautions:  Precautions Precautions: Fall Precaution Comments: no longer needs O2 for gait.   Restrictions Weight Bearing Restrictions: No Vital Signs: Therapy Vitals Temp: 97.7 F (36.5 C) Temp src: Oral Pulse Rate: 69  Resp: 22  BP: 99/65 mmHg Patient Position, if appropriate: Lying Oxygen Therapy SpO2: 93 % O2 Device: None (Room air) Pain:  0/10 See FIM for current functional status  Therapy/Group: Individual Therapy and Group   Hortencia Conradi, PTA 09/10/2011, 4:16 PM

## 2011-09-10 NOTE — Progress Notes (Signed)
Occupational Therapy Session Note  Patient Details  Name: Martha Soltys MRN: [id number] Date of Birth: October 24, 1930  Today's Date: 09/10/2011 Time: 0730-0815 and  Time Calculation (min): 45 min  Skilled Therapeutic Interventions/Progress Updates: AM session:  ADL in w/c at sink and toileting with focus on demonstrating sternal precautions.  Patient able to bathe and dress demonstrating precautions; however, would benefit from continued opportunities to go sit to stand adhering to precautions.  Patient fatigued with toileting tasks and with bathing and dressing tasks PM session:  Sit to stand utilizing sternal precautions from lower surfaces with some difficulty due to overall deconditioning and LE weakness;  Placed 3:1 over room toilet and patient was able to scoot to front of 3:1 and stand with distant Supervision; niece present for therapy session and stated Mr. Magowan will d/c to her home before eventually returning to his home in [place], [place]    Therapy Documentation Precautions: sternal Pain: denied  See FIM for current functional status  Therapy/Group: Individual Therapy  Bud Face [facility] 09/10/2011, 11:55 AM

## 2011-09-11 ENCOUNTER — Inpatient Hospital Stay (HOSPITAL_COMMUNITY): Payer: Medicare Other | Admitting: *Deleted

## 2011-09-11 MED ORDER — TRAZODONE HCL 50 MG PO TABS
50.0000 mg | ORAL_TABLET | Freq: Every evening | ORAL | Status: DC | PRN
  Administered 2011-09-11: 50 mg via ORAL
  Filled 2011-09-11: qty 1

## 2011-09-11 NOTE — Progress Notes (Signed)
Patient ID: Gabriel Marsh, male   DOB: 04-08-1930, 78 y.o.   MRN: [id number]  Subjective/Complaints: Slept well after taking pain pill forshoulder pain.  Slept poorly but no obvious reason.    No dizziness, RN notes BP running low Review of Systems  Respiratory: Positive for cough and shortness of breath.   Musculoskeletal: Positive for joint pain.  All other systems reviewed and are negative.    Objective: Vital Signs: Blood pressure 121/78, pulse 84, temperature 97.8 F (36.6 C), temperature source Oral, resp. rate 18, weight 81.285 kg (179 lb 3.2 oz), SpO2 97.00%. No results found. Results for orders placed during the hospital encounter of 09/08/11 (from the past 72 hour(s))  CBC     Status: Abnormal   Collection Time   09/09/11  5:18 AM      Component Value Range Comment   WBC 9.2  4.0 - 10.5 K/uL    RBC 3.09 (*) 4.22 - 5.81 MIL/uL    Hemoglobin 9.2 (*) 13.0 - 17.0 g/dL    HCT 95.2 (*) 84.1 - 52.0 %    MCV 90.9  78.0 - 100.0 fL    MCH 29.8  26.0 - 34.0 pg    MCHC 32.7  30.0 - 36.0 g/dL    RDW 32.4 (*) 40.1 - 15.5 %    Platelets 271  150 - 400 K/uL   COMPREHENSIVE METABOLIC PANEL     Status: Abnormal   Collection Time   09/09/11  5:18 AM      Component Value Range Comment   Sodium 135  135 - 145 mEq/L    Potassium 4.3  3.5 - 5.1 mEq/L    Chloride 99  96 - 112 mEq/L    CO2 31  19 - 32 mEq/L    Glucose, Bld 94  70 - 99 mg/dL    BUN 17  6 - 23 mg/dL    Creatinine, Ser 0.27  0.50 - 1.35 mg/dL    Calcium 8.7  8.4 - 25.3 mg/dL    Total Protein 5.4 (*) 6.0 - 8.3 g/dL    Albumin 2.4 (*) 3.5 - 5.2 g/dL    AST 26  0 - 37 U/L    ALT 30  0 - 53 U/L    Alkaline Phosphatase 68  39 - 117 U/L    Total Bilirubin 0.4  0.3 - 1.2 mg/dL    GFR calc non Af Amer 81 (*) >90 mL/min    GFR calc Af Amer >90  >90 mL/min   DIFFERENTIAL     Status: Abnormal   Collection Time   09/09/11  5:18 AM      Component Value Range Comment   Neutrophils Relative 75  43 - 77 %    Neutro Abs 6.9  1.7 - 7.7  K/uL    Lymphocytes Relative 10 (*) 12 - 46 %    Lymphs Abs 0.9  0.7 - 4.0 K/uL    Monocytes Relative 10  3 - 12 %    Monocytes Absolute 0.9  0.1 - 1.0 K/uL    Eosinophils Relative 5  0 - 5 %    Eosinophils Absolute 0.5  0.0 - 0.7 K/uL    Basophils Relative 0  0 - 1 %    Basophils Absolute 0.0  0.0 - 0.1 K/uL      HEENT: normal Cardio: RRR Resp: CTA B/L, Left base BS reduced GI: BS positive Extremity:  Edema R>L pedal Skin:   Wound sternotomy incison CDI,  L ICD incison with occlusive dressing Neuro: Alert/Oriented and Normal Motor Musc/Skel:  Other decreased ROM L shoulder   Assessment/Plan: 1. Functional deficits secondary to deconditioning after CABG/AVR followed by ICD placement which require 3+ hours per day of interdisciplinary therapy in a comprehensive inpatient rehab setting. Physiatrist is providing close team supervision and 24 hour management of active medical problems listed below. Physiatrist and rehab team continue to assess barriers to discharge/monitor patient progress toward functional and medical goals. FIM: FIM - Bathing Bathing Steps Patient Completed: Chest;Right Arm;Left Arm;Abdomen;Front perineal area;Buttocks (in w/c at sink; elected not to wash feet today) Bathing: 5: Supervision: Safety issues/verbal cues  FIM - Upper Body Dressing/Undressing Upper body dressing/undressing steps patient completed: Thread/unthread right sleeve of pullover shirt/dresss;Thread/unthread left sleeve of pullover shirt/dress;Put head through opening of pull over shirt/dress;Pull shirt over trunk Upper body dressing/undressing: 5: Supervision: Safety issues/verbal cues FIM - Lower Body Dressing/Undressing Lower body dressing/undressing steps patient completed: Thread/unthread right pants leg;Thread/unthread left pants leg;Pull pants up/down;Don/Doff right sock;Don/Doff left sock;Fasten/unfasten pants (wore pants and socks only today) Lower body dressing/undressing: 5: Supervision:  Safety issues/verbal cues  FIM - Toileting Toileting steps completed by patient: Adjust clothing prior to toileting;Performs perineal hygiene;Adjust clothing after toileting Toileting Assistive Devices: Grab bar or rail for support Toileting: 4: Steadying assist  FIM - Diplomatic Services operational officer Devices: Grab bars Toilet Transfers: 4-From toilet/BSC: Min A (steadying Pt. > 75%);4-To toilet/BSC: Min A (steadying Pt. > 75%)  FIM - Bed/Chair Transfer Bed/Chair Transfer Assistive Devices: Arm rests Bed/Chair Transfer: 4: Supine > Sit: Min A (steadying Pt. > 75%/lift 1 leg);4: Sit > Supine: Min A (steadying pt. > 75%/lift 1 leg);4: Bed > Chair or W/C: Min A (steadying Pt. > 75%);4: Chair or W/C > Bed: Min A (steadying Pt. > 75%)  FIM - Locomotion: Wheelchair Distance: 50 Locomotion: Wheelchair: 1: Total Assistance/staff pushes wheelchair (Pt<25%) FIM - Locomotion: Ambulation Locomotion: Ambulation Assistive Devices: Designer, industrial/product Ambulation/Gait Assistance: 5: Supervision Locomotion: Ambulation: 2: Travels 50 - 149 ft with supervision/safety issues  Comprehension Comprehension Mode:  (deaf in left ear and HOH in right) Comprehension: 6-Follows complex conversation/direction: With extra time/assistive device  Expression Expression Mode: Verbal Expression: 6-Expresses complex ideas: With extra time/assistive device  Social Interaction Social Interaction: 6-Interacts appropriately with others with medication or extra time (anti-anxiety, antidepressant).  Problem Solving Problem Solving: 5-Solves complex 90% of the time/cues < 10% of the time  Memory Memory: 5-Recognizes or recalls 90% of the time/requires cueing < 10% of the time  Medical Problem List and Plan:  1. deconditioning after CABG/implantable cardiac defibrillator Per Electrophysiology, will d/c sling, no pulling with L shoulder may use walker, ok for Full PROM and AROM 2. DVT  Prophylaxis/Anticoagulation: Subcutaneous Lovenox. Monitor platelet counts any signs of bleeding  3. Pain Management: Ultram as needed. Monitor with increased mobility  4. Neuropsych: This patient is capable of making decisions on his/her own behalf.  5. Hypertension/recurrent malignant ventricular arrhythmias. Amiodarone 400 mg daily, Lopressor 50 mg twice a day, will reduce  Aldactone 25 mg daily. Monitor with increased mobility, check orthostatics 6. Hyperlipidemia. Zocor  7. Anemia. Continue Niferex. Followup CBC  8. Chronic systolic congestive heart failure. Monitor for any signs of fluid overload  9.  Insomnia not pain or bladder related, does not do well with ambien, will try trazodone     LOS (Days) 3 A FACE TO FACE EVALUATION WAS PERFORMED  KIRSTEINS,ANDREW E 09/11/2011, 9:36 AM

## 2011-09-11 NOTE — Progress Notes (Signed)
Physical Therapy Note  Patient Details  Name: Gabriel Marsh MRN: [id number] Date of Birth: 1930/07/21 Today's Date: 09/11/2011  1500-1555 (55 minutes) group Pain: no complaint of pain Pt participated in PT group session focused on gait training/safety/endurance. Pt ambulates 160 feet X 1 with RW close SBA (distance limited by fatigue); up/down 2 steps with bilateral rails min assist; Nustep ergonometer X 10 minutes with one rest break (percievied exertion = 13 somewhat hard).   [name],JIM 09/11/2011, 8:34 AM

## 2011-09-12 ENCOUNTER — Inpatient Hospital Stay (HOSPITAL_COMMUNITY): Payer: Medicare Other | Admitting: Physical Therapy

## 2011-09-12 ENCOUNTER — Inpatient Hospital Stay (HOSPITAL_COMMUNITY): Payer: Medicare Other

## 2011-09-12 DIAGNOSIS — R5381 Other malaise: Secondary | ICD-10-CM

## 2011-09-12 DIAGNOSIS — Z5189 Encounter for other specified aftercare: Secondary | ICD-10-CM

## 2011-09-12 MED ORDER — GUAIFENESIN 100 MG/5ML PO LIQD: 200.0000 mg | Freq: Four times a day (QID) | ORAL | Status: DC | PRN

## 2011-09-12 MED ORDER — ALPRAZOLAM 0.25 MG PO TABS
0.2500 mg | ORAL_TABLET | Freq: Every evening | ORAL | Status: DC | PRN
  Administered 2011-09-12: 0.25 mg via ORAL
  Filled 2011-09-12: qty 1

## 2011-09-12 MED ORDER — GUAIFENESIN 100 MG/5ML PO SYRP
200.0000 mg | ORAL_SOLUTION | Freq: Four times a day (QID) | ORAL | Status: DC | PRN
  Administered 2011-09-12: 200 mg via ORAL
  Filled 2011-09-12 (×2): qty 118

## 2011-09-12 MED ORDER — GUAIFENESIN 100 MG/5ML PO SOLN
10.0000 mL | Freq: Four times a day (QID) | ORAL | Status: DC | PRN
  Administered 2011-09-13 – 2011-09-16 (×2): 200 mg via ORAL
  Filled 2011-09-12 (×3): qty 10

## 2011-09-12 NOTE — Progress Notes (Signed)
Patient ID: Gabriel Marsh, male   DOB: 08/07/1930, 78 y.o.   MRN: [id number] Patient ID: Gabriel Marsh, male   DOB: 1930-03-25, 79 y.o.   MRN: [id number]  Subjective/Complaints:   Slept poorly but no obvious reason.  Feels he is a bit restless at night. Frequent cough also   Review of Systems  Respiratory: Positive for cough and shortness of breath.   Musculoskeletal: Positive for joint pain.  All other systems reviewed and are negative.    Objective: Vital Signs: Blood pressure 118/68, pulse 82, temperature 97.8 F (36.6 C), temperature source Oral, resp. rate 18, weight 78.155 kg (172 lb 4.8 oz), SpO2 97.00%. No results found. No results found for this or any previous visit (from the past 72 hour(s)).   HEENT: normal Cardio: RRR with systolic murmur Resp: CTA B/L, Left base BS reduced but no rales. Good air movement otherwise GI: BS positive Extremity:  Edema R>L pedal Skin:   Wound sternotomy incison CDI,Neuro: Alert/Oriented and Normal Motor Musc/Skel:  Other decreased ROM L shoulder   Assessment/Plan: 1. Functional deficits secondary to deconditioning after CABG/AVR followed by ICD placement which require 3+ hours per day of interdisciplinary therapy in a comprehensive inpatient rehab setting. Physiatrist is providing close team supervision and 24 hour management of active medical problems listed below. Physiatrist and rehab team continue to assess barriers to discharge/monitor patient progress toward functional and medical goals. FIM: FIM - Bathing Bathing Steps Patient Completed: Chest;Right Arm;Left Arm;Abdomen;Front perineal area;Buttocks (in w/c at sink; elected not to wash feet today) Bathing:  (refused bath)  FIM - Upper Body Dressing/Undressing Upper body dressing/undressing steps patient completed: Thread/unthread right sleeve of pullover shirt/dresss;Thread/unthread left sleeve of pullover shirt/dress;Put head through opening of pull over shirt/dress;Pull shirt over  trunk Upper body dressing/undressing: 5: Supervision: Safety issues/verbal cues FIM - Lower Body Dressing/Undressing Lower body dressing/undressing steps patient completed: Thread/unthread right pants leg;Thread/unthread left pants leg;Pull pants up/down;Don/Doff right sock;Don/Doff left sock;Fasten/unfasten pants (wore pants and socks only today) Lower body dressing/undressing: 5: Supervision: Safety issues/verbal cues  FIM - Toileting Toileting steps completed by patient: Adjust clothing prior to toileting;Performs perineal hygiene;Adjust clothing after toileting Toileting Assistive Devices: Grab bar or rail for support Toileting: 4: Steadying assist  FIM - Diplomatic Services operational officer Devices: Grab bars Toilet Transfers: 2-To toilet/BSC: Max A (lift and lower assist);4-From toilet/BSC: Min A (steadying Pt. > 75%)  FIM - Bed/Chair Transfer Bed/Chair Transfer Assistive Devices: Therapist, occupational: 4: Supine > Sit: Min A (steadying Pt. > 75%/lift 1 leg);4: Sit > Supine: Min A (steadying pt. > 75%/lift 1 leg);4: Bed > Chair or W/C: Min A (steadying Pt. > 75%);4: Chair or W/C > Bed: Min A (steadying Pt. > 75%)  FIM - Locomotion: Wheelchair Distance: 50 Locomotion: Wheelchair: 1: Total Assistance/staff pushes wheelchair (Pt<25%) FIM - Locomotion: Ambulation Locomotion: Ambulation Assistive Devices: Designer, industrial/product Ambulation/Gait Assistance: 5: Supervision Locomotion: Ambulation: 2: Travels 50 - 149 ft with supervision/safety issues  Comprehension Comprehension Mode:  (deaf in left ear and HOH in right) Comprehension: 6-Follows complex conversation/direction: With extra time/assistive device  Expression Expression Mode: Verbal Expression: 6-Expresses complex ideas: With extra time/assistive device  Social Interaction Social Interaction: 6-Interacts appropriately with others with medication or extra time (anti-anxiety, antidepressant).  Problem  Solving Problem Solving: 5-Solves complex 90% of the time/cues < 10% of the time  Memory Memory: 5-Recognizes or recalls 90% of the time/requires cueing < 10% of the time  Medical Problem List and Plan:  1. deconditioning after  CABG/implantable cardiac defibrillator Per Electrophysiology, will d/c sling, no pulling with L shoulder may use walker, ok for Full PROM and AROM 2. DVT Prophylaxis/Anticoagulation: Subcutaneous Lovenox. Monitor platelet counts any signs of bleeding  3. Pain Management: Ultram as needed. Monitor with increased mobility  4. Neuropsych: This patient is capable of making decisions on his/her own behalf.  5. Hypertension/recurrent malignant ventricular arrhythmias. Amiodarone 400 mg daily, Lopressor 50 mg twice a day, will reduce  Aldactone 25 mg daily. Monitor with increased mobility, check orthostatics 6. Hyperlipidemia. Zocor  7. Anemia. Continue Niferex. Followup CBC  8. Chronic systolic congestive heart failure. Monitor for any signs of fluid overload   -minimal cough while i was in room  -robitussin prn  -IS 9.  Insomnia not pain or bladder related, does not do well with ambien, will try trazodone  -prn low dose xanax also     LOS (Days) 4 A FACE TO FACE EVALUATION WAS PERFORMED  Cass Edinger T 09/12/2011, 9:52 AM

## 2011-09-12 NOTE — Progress Notes (Signed)
Occupational Therapy Note  Patient Details  Name: Gabriel Marsh MRN: [id number] Date of Birth: 1930/04/01 Today's Date: 09/12/2011  Time: 1300-1345 Pt denies pain Individual Therapy Pt amb with RW to ADL apartment to engage in toilet transfers, toileting, home mgmt tasks, and furniture transfers.  Pt completed all tasks with supervision and multiple rest breaks.  Pt exhibit SOB with approx 2 mins activity; O2 sats>90% on RA; HR=86 after activity.  Educated pt on RW safety and options for transporting when using RW.  Issued pt a bad for RW to transport items.     [name] [facility] 09/12/2011, 2:37 PM

## 2011-09-12 NOTE — Progress Notes (Signed)
Physical Therapy Session Note  Patient Details  Name: Gabriel Marsh MRN: [id number] Date of Birth: Dec 29, 1930  Today's Date: 09/12/2011 Time: 4782-9562 Time Calculation (min): 30 min  Short Term Goals: Week 1:     Skilled Therapeutic Interventions/Progress Updates:    pt notes feeling better than this am, no c/o fatigue.  Sit to stand S, Amb with RW 160' with S.  Nustep level 3 x73mins.  Amb without AD MinA 100', LOB towards L side x2 requiring MinA to correct.  Side steps at rail with MinGuarding 20' each direction.  Pt needing seated rest breack between each activity, but notes he's feeling better and can see his progress.    Therapy Documentation Precautions:  Precautions Precautions: Fall Precaution Comments: no longer needs O2 for gait.   Restrictions Weight Bearing Restrictions: No  See FIM for current functional status  Therapy/Group: Individual Therapy  Arnulfo Batson, Alison Murray 09/12/2011, 12:01 PM

## 2011-09-12 NOTE — Progress Notes (Signed)
[facility] Individual Statement of Services  Patient Name:  Gabriel Marsh  Date:  09/12/2011  Welcome to the [facility].  Our goal is to provide you with an individualized program based on your diagnosis and situation, designed to meet your specific needs.  With this comprehensive rehabilitation program, you will be expected to participate in at least 3 hours of rehabilitation therapies Monday-Friday, with modified therapy programming on the weekends.  Your rehabilitation program will include the following services:  Physical Therapy (PT), Occupational Therapy (OT), 24 hour per day rehabilitation nursing, Therapeutic Recreaction (TR), Case Management (Social Worker), Rehabilitation Medicine, Nutrition Services and Pharmacy Services  Weekly team conferences will be held on Tuesdays to discuss your progress.  Your  Social Worker will talk with you frequently to get your input and to update you on team discussions.  Team conferences with you and your family in attendance may also be held.  Expected length of stay: 7 - 10 days  Overall anticipated outcome: modified independent  Depending on your progress and recovery, your program may change.  Your  Social Worker will coordinate services and will keep you informed of any changes.  Your  Social Worker's name and contact numbers are listed  below.  The following services may also be recommended but are not provided by the [facility]:   Driving Evaluations  Home Health Rehabiltiation Services  Outpatient Rehabilitatation [facility]  Vocational Rehabilitation   Arrangements will be made to provide these services after discharge if needed.  Arrangements include referral to agencies that provide these services.  Your insurance has been verified to be:  Medicare and BCBS Your primary doctor [facility]:  [facility]  Pertinent information will be shared with your doctor and your  insurance company.    Social Worker:  Seba Dalkai, [place] [phone] or ([phone]  Information discussed with and copy given to patient by: Amada Jupiter, 09/12/2011, 1:06 PM

## 2011-09-12 NOTE — Progress Notes (Signed)
Physical Therapy Session Note  Patient Details  Name: Gabriel Marsh MRN: [id number] Date of Birth: 1930-12-20  Today's Date: 09/12/2011 Time: 0730-0830 Time Calculation (min): 60 min  Short Term Goals: Week 1:     Skilled Therapeutic Interventions/Progress Updates:    pt denies pain.  Pt notes fatigue today and not sleeping well last night.  Amb without RW 140' MinA.  1 LOB to L. Amb with RW no LOB 180' with S.  Seated marching and LAQs with 2# 15reps x2.  Standing step ups 15 x2.  Pt needing rest breaks with all there ex and c/o fatigue.  SPT mat table to W/C S without RW.  PT pushed back to room.    Therapy Documentation Precautions:  Precautions Precautions: Fall Precaution Comments: no longer needs O2 for gait.   Restrictions Weight Bearing Restrictions: No  See FIM for current functional status  Therapy/Group: Individual Therapy  Deari Sessler, Alison Murray 09/12/2011, 8:03 AM

## 2011-09-12 NOTE — Progress Notes (Signed)
Occupational Therapy Session Note  Patient Details  Name: Gabriel Marsh MRN: [id number] Date of Birth: 1930/06/18  Today's Date: 09/12/2011 Time: 1000-1100 Time Calculation (min): 60 min  Short Term Goals: Week 1:  OT Short Term Goal 1 (Week 1): Pt. will be mod Ii with bathing  OT Short Term Goal 2 (Week 1): Pt. will be mod I with dressing OT Short Term Goal 3 (Week 1): Pt. will be mod with grooming OT Short Term Goal 4 (Week 1): Pt. will be mod I with toileting and toilet transfer OT Short Term Goal 5 (Week 1): Pt. will be supervision with shower transfers plus DME  Skilled Therapeutic Interventions/Progress Updates:    Pt in bed resting but agreeable to completing bathing and dressing tasks with sit<>stand from w/c at sink.  Pt required assistance fastening shoes after donning.  Pt required multiple rest breaks throughout session and commented on how quickly he fatigues now.  Discussed energy conservation strategies.  Pt transitioned to completing home mgmt tasks in room at ambulatory level with RW.  Pt required rest break after approx 1 min activity.  Focus on activity tolerance, standing balance, and safety awareness.  Therapy Documentation Precautions:  Precautions Precautions: Fall Precaution Comments: no longer needs O2 for gait.   Restrictions Weight Bearing Restrictions: No  See FIM for current functional status  Therapy/Group: Individual Therapy  Rich Brave 09/12/2011, 12:08 PM

## 2011-09-12 NOTE — Progress Notes (Signed)
Social Work  Social Work Assessment and Plan  Patient Details  Name: Gabriel Marsh MRN: [id number] Date of Birth: [birth year]  Today's Date: 09/12/2011  Problem List:  Patient Active Problem List  Diagnosis  . Benign hypertensive heart disease without heart failure  . Pure hypercholesterolemia  . Severe aortic stenosis  . CAD (coronary artery disease), native coronary artery  . Hypertension  . Hyperlipidemia  . Chronic systolic CHF (congestive heart failure)  . Moderate mitral regurgitation  . Moderate aortic insufficiency  . Cardiogenic shock  . NSTEMI (non-ST elevated myocardial infarction)  . Acute respiratory failure  . Torsades de pointes  . Atrial fibrillation  . Acute on chronic systolic heart failure  . ICD-St.Jude  . Physical deconditioning   Past Medical History:  Past Medical History  Diagnosis Date  . Hypertension   . Hyperlipidemia   . Mitral regurgitation     Moderate by 08/20/11 echo  . Shortness of breath   . CAD (coronary artery disease)     a) 08/22/11 cath :  50-60% left main stenosis, mild-moderate LAD disease, 99% ostial large diagonal stenosis, occluded and collateralized LCx and RCA  . Aortic stenosis     Severe by 08/20/11 echo  . Aortic insufficiency     Moderate by 08/20/11 echo  . Chronic systolic CHF (congestive heart failure)     a) 08/20/11 echo : LVEF 35-40%   Past Surgical History:  Past Surgical History  Procedure Date  . Eye surgery   . Cardiac catheterization 08/22/11     50-60% left main stenosis, mild-moderate LAD disease, 99% ostial large diagonal stenosis, occluded and collateralized LCx and RCA  . Coronary artery bypass graft 08/25/2011    Procedure: CORONARY ARTERY BYPASS GRAFTING (CABG);  Surgeon: Delight Ovens, MD;  Location: [facility] OR;  Service: Open Heart Surgery;  Laterality: N/A;  Hot and humid all through surgery.  . Aortic valve replacement 08/25/2011    Procedure: AORTIC VALVE REPLACEMENT (AVR);  Surgeon: Delight Ovens, MD;  Location[facility];  Service: Open Heart Surgery;  Laterality: N/A;  hot and humid all through surgery.   Social History:  reports that he quit smoking about 16 years ago. He does not have any smokeless tobacco history on file. He reports that he does not drink alcohol or use illicit drugs.  Family / Support Systems Marital Status: Single Patient Roles: Other (Comment) (Uncle) Other Supports: Neices:  Ginette Otto) [name] (H) [phone] or (C) [phone] and  Nettie Elm [facility] @ [phone] Anticipated Caregiver: Rosary Lively Ability/Limitations of Caregiver: None (none) Caregiver Availability: 24/7 Family Dynamics: Pt describes two neices as very supportive and willing to assist in any ways needed.  Social History Preferred language: English Religion: Baptist Cultural Background: NA Education: college grad Read: Yes Write: Yes Employment Status: Retired Date Retired/Disabled/Unemployed: 7 years Age Retired: 74  Fish farm manager Issues: none Guardian/Conservator: Rosary Lively, is pt's POA   Abuse/Neglect Physical Abuse: Denies Verbal Abuse: Denies Sexual Abuse: Denies Exploitation of patient/patient's resources: Denies Self-Neglect: Denies  Emotional Status Pt's affect, behavior adn adjustment status: Pt very pleasant, talkative but HOH (wearing aide) and hopeful his stay here will be brief.  Motivated for therapies and denies any emotional distress.  Depression screen not indicated as needed at this time, however, will monitor. Recent Psychosocial Issues: None Pyschiatric History: None Substance Abuse History: None  Patient / Family Perceptions, Expectations & Goals Pt/Family understanding of illness & functional limitations: Pt and neice with basic understanding of  extent of cardiac issues which led to CABG and of current functional limitations and assist needs. Premorbid pt/family roles/activities: Pt was living alone in [place]  and completely independent at home. Anticipated changes in roles/activities/participation: Pt may need some limited assistance upon d/c, however, would expect a return to his home and independence eventually. Pt/family expectations/goals: "just need to build up my strength"  [employer]: Other (Comment) ([facility] [place] Premorbid Home Care/DME Agencies: None Transportation available at discharge: yes  Discharge Planning Living Arrangements: Alone Support Systems: Other relatives Type of Residence: Private residence Insurance Resources: Administrator (specify) Herbalist) Financial Resources: Restaurant manager, fast food Screen Referred: No Living Expenses: Own Money Management: Patient Do you have any problems obtaining your medications?: No Home Management: Pt Patient/Family Preliminary Plans: Pt plans to initially d/c home with neice here in [place] and follow up with local MDs with eventual return to his own home. Social Work Anticipated Follow Up Needs: HH/OP Expected length of stay: 7-10 Days  Clinical Impression Pleasant, motivated gentleman here after CABG and deconditioned.  Plans to remain local at niece's home upon d/c (pt from [place]).  Denies any emotional distress at this time but will monitor.  Niece able to provide 24/7 care at her home.  Kyran Whittier 09/12/2011, 1:24 PM

## 2011-09-13 ENCOUNTER — Inpatient Hospital Stay (HOSPITAL_COMMUNITY): Payer: Medicare Other

## 2011-09-13 ENCOUNTER — Inpatient Hospital Stay (HOSPITAL_COMMUNITY): Payer: Medicare Other | Admitting: Physical Therapy

## 2011-09-13 NOTE — Progress Notes (Signed)
Physical Therapy Session Note  Patient Details  Name: Gabriel Marsh MRN: [id number] Date of Birth: October 22, 1930  Today's Date: 09/13/2011 Time:  - 0900-0955 (55 minutes) individual    Short Term Goals: Week 1:     Skilled Therapeutic Interventions/Progress Updates: Therapeutic exercises focused on improving tolerance to activity monitoring BP/ HR; gait training/endurance with/without AD; gait training/endurance steps     Therapy Documentation Precautions:  Precautions Precautions: Fall Precaution Comments: no longer needs O2 for gait.   Restrictions Weight Bearing Restrictions: No General: Pt in bed   Vital Signs: Therapy Vitals Temp: 97.9 F (36.6 C) Temp src: Oral Pulse Rate: 75  Resp: 16  BP: 137/57 mmHg Patient Position, if appropriate: Lying Oxygen Therapy SpO2: 100 % O2 Device: None (Room air) Pulse Oximetry Type: Intermittent BP (resting ) 112/69 pulse 76 Oxygen 100% Pain: no complaint of pain   Mobility: sit to stand SBA to independent using UEs on knees   Locomotion : Up/down 4 steps close SBA  Step to step with one rail right or left (pt reports left UE not as strong as right)); oxygen sats post steps 92%  RA  Pulse 74  3/4 dyspnea ; gait -  160 feet RW close SBA ( 3/4 dyspnea / Oxygen sats 98% RA  Pulse 74) ; gait 80 feet min assist without AD (dyspnea 3/4 - 4/4)            Exercises: Nustep (ergonometer) Level 3 X 10 minutes with one rest break ( Oxygen sats post 10 minutes = 97 % RA  Pulse 76  Perceived exertion = 13 somewhat hard    1100-1145 (45 minutes) individual Pain: no complaint of pain Focus of treatment: Bilateral LE strengthening/endurance exercises in supine Treatment: Sit to supine SBA (mat) ; supine to sit with tactile cues for partial roll to left to prevent breaking sternal precautions; bilateral LE strengthening in supine X 20 - ankle pumps, heel slides, hip abduction, SAQs. Pt reports increased cough in supine (used wedge ); pt reports  he is too fatigued to ambulate. See FIM for current functional status  Therapy/Group: Individual Therapy  Asiyah Pineau,JIM 09/13/2011, 7:52 AM

## 2011-09-13 NOTE — Progress Notes (Signed)
Subjective/Complaints:   Perhaps slept a little better last night   Review of Systems  Respiratory: Positive for cough and shortness of breath.   Musculoskeletal: Positive for joint pain.  All other systems reviewed and are negative.    Objective: Vital Signs: Blood pressure 137/57, pulse 75, temperature 97.9 F (36.6 C), temperature source Oral, resp. rate 16, weight 77.3 kg (170 lb 6.7 oz), SpO2 100.00%. No results found. No results found for this or any previous visit (from the past 72 hour(s)).   HEENT: normal Cardio: RRR with systolic murmur Resp: CTA B/L, Left base BS reduced but no rales. Good air movement otherwise. No distressed GI: BS positive Extremity:  Edema R>L pedal Skin:   Wound sternotomy incison CDI ,Neuro: Alert/Oriented and Normal Motor Musc/Skel:  Other decreased ROM L shoulder   Assessment/Plan: 1. Functional deficits secondary to deconditioning after CABG/AVR followed by ICD placement which require 3+ hours per day of interdisciplinary therapy in a comprehensive inpatient rehab setting. Physiatrist is providing close team supervision and 24 hour management of active medical problems listed below. Physiatrist and rehab team continue to assess barriers to discharge/monitor patient progress toward functional and medical goals. FIM: FIM - Bathing Bathing Steps Patient Completed: Chest;Right Arm;Left Arm;Abdomen;Front perineal area;Buttocks;Right upper leg;Left upper leg Bathing: 5: Supervision: Safety issues/verbal cues  FIM - Upper Body Dressing/Undressing Upper body dressing/undressing steps patient completed: Thread/unthread right sleeve of pullover shirt/dresss;Thread/unthread left sleeve of pullover shirt/dress;Put head through opening of pull over shirt/dress;Pull shirt over trunk Upper body dressing/undressing: 5: Supervision: Safety issues/verbal cues FIM - Lower Body Dressing/Undressing Lower body dressing/undressing steps patient completed:  Thread/unthread right pants leg;Thread/unthread left pants leg;Pull pants up/down;Fasten/unfasten pants;Don/Doff right sock;Don/Doff left sock;Don/Doff right shoe;Don/Doff left shoe Lower body dressing/undressing: 4: Min-Patient completed 75 plus % of tasks  FIM - Toileting Toileting steps completed by patient: Adjust clothing prior to toileting;Performs perineal hygiene;Adjust clothing after toileting Toileting Assistive Devices: Grab bar or rail for support Toileting: 5: Supervision: Safety issues/verbal cues  FIM - Diplomatic Services operational officer Devices: Elevated toilet seat;Walker Toilet Transfers: 5-To toilet/BSC: Supervision (verbal cues/safety issues);5-From toilet/BSC: Supervision (verbal cues/safety issues)  FIM - Bed/Chair Transfer Bed/Chair Transfer Assistive Devices: Therapist, occupational: 5: Chair or W/C > Bed: Supervision (verbal cues/safety issues);5: Bed > Chair or W/C: Supervision (verbal cues/safety issues)  FIM - Locomotion: Wheelchair Distance: 50 Locomotion: Wheelchair: 1: Total Assistance/staff pushes wheelchair (Pt<25%) FIM - Locomotion: Ambulation Locomotion: Ambulation Assistive Devices: Other (comment) (No AD) Ambulation/Gait Assistance: 4: Min assist Locomotion: Ambulation: 2: Travels 50 - 149 ft with minimal assistance (Pt.>75%)  Comprehension Comprehension Mode: Auditory Comprehension: 6-Follows complex conversation/direction: With extra time/assistive device  Expression Expression Mode: Verbal Expression: 6-Expresses complex ideas: With extra time/assistive device  Social Interaction Social Interaction: 6-Interacts appropriately with others with medication or extra time (anti-anxiety, antidepressant).  Problem Solving Problem Solving: 5-Solves complex 90% of the time/cues < 10% of the time  Memory Memory: 5-Recognizes or recalls 90% of the time/requires cueing < 10% of the time  Medical Problem List and Plan:  1. deconditioning  after CABG/implantable cardiac defibrillator Per Electrophysiology, will d/c sling, no pulling with L shoulder may use walker, ok for Full PROM and AROM 2. DVT Prophylaxis/Anticoagulation: Subcutaneous Lovenox. Monitor platelet counts any signs of bleeding  3. Pain Management: Ultram as needed. Monitor with increased mobility  4. Neuropsych: This patient is capable of making decisions on his/her own behalf.  5. Hypertension/recurrent malignant ventricular arrhythmias. Amiodarone 400 mg daily, Lopressor 50 mg twice a  day, will reduce  Aldactone 25 mg daily. Monitor with increased mobility, check orthostatics 6. Hyperlipidemia. Zocor  7. Anemia. Continue Niferex. Followup CBC  8. Chronic systolic congestive heart failure. Monitor for any signs of fluid overload  -weights currently stable to decreased  -minimal cough currently  -robitussin prn  -IS 9.  Insomnia not pain or bladder related, does not do well with ambien, prn trazodone  -prn low dose xanax also     LOS (Days) 5 A FACE TO FACE EVALUATION WAS PERFORMED  Gabriel Marsh T 09/13/2011, 6:56 AM

## 2011-09-13 NOTE — Progress Notes (Signed)
Occupational Therapy Session Note  Patient Details  Name: Gabriel Marsh MRN: [id number] Date of Birth: 1930/10/18  Today's Date: 09/13/2011  Session 1 Time: 1000-1100 Time Calculation (min): 60 min  Short Term Goals: Week 1:  OT Short Term Goal 1 (Week 1): Pt. will be mod Ii with bathing  OT Short Term Goal 2 (Week 1): Pt. will be mod I with dressing OT Short Term Goal 3 (Week 1): Pt. will be mod with grooming OT Short Term Goal 4 (Week 1): Pt. will be mod I with toileting and toilet transfer OT Short Term Goal 5 (Week 1): Pt. will be supervision with shower transfers plus DME  Skilled Therapeutic Interventions/Progress Updates:    Pt resting in w/c and agreeable to completing bathing and dressing tasks with sit<>stand from w/c at sink. Pt continues to exhibit decreased activity tolerance and required 5 rest breaks during bathing and dressing. O2 sats>90% on RA.  Pt completed all tasks at supervision level.  Pt amb with RW in room to gather clothing and perform home mgmt tasks.  Discussed energy conservation strategies and importance of taking rest breaks as needed at home until endurance increases.  Therapy Documentation Precautions:  Precautions Precautions: Fall Precaution Comments: no longer needs O2 for gait.   Restrictions Weight Bearing Restrictions: No   Pain: Pain Assessment Pain Assessment: No/denies pain  See FIM for current functional status  Therapy/Group: Individual Therapy  Session 2 Time: 1300-1330 Pt denies pain Individual Therapy Pt engaged in simple meal prep tasks at RW level in kitchen.  Demonstrated RW safety strategies for kitchen activities.  Pt return demonstrated all strategies appropriately.  Pt required rest breaks X 5 during 30 minute session.  Pt stated he was much more tired this afternoon.  Reeducated pt on energy conservation strategies.  Discussed discharge plans.  Pt returned to room and assisted to bed to rest.  Rich Brave 09/13/2011, 11:02 AM

## 2011-09-14 ENCOUNTER — Inpatient Hospital Stay (HOSPITAL_COMMUNITY): Payer: Medicare Other | Admitting: Physical Therapy

## 2011-09-14 ENCOUNTER — Inpatient Hospital Stay (HOSPITAL_COMMUNITY): Payer: Medicare Other

## 2011-09-14 NOTE — Progress Notes (Signed)
Subjective/Complaints:   Feels much better today. Thinks that one of the HS meds is making him groggy.   Review of Systems  Respiratory: Positive for cough and shortness of breath.   Musculoskeletal: Positive for joint pain.  All other systems reviewed and are negative.    Objective: Vital Signs: Blood pressure 122/78, pulse 77, temperature 97.8 F (36.6 C), temperature source Oral, resp. rate 19, weight 77.3 kg (170 lb 6.7 oz), SpO2 98.00%. Dg Chest 2 View  09/13/2011  *RADIOLOGY REPORT*  Clinical Data: Cough, pleural effusions, follow-up  CHEST - 2 VIEW  Comparison: Chest x-ray of 09/08/2011  Findings: There is minimal improved aeration of the lungs.  There is cardiomegaly present with pulmonary vascular congestion and small effusions remaining.  A permanent pacemaker with AICD lead is present.  Right PICC line is noted with the tip seen to the region of the right atrium.  IMPRESSION:  1.  Slightly better aeration.  Persistent CHF with effusions. 2.  Permanent pacemaker. 3.  No change in right PICC line with tip in the region of the right atrium.   Original Report Authenticated By: Juline Patch, M.D.    No results found for this or any previous visit (from the past 72 hour(s)).   HEENT: normal Cardio: RRR with systolic murmur Resp: CTA B/L, Left base BS reduced but no rales. Good air movement otherwise. No distressed GI: BS positive Extremity:  Edema R>L pedal Skin:   Wound sternotomy incison CDI ,Neuro: Alert/Oriented and Normal Motor Musc/Skel:  Other decreased ROM L shoulder   Assessment/Plan: 1. Functional deficits secondary to deconditioning after CABG/AVR followed by ICD placement which require 3+ hours per day of interdisciplinary therapy in a comprehensive inpatient rehab setting. Physiatrist is providing close team supervision and 24 hour management of active medical problems listed below. Physiatrist and rehab team continue to assess barriers to discharge/monitor  patient progress toward functional and medical goals.  Working towards Friday dc. FIM: FIM - Bathing Bathing Steps Patient Completed: Chest;Right Arm;Left Arm;Abdomen;Front perineal area;Buttocks;Right upper leg;Left upper leg;Right lower leg (including foot);Left lower leg (including foot) Bathing: 5: Supervision: Safety issues/verbal cues  FIM - Upper Body Dressing/Undressing Upper body dressing/undressing steps patient completed: Thread/unthread right sleeve of pullover shirt/dresss;Thread/unthread left sleeve of pullover shirt/dress;Put head through opening of pull over shirt/dress;Pull shirt over trunk Upper body dressing/undressing: 5: Supervision: Safety issues/verbal cues FIM - Lower Body Dressing/Undressing Lower body dressing/undressing steps patient completed: Thread/unthread right pants leg;Thread/unthread left pants leg;Pull pants up/down;Fasten/unfasten pants;Don/Doff right sock;Don/Doff left sock;Don/Doff right shoe;Don/Doff left shoe Lower body dressing/undressing: 5: Supervision: Safety issues/verbal cues  FIM - Toileting Toileting steps completed by patient: Adjust clothing prior to toileting;Performs perineal hygiene;Adjust clothing after toileting Toileting Assistive Devices: Grab bar or rail for support Toileting: 5: Supervision: Safety issues/verbal cues  FIM - Diplomatic Services operational officer Devices: Elevated toilet seat;Walker Toilet Transfers: 5-To toilet/BSC: Supervision (verbal cues/safety issues);5-From toilet/BSC: Supervision (verbal cues/safety issues)  FIM - Banker Devices: Therapist, occupational: 6: Sit > Supine: No assist;6: More than reasonable amt of time;6: Supine > Sit: No assist;5: Chair or W/C > Bed: Supervision (verbal cues/safety issues);5: Bed > Chair or W/C: Supervision (verbal cues/safety issues)  FIM - Locomotion: Wheelchair Distance: 50 Locomotion: Wheelchair: 1: Total Assistance/staff  pushes wheelchair (Pt<25%) FIM - Locomotion: Ambulation Locomotion: Ambulation Assistive Devices: Other (comment) (No AD) Ambulation/Gait Assistance: 4: Min assist Locomotion: Ambulation: 5: Travels 150 ft or more with supervision/safety issues  Comprehension Comprehension Mode: Auditory  Comprehension: 6-Follows complex conversation/direction: With extra time/assistive device  Expression Expression Mode: Verbal Expression: 6-Expresses complex ideas: With extra time/assistive device  Social Interaction Social Interaction: 6-Interacts appropriately with others with medication or extra time (anti-anxiety, antidepressant).  Problem Solving Problem Solving: 5-Solves complex 90% of the time/cues < 10% of the time  Memory Memory: 5-Recognizes or recalls 90% of the time/requires cueing < 10% of the time  Medical Problem List and Plan:  1. deconditioning after CABG/implantable cardiac defibrillator Per Electrophysiology, will d/c sling, no pulling with L shoulder may use walker, ok for Full PROM and AROM 2. DVT Prophylaxis/Anticoagulation: Subcutaneous Lovenox. Monitor platelet counts any signs of bleeding  3. Pain Management: Ultram as needed. Monitor with increased mobility  4. Neuropsych: This patient is capable of making decisions on his/her own behalf.  5. Hypertension/recurrent malignant ventricular arrhythmias. Amiodarone 400 mg daily, Lopressor 50 mg twice a day, will reduce  Aldactone 25 mg daily. Monitor with increased mobility, check orthostatics 6. Hyperlipidemia. Zocor  7. Anemia. Continue Niferex. Followup CBC  8. Chronic systolic congestive heart failure. Monitor for any signs of fluid overload  -weights currently stable to decreased  -minimal cough currently  -robitussin prn  -IS 9.  Insomnia not pain or bladder related, does not do well with ambien, will dc trazodone and xanax due to am sedeation     LOS (Days) 6 A FACE TO FACE EVALUATION WAS  PERFORMED  Gabriel Marsh T 09/14/2011, 10:17 AM

## 2011-09-14 NOTE — Progress Notes (Signed)
Occupational Therapy Session Note  Patient Details  Name: Gabriel Marsh MRN: [id number] Date of Birth: 03/24/1930  Today's Date: 09/14/2011  Session 1 Time: 1100-1200 Time Calculation (min): 60 min  Short Term Goals: Week 1:  OT Short Term Goal 1 (Week 1): Pt. will be mod Ii with bathing  OT Short Term Goal 2 (Week 1): Pt. will be mod I with dressing OT Short Term Goal 3 (Week 1): Pt. will be mod with grooming OT Short Term Goal 4 (Week 1): Pt. will be mod I with toileting and toilet transfer OT Short Term Goal 5 (Week 1): Pt. will be supervision with shower transfers plus DME  Skilled Therapeutic Interventions/Progress Updates:    Pt engaged in bathing and dressing tasks w/c level at sink with focus on activity tolerance, standing balance, and safety awareness.  Pt stated that he felf "hung over" this morning and felt like he had less energy today than the previous day.  Pt required rest breaks between each segment of bathing/dressing tasks.  Pt transitioned to ADL bedroom to practice walk-in shower transfers stepping over 4" ledge.  Pt practiced stepping backwards into shower and side stepping into shower.  Pt decided that stepping over backwards into shower would be best for shower layout at home.  Discussed equipment needs and pt stated that niece has a shower stool and BSC for his use after discharge.  Therapy Documentation Precautions:  Precautions Precautions: Fall Precaution Comments:  Restrictions Weight Bearing Restrictions: No   Pain: Pain Assessment Pain Assessment: No/denies pain  See FIM for current functional status  Therapy/Group: Individual Therapy  Session 2 Time: 1400-1430 Pt denies pain Individual therapy Pt amb with RW from room at ADL apt to practice home management tasks while ambulating with RW.  Pt stated he hasn't been sleeping well at night and fatigues quickly with activity.  Pt required rest breaks after approx 1 min activity.  Pt engaged in  retrieving items from closet, drawers, transporting to bathroom, and pulling down covers on bed to lay down in bed.  Pt performed all tasks at supervision level and exhibited no unsafe behavior.  Discussed discharge plans and energy conservation strategies.  [name] [facility] 09/14/2011, 12:06 PM

## 2011-09-14 NOTE — Progress Notes (Signed)
Physical Therapy Session Note  Patient Details  Name: Gabriel Marsh MRN: [id number] Date of Birth: [birth year]  Today's Date: 09/14/2011 Time: 1430-1510 Time Calculation (min): 40 min  Skilled Therapeutic Interventions/Progress Updates:    See below for details.  Pt required multiple rest breaks do to fatigue, but felt at end of session that work on Biodex had strengthening his LEs because he was able to walk back to his room and normally he could not do that.  Therapy Documentation Precautions:  Precautions Precautions: Fall Precaution Comments: no longer needs O2 for gait.   Restrictions Weight Bearing Restrictions: No Pain:  No pain reported Mobility:  Sit to stand transfers with close supervision. Locomotion :  Gait training without RW 80' x 2 with min@.  Gait with RW 100' with supervision.  Balance:  Biodex dynamic balance training with limits of stability and random control.  Initially with UE support then progressing to no support with close supervision.  See FIM for current functional status  Therapy/Group: Individual Therapy  Georges Mouse 09/14/2011, 4:02 PM

## 2011-09-14 NOTE — Progress Notes (Signed)
Physical Therapy Note  Patient Details  Name: Gabriel Marsh MRN: [id number] Date of Birth: [birth year] Today's Date: 09/14/2011  Time: 900-958 58 minutes  No c/o pain. Gait in controlled environment with distant supervision 150' with RW.  Gait with obstacle negotiation with supervision, cues for stepping over obstacles.  Ramp/curb training with RW supervision with cuing for technique.  Pt able to recall technique after repetition.  Standing with RW to retrieve items off of floor with close supervision.  Stair negotiation with supervision with 1 rail to simulate home situation.  Discussed with pt having 2 RW, 1 for each level at home, pt agrees with this recommendation.  Gait training without AD with supervision-min A.  Pt improving balance reactions.  Nu step for LE strength and endurance level 3 x 8 minutes.  Pt reports he was too fatigued to walk back to room at end of session.  Discussed with pt need for w/c for community mobility at this time.  Individual therapy  DONAWERTH,KAREN 09/14/2011, 9:58 AM

## 2011-09-15 ENCOUNTER — Inpatient Hospital Stay (HOSPITAL_COMMUNITY): Payer: Medicare Other

## 2011-09-15 ENCOUNTER — Inpatient Hospital Stay (HOSPITAL_COMMUNITY): Payer: Medicare Other | Admitting: Physical Therapy

## 2011-09-15 NOTE — Patient Care Conference (Signed)
Inpatient RehabilitationTeam Conference Note Date: 09/13/2011   Time: 3:20 PM    Patient Name: Gabriel Marsh      Medical Record Number: [id number]  Date of Birth: 1930-03-30 Sex: Male         Room/Bed: 4149/4149-01 Payor Info: Payor: MEDICARE  Plan: MEDICARE PART A AND B  Product Type: *No Product type*     Admitting Diagnosis: DECONDITIONED  Admit Date/Time:  09/08/2011  3:06 PM Admission Comments: No comment available   Primary Diagnosis:  Physical deconditioning Principal Problem: Physical deconditioning  Patient Active Problem List   Diagnosis Date Noted  . Physical deconditioning 09/09/2011  . ICD-St.Jude 09/08/2011  . Torsades de pointes 09/01/2011  . Atrial fibrillation 09/01/2011  . Acute on chronic systolic heart failure 09/01/2011  . Acute respiratory failure 08/26/2011  . Cardiogenic shock 08/24/2011  . NSTEMI (non-ST elevated myocardial infarction) 08/24/2011  . Severe aortic stenosis 08/23/2011  . CAD (coronary artery disease), native coronary artery 08/23/2011  . Hypertension 08/23/2011  . Hyperlipidemia 08/23/2011  . Chronic systolic CHF (congestive heart failure) 08/23/2011  . Moderate mitral regurgitation 08/23/2011  . Moderate aortic insufficiency 08/23/2011  . Benign hypertensive heart disease without heart failure 02/02/2011  . Pure hypercholesterolemia 02/02/2011    Expected Discharge Date: Expected Discharge Date: 09/16/11  Team Members Present: Physician: Dr. Faith Rogue Social Worker Present: Amada Jupiter, LCSW Nurse Present: Rosalio Macadamia, RN PT Present: Reggy Eye, PT OT Present: Roney Mans, OT;Ardis Rowan, COTA     Current Status/Progress Goal Weekly Team Focus  Medical   s/p cabg and avr. deconditioned. occasional fatgue  improve stamina, rx somatic complaints  see above, chart   Bowel/Bladder   Pt is continent of bowel and bladder  Pt will remain continent      Swallow/Nutrition/ Hydration             ADL's   supervision/min  A overall; fatigues quickly  mod I overall  activity tolerance, safety awareness   Mobility   supervision  mod I  activity tolerance, family ed   Communication   Pt able to verbalize needs         Safety/Cognition/ Behavioral Observations            Pain   Pt denies pain     Continue to monitor   Skin   Incision to chest clean, dry and intact  Incision will heal with out signs and symptoms of infection  Continue to monitor      *See Interdisciplinary Assessment and Plan and progress notes for long and short-term goals  Barriers to Discharge: fatigue    Possible Resolutions to Barriers:  pacing, strength and balance training    Discharge Planning/Teaching Needs:  Plan home with neice who is able to provide 24/7 assistance.  Need to coordinate d[facility]      Team Discussion:  Making good gains and very motivated.  No issues.  Niece supportive.   Need to get d/c meds determined ASAP in order to get from [place] prior to d/c  Revisions to Treatment Plan:  None   Continued Need for Acute Rehabilitation Level of Care: The patient requires daily medical management by a physician with specialized training in physical medicine and rehabilitation for the following conditions: Daily direction of a multidisciplinary physical rehabilitation program to ensure safe treatment while eliciting the highest outcome that is of practical value to the patient.: Yes Daily medical management of patient stability for increased activity during participation in an  intensive rehabilitation regime.: Yes Daily analysis of laboratory values and/or radiology reports with any subsequent need for medication adjustment of medical intervention for : Post surgical problems;Neurological problems;Other  Emmet Messer 09/15/2011, 9:47 AM

## 2011-09-15 NOTE — Progress Notes (Signed)
Physical Therapy Note  Patient Details  Name: Gabriel Marsh MRN: [id number] Date of Birth: 05/31/1930 Today's Date: 09/15/2011  Time: 1000-1055 55 minutes  No c/o pain.  Gait training in controlled environment mod I.  Gait training with obstacle negotiation, simulating home environment on carpet with side stepping, tight spaces with mod I.  Stair negotiation with 1 handrail x 10 stairs with mod I, 1 standing rest break due to fatigue.  Berg balance test performed, pt improved from 23/56 on eval to 37/56.  Pt is now in 80% risk of falls category.  Pt educated on results and continued recommendation to use RW at all times at home.  Pt expresses understanding.  Pt continues to require frequent rest breaks but is improving muscular endurance.  Individual therapy   Albirta Rhinehart 09/15/2011, 10:54 AM

## 2011-09-15 NOTE — Progress Notes (Signed)
Physical Therapy Session Note  Patient Details  Name: Taesean Reth MRN: [id number] Date of Birth: Aug 19, 1930  Today's Date: 09/15/2011 Time: 4098-1191 Time Calculation (min): 38 min  S:  Pt reports that he is feeling better.  Skilled Therapeutic Interventions/Progress Updates:   See below for details. Pt's gait without RW required less assistance and balance seemed improved.  Therapy Documentation Precautions:  Precautions Precautions: Fall Precaution Comments: no longer needs O2 for gait.   Restrictions Weight Bearing Restrictions: No Vital Signs:  Monitored throughout session, O2 consistently at 96-98% and HR even with exercise 76-82. Pain:  No pain Mobility:  Sit to stand with supervision.  Car transfer at pt's truck height with close supervision using RW.  Sit to stand transfers off different chairs/heights with supervision, but with some increased difficulty for the pt at lower heights. Locomotion :  Gait training without assistive device x 80', 60', and 50' with min@. Exercises:  NuStep for LEs only on work load= 3 x 5 min x2, for LE strengthening and endurance training. See FIM for current functional status  Therapy/Group: Individual Therapy  Georges Mouse 09/15/2011, 2:12 PM

## 2011-09-15 NOTE — Progress Notes (Signed)
Subjective/Complaints:   Feels much better. Was able to clear secretions last night and since then coughing much better.   Review of Systems  Respiratory: Positive for cough and shortness of breath.   Musculoskeletal: Positive for joint pain.  All other systems reviewed and are negative.    Objective: Vital Signs: Blood pressure 131/77, pulse 78, temperature 97.5 F (36.4 C), temperature source Oral, resp. rate 19, weight 77.7 kg (171 lb 4.8 oz), SpO2 98.00%. Dg Chest 2 View  09/13/2011  *RADIOLOGY REPORT*  Clinical Data: Cough, pleural effusions, follow-up  CHEST - 2 VIEW  Comparison: Chest x-ray of 09/08/2011  Findings: There is minimal improved aeration of the lungs.  There is cardiomegaly present with pulmonary vascular congestion and small effusions remaining.  A permanent pacemaker with AICD lead is present.  Right PICC line is noted with the tip seen to the region of the right atrium.  IMPRESSION:  1.  Slightly better aeration.  Persistent CHF with effusions. 2.  Permanent pacemaker. 3.  No change in right PICC line with tip in the region of the right atrium.   Original Report Authenticated By: Juline Patch, M.D.    No results found for this or any previous visit (from the past 72 hour(s)).   HEENT: normal Cardio: RRR with systolic murmur Resp: CTA B/L, Left base BS reduced but no rales. Good air movement otherwise. No distressed GI: BS positive Extremity:  Edema R>L pedal Skin:   Wound sternotomy incison CDI ,Neuro: Alert/Oriented and Normal Motor Musc/Skel:  Other decreased ROM L shoulder   Assessment/Plan: 1. Functional deficits secondary to deconditioning after CABG/AVR followed by ICD placement which require 3+ hours per day of interdisciplinary therapy in a comprehensive inpatient rehab setting. Physiatrist is providing close team supervision and 24 hour management of active medical problems listed below. Physiatrist and rehab team continue to assess barriers to  discharge/monitor patient progress toward functional and medical goals.  Working towards [employer]  FIM: FIM - Bathing Bathing Steps Patient Completed: Chest;Right Arm;Left Arm;Abdomen;Front perineal area;Buttocks;Right upper leg;Left upper leg;Left lower leg (including foot);Right lower leg (including foot) Bathing: 6: More than reasonable amount of time  FIM - Upper Body Dressing/Undressing Upper body dressing/undressing steps patient completed: Thread/unthread right sleeve of pullover shirt/dresss;Thread/unthread left sleeve of pullover shirt/dress;Put head through opening of pull over shirt/dress;Pull shirt over trunk Upper body dressing/undressing: 7: Complete Independence: No helper FIM - Lower Body Dressing/Undressing Lower body dressing/undressing steps patient completed: Thread/unthread right pants leg;Thread/unthread left pants leg;Pull pants up/down;Don/Doff right sock;Don/Doff left sock;Don/Doff right shoe;Don/Doff left shoe Lower body dressing/undressing: 6: More than reasonable amount of time  FIM - Toileting Toileting steps completed by patient: Adjust clothing prior to toileting;Performs perineal hygiene;Adjust clothing after toileting Toileting Assistive Devices: Grab bar or rail for support Toileting: 6: Assistive device: No helper  FIM - Diplomatic Services operational officer Devices: Bedside commode;Elevated toilet seat Toilet Transfers: 6-To toilet/ BSC;6-From toilet/BSC  FIM - Banker Devices: Therapist, occupational: 6: Sit > Supine: No assist;6: Supine > Sit: No assist;6: Bed > Chair or W/C: No assist;6: Chair or W/C > Bed: No assist;6: More than reasonable amt of time  FIM - Locomotion: Wheelchair Distance: 50 Locomotion: Wheelchair: 0: Activity did not occur FIM - Locomotion: Ambulation Locomotion: Ambulation Assistive Devices: Designer, industrial/product Ambulation/Gait Assistance: 5: Supervision Locomotion:  Ambulation: 6: Travels 150 ft or more with assistive device/no helper  Comprehension Comprehension Mode: Auditory Comprehension: 6-Follows complex conversation/direction: With extra time/assistive  device  Expression Expression Mode: Verbal Expression: 6-Expresses complex ideas: With extra time/assistive device  Social Interaction Social Interaction: 6-Interacts appropriately with others with medication or extra time (anti-anxiety, antidepressant).  Problem Solving Problem Solving: 5-Solves basic 90% of the time/requires cueing < 10% of the time  Memory Memory: 5-Recognizes or recalls 90% of the time/requires cueing < 10% of the time  Medical Problem List and Plan:  1. deconditioning after CABG/implantable cardiac defibrillator Per Electrophysiology, will d/c sling, no pulling with L shoulder may use walker, ok for Full PROM and AROM 2. DVT Prophylaxis/Anticoagulation: Subcutaneous Lovenox. Monitor platelet counts any signs of bleeding  3. Pain Management: Ultram as needed. Monitor with increased mobility  4. Neuropsych: This patient is capable of making decisions on his/her own behalf.  5. Hypertension/recurrent malignant ventricular arrhythmias. Amiodarone 400 mg daily, Lopressor 50 mg twice a day, will reduce  Aldactone 25 mg daily. Monitor with increased mobility, check orthostatics 6. Hyperlipidemia. Zocor  7. Anemia. Continue Niferex. Followup CBC  8. Chronic systolic congestive heart failure. Monitor for any signs of fluid overload  -weights currently stable to decreased  -minimal cough currently. Occasionally he's mobilizing secretions from his chest. Secretions are much decreased.  -robitussin prn  -IS 9.  Insomnia not pain or bladder related, does not do well with ambien, will dc trazodone and xanax due to am sedeation     LOS (Days) 7 A FACE TO FACE EVALUATION WAS PERFORMED  Sidney Silberman T 09/15/2011, 12:27 PM

## 2011-09-15 NOTE — Progress Notes (Signed)
Occupational Therapy Session Note  Patient Details  Name: Gabriel Marsh MRN: [id number] Date of Birth: May 29, 1930  Today's Date: 09/15/2011  Session 1 Time: 0900-1000 Time Calculation (min): 60 min  Short Term Goals: Week 1:  OT Short Term Goal 1 (Week 1): Pt. will be mod Ii with bathing  OT Short Term Goal 2 (Week 1): Pt. will be mod I with dressing OT Short Term Goal 3 (Week 1): Pt. will be mod with grooming OT Short Term Goal 4 (Week 1): Pt. will be mod I with toileting and toilet transfer OT Short Term Goal 5 (Week 1): Pt. will be supervision with shower transfers plus DME  Skilled Therapeutic Interventions/Progress Updates:    Pt engaged in bathing and dressing tasks w/c level at sink.  Pt declined shower, not wanted to risk getting PICC line wet or surgical site on chest even though therapist explained those areas would be covered with protective plastic.  Pt amb with RW in room to gather clothing before completing tasks at sink with sit<>stand from w/c.  Pt completed all tasks at mod I/I level with multiple rest breaks secondary to fatigue.  Pt incorporated appropriate energy conservation strategies throughout session.  Pt transitioned to gym to participate in functional amb tasks and standing activities to increase activity tolerance and standing balance.   Therapy Documentation Precautions:  Precautions Precautions: Fall Precaution Comments: no longer needs O2 for gait.   Restrictions Weight Bearing Restrictions: No Pain Assessment Pain Assessment: No/denies pain    See FIM for current functional status  Therapy/Group: Individual Therapy  Session 2 Time: 1430-1505 Individual Therapy Pt engaged in standing balance activities with Wii.  Pt required min A for balance reactions on Wii balance board.  Pt requested to use toilet and returned to room.  Pt's niece present when pt returned to room.  Discussed with niece bathroom set up and safety recommendations for home.  Pt  performed toilet transfer and toileting at mod I level.  Pt made mod I in room; staff notified and safety plan updated.   Gabriel Marsh [facility] 09/15/2011, 10:03 AM

## 2011-09-15 NOTE — Progress Notes (Signed)
Discharge summary job [id number]

## 2011-09-15 NOTE — Discharge Summary (Signed)
NAME:  Gabriel Marsh, Gabriel Marsh NO.:  [id number]  MEDICAL RECORD NO.:  [id number]  LOCATION:  4149                         FACILITY:  [facility]  PHYSICIAN:  Ranelle Oyster, M.D.DATE OF BIRTH:  [birth year]  DATE OF ADMISSION:  09/08/2011 DATE OF DISCHARGE:                              DISCHARGE SUMMARY   DISCHARGE DIAGNOSES: 1. Deconditioning after coronary artery bypass graft with implantable     cardioverter-defibrillator. 2. Subcutaneous Lovenox for deep venous thrombosis prophylaxis. 3. Pain management. 4. Hypertension with recurrent malignant ventricular arrhythmias. 5. Hyperlipidemia. 6. Anemia. 7. Chronic congestive heart failure.  HISTORY:  This is a 78 year old right-handed male with history of coronary artery disease and severe aortic stenosis as well as congestive heart failure, who was admitted on August 23, 2011, with pulmonary edema and acute onset of systolic dysfunction.  Cardiac enzymes were positive. The patient with nonspecific chest pain to the right shoulder. Echocardiogram revealed critical aortic stenosis with severe left ventricular dysfunction and ejection fraction of 25%.  A cardiac catheterization was completed revealing severe three-vessel disease. Underwent coronary artery bypass grafting x3 with right leg vein harvesting and aortic valve replacement on August 29, 2011, per Dr. Tyrone Sage.  Placed on aspirin therapy as well as subcutaneous Lovenox. Postoperative atrial fibrillation, maintained on amiodarone.  An ICD procedure was completed by Cardiology Services on September 06, 2011, without postoperative complications.  The patient with profound deconditioning and was admitted for comprehensive rehab program.  PAST MEDICAL HISTORY:  See discharge diagnoses.  SOCIAL HISTORY:  Lives with family and assistance as needed.  FUNCTIONAL HISTORY PRIOR TO ADMISSION:  Independent.  FUNCTIONAL STATUS UPON ADMISSION TO REHAB SERVICES:  Minimum to  moderate- assist overall for functional mobility.  PHYSICAL EXAMINATION:  VITAL SIGNS:  Blood pressure 113/75, pulse 74, respirations 18, temperature 97.5. GENERAL:  This was an alert male, in no acute distress, oriented x3. LUNGS:  Clear to auscultation. CARDIAC:  Rate controlled. ABDOMEN:  Soft, nontender.  Good bowel sounds. CHEST:  Incision well approximated.  ICD placement, dressing clean and dry. NEUROLOGIC:  The patient could move all extremities.  REHABILITATION HOSPITAL COURSE:  The patient was admitted to Inpatient Rehab Services with therapies initiated on a 3-hour daily basis consisting of physical therapy, occupational therapy, and rehabilitation nursing with following issues were addressed during the patient's rehabilitation stay.  Pertaining to [name] deconditioning after CABG, implantable cardioverter-defibrillator remained stable.  No chest pain or shortness of breath.  He would follow up with Dr. Tyrone Sage as well as Cardiology Services.  He remained on aspirin therapy as well as subcutaneous Lovenox for DVT prophylaxis throughout his rehab course. Blood pressure is well controlled on amiodarone as well as metoprolol with Aldactone.  The patient currently on no diuretics other than Aldactone.  He had completed a 7-day course per discharge summary of cardiothoracic surgery for his Lasix.  Latest chest x-ray on September 13, 2011, revealed better aeration.  He continued on Zocor for hyperlipidemia.  He exhibited no signs of fluid overload.  The patient received weekly collaborative interdisciplinary team conferences to discuss estimated length of stay, family teaching, and any barriers to discharge.  He was continent of bowel and bladder.  His strength and endurance greatly improved throughout his rehab course.  He was ambulating extended distances.  He did show some mild memory deficits with good awareness of his safety.  He was calling for staff for assistance as  needed.  He was applying his sternal precautions.  Full family teaching was completed and plan was to be discharged to home.  DISCHARGE MEDICATIONS AT TIME OF DICTATION: 1. Amiodarone 400 mg daily. 2. Aspirin 325 mg daily. 3. Folic acid 1 mg daily. 4. Niferex 150 mg daily. 5. Lopressor 25 mg twice daily. 6. Zocor 5 mg daily. 7. Aldactone 25 mg daily. 8. Ultram 50 mg every 6 hours as needed pain.  DIET:  Regular.  SPECIAL INSTRUCTIONS:  Sternal precautions.  Home health therapies had been arranged.  The patient was to follow up Dr. Tyrone Sage, Cardiothoracic Surgery, call for appointment; Dr. Johney Frame, Cardiology Services; Dr. Faith Rogue as needed.  Discussed with family the need to follow up with primary care provider for ongoing medical management.     Mariam Dollar, P.A.   ______________________________ Ranelle Oyster, M.D.    DA/MEDQ  D:  09/15/2011  T:  09/15/2011  Job:  [id number]  cc:   Sheliah Plane, MD Hillis Range, MD

## 2011-09-15 NOTE — Progress Notes (Signed)
Social Work Patient ID: Gabriel Marsh, male   DOB: 02-05-1930, 78 y.o.   MRN: [id number]  Have spoken with pt and niece over past few days to review team conference and make d/c planning arrangements.  Both agreeable with target d/c 09/16/11.  Have coordinated d/[facility] Rush Landmark) and niece to go to [place] to pick up prior to d/c.  Pt continues to plan to d/c to niece'  Home for "a couple of weeks" then return to his home in [place].  Weda Baumgarner

## 2011-09-15 NOTE — Progress Notes (Signed)
Physical Therapy Discharge Summary  Patient Details  Name: Gabriel Marsh MRN: [id number] Date of Birth: [birth year]  Today's Date: 09/15/2011  Patient has met 7 of 7 long term goals due to improved activity tolerance, improved balance, increased strength and ability to compensate for deficits.  Patient to discharge at an ambulatory level Modified Independent.     Reasons goals not met: n/a  Recommendation:  Patient will benefit from ongoing skilled PT services in home health setting to continue to advance safe functional mobility, address ongoing impairments in activity tolerance, balance, gait, strength and minimize fall risk.  Equipment: w/c, RW  Reasons for discharge: treatment goals met and discharge from hospital  Patient/family agrees with progress made and goals achieved: Yes  PT Discharge Pain Pain Assessment Pain Assessment: No/denies pain  Overall Cognitive Status: Appears within functional limits for tasks assessed Sensation Sensation Light Touch: Appears Intact Proprioception: Appears Intact Coordination Gross Motor Movements are Fluid and Coordinated: Yes Fine Motor Movements are Fluid and Coordinated: Yes Motor  Motor Motor: Within Functional Limits     Trunk/Postural Assessment  Cervical Assessment Cervical Assessment: Within Functional Limits Thoracic Assessment Thoracic Assessment: Within Functional Limits Lumbar Assessment Lumbar Assessment: Within Functional Limits Postural Control Postural Control: Within Functional Limits  Balance Berg Balance Test Sit to Stand: Able to stand without using hands and stabilize independently Standing Unsupported: Able to stand safely 2 minutes Sitting with Back Unsupported but Feet Supported on Floor or Stool: Able to sit safely and securely 2 minutes Stand to Sit: Sits safely with minimal use of hands Transfers: Able to transfer safely, minor use of hands Standing Unsupported with Eyes Closed: Needs help to keep  from falling Standing Ubsupported with Feet Together: Needs help to attain position but able to stand for 30 seconds with feet together From Standing, Reach Forward with Outstretched Arm: Can reach forward >12 cm safely (5") From Standing Position, Pick up Object from Floor: Able to pick up shoe, needs supervision From Standing Position, Turn to Look Behind Over each Shoulder: Turn sideways only but maintains balance Turn 360 Degrees: Able to turn 360 degrees safely but slowly Standing Unsupported, Alternately Place Feet on Step/Stool: Able to stand independently and complete 8 steps >20 seconds Standing Unsupported, One Foot in Front: Able to take small step independently and hold 30 seconds Standing on One Leg: Tries to lift leg/unable to hold 3 seconds but remains standing independently Total Score: 37  Extremity Assessment      RLE Assessment RLE Assessment: Within Functional Limits LLE Assessment LLE Assessment: Within Functional Limits  See FIM for current functional status  Gabriel Marsh 09/15/2011, 10:43 AM

## 2011-09-15 NOTE — Progress Notes (Signed)
Occupational Therapy Discharge Summary  Patient Details  Name: Gabriel Marsh MRN: [id number] Date of Birth: 1931/01/02  Today's Date: 09/15/2011 Time: 6295-2841 Time Calculation (min): 35 min  Patient has met 8 of 8 long term goals due to improved activity tolerance and improved balance.  Pt made excellent gains in bathing, dressing, and home mgmt tasks this admission.  Pt is mod I/I for bathing, dressing, simple kitchen tasks, toilet transfers, and toileting; pt is supervision for walk-in shower transfers.  Pt continues to fatigue quickly but incorporates appropriate energy conservation strategies.  Pt will d/c to niece's home before transitioning to home in [place]. Patient to discharge at overall Modified Independent level.  Patient's care partner is independent to provide the necessary physical assistance at discharge.     Recommendation:  Patient will benefit from ongoing skilled OT services in home health setting to continue to advance functional skills in the area of BADL.  Equipment: No equipment provided Patient has access to [facility] and shower seat  Reasons for discharge: treatment goals met and discharge from hospital  Patient/family agrees with progress made and goals achieved: Yes  OT Discharge   ADL ADL Eating: Independent Where Assessed-Eating: Chair Grooming: Independent Where Assessed-Grooming: Sitting at sink Upper Body Bathing: Modified independent Where Assessed-Upper Body Bathing: Sitting at sink Lower Body Bathing: Modified independent Where Assessed-Lower Body Bathing: Sitting at sink;Standing at sink Upper Body Dressing: Independent Where Assessed-Upper Body Dressing: Sitting at sink Lower Body Dressing: Modified independent Where Assessed-Lower Body Dressing: Standing at sink;Sitting at sink Toileting: Modified independent Where Assessed-Toileting: Neurosurgeon Method: Proofreader:  Raised Copy: Modified independent Film/video editor Method: Designer, industrial/product: Information systems manager without back Vision/Perception  Vision - History Baseline Vision: Wears glasses all the time Patient Visual Report: No change from baseline  Cognition Overall Cognitive Status: Appears within functional limits for tasks assessed Arousal/Alertness: Awake/alert Orientation Level: Oriented X4 Attention: Selective Selective Attention: Appears intact Memory: Appears intact Awareness: Appears intact Problem Solving: Appears intact Sensation Sensation Light Touch: Appears Intact Coordination Gross Motor Movements are Fluid and Coordinated: Yes Fine Motor Movements are Fluid and Coordinated: Yes    Trunk/Postural Assessment  Cervical Assessment Cervical Assessment: Within Functional Limits Thoracic Assessment Thoracic Assessment: Within Functional Limits Lumbar Assessment Lumbar Assessment: Within Functional Limits Postural Control Postural Control: Within Functional Limits    Extremity/Trunk Assessment RUE Assessment RUE Assessment: Within Functional Limits LUE Assessment LUE Assessment: Within Functional Limits  See FIM for current functional status  Rich Brave 09/15/2011, 3:53 PM

## 2011-09-16 MED ORDER — SIMVASTATIN 5 MG PO TABS: 5.0000 mg | ORAL_TABLET | Freq: Every day | ORAL | Status: DC

## 2011-09-16 MED ORDER — SPIRONOLACTONE 25 MG PO TABS: 25.0000 mg | ORAL_TABLET | Freq: Every day | ORAL | Status: DC

## 2011-09-16 MED ORDER — TRAMADOL HCL 50 MG PO TABS: 50.0000 mg | ORAL_TABLET | Freq: Four times a day (QID) | ORAL | Status: DC | PRN

## 2011-09-16 MED ORDER — METOPROLOL TARTRATE 25 MG PO TABS: 25.0000 mg | ORAL_TABLET | Freq: Two times a day (BID) | ORAL | Status: AC

## 2011-09-16 MED ORDER — POLYSACCHARIDE IRON COMPLEX 150 MG PO CAPS: 150.0000 mg | ORAL_CAPSULE | Freq: Every day | ORAL | Status: DC

## 2011-09-16 MED ORDER — AMIODARONE HCL 400 MG PO TABS: 400.0000 mg | ORAL_TABLET | Freq: Every day | ORAL | Status: DC

## 2011-09-16 MED ORDER — FOLIC ACID 1 MG PO TABS: 1.0000 mg | ORAL_TABLET | Freq: Every day | ORAL | Status: AC

## 2011-09-16 NOTE — Progress Notes (Signed)
Patient ID: Gabriel Marsh, male   DOB: 1930-04-23, 78 y.o.   MRN: [id number]                   [address]            [place] [address]          [phone]          LOS: 8 days   Subjective: Feels well, has made very god progress in rehab  Objective: Vital signs in last 24 hours: Patient Vitals for the past 24 hrs:  BP Temp Temp src Pulse Resp SpO2 Weight  09/16/11 0738 128/80 mmHg - - 80  - - -  09/16/11 0507 110/73 mmHg - - 79  18  94 % -  09/16/11 0505 112/72 mmHg - - 87  18  98 % -  09/16/11 0500 109/69 mmHg 98.5 F (36.9 C) Oral 81  18  96 % 174 lb 9.7 oz (79.2 kg)  09/15/11 1848 108/69 mmHg 98.5 F (36.9 C) Oral 84  18  99 % -    Filed Weights   09/14/11 1100 09/15/11 0507 09/16/11 0500  Weight: 172 lb 6.4 oz (78.2 kg) 171 lb 4.8 oz (77.7 kg) 174 lb 9.7 oz (79.2 kg)    Hemodynamic parameters for last 24 hours:    Intake/Output from previous day: 08/22 0701 - 08/23 0700 In: 960 [P.O.:960] Out: 1200 [Urine:1200] Intake/Output this shift: Total I/O In: 360 [P.O.:360] Out: -   Scheduled Meds:   . amiodarone  400 mg Oral Daily  . aspirin EC  325 mg Oral Daily  . enoxaparin  30 mg Subcutaneous Q24H  . feeding supplement  1 Container Oral TID BM  . folic acid  1 mg Oral Daily  . iron polysaccharides  150 mg Oral Daily  . metoprolol tartrate  25 mg Oral BID  . pantoprazole  40 mg Oral QAC breakfast  . simvastatin  5 mg Oral q1800  . spironolactone  25 mg Oral Daily   Continuous Infusions:  PRN Meds:.acetaminophen, guaiFENesin, levalbuterol, lidocaine, ondansetron (ZOFRAN) IV, ondansetron, senna-docusate, sodium chloride, sorbitol, traMADol  General appearance: alert, cooperative and no distress Neurologic: intact Heart: regular rate and rhythm, S1, S2 normal, no murmur, click, rub or gallop and normal apical impulse Lungs: clear to auscultation bilaterally and normal percussion bilaterally Abdomen: soft, non-tender; bowel sounds normal;  no masses,  no organomegaly Wound: sternum stable, ICD site intact  Lab Results: CBC:No results found for this basename: WBC:2,HGB:2,HCT:2,PLT:2 in the last 72 hours BMET: No results found for this basename: NA:2,K:2,CL:2,CO2:2,GLUCOSE:2,BUN:2,CREATININE:2,CALCIUM:2 in the last 72 hours  PT/INR: No results found for this basename: LABPROT,INR in the last 72 hours   Radiology Dg Chest 2 View  09/13/2011  *RADIOLOGY REPORT*  Clinical Data: Cough, pleural effusions, follow-up  CHEST - 2 VIEW  Comparison: Chest x-ray of 09/08/2011  Findings: There is minimal improved aeration of the lungs.  There is cardiomegaly present with pulmonary vascular congestion and small effusions remaining.  A permanent pacemaker with AICD lead is present.  Right PICC line is noted with the tip seen to the region of the right atrium.  IMPRESSION:  1.  Slightly better aeration.  Persistent CHF with effusions. 2.  Permanent pacemaker. 3.  No change in right PICC line with tip in the region of the right atrium.   Original Report Authenticated By: Juline Patch, M.D.    pacities, atelectasis versus infiltrate.  Original Report Authenticated  By: Waynard Reeds, M.D.   Assessment/Plan: S/P  CABG/ AVR AICD Home today per rehab Follow up with cardiac surgery 3 weeks, discussed with patient and niece    Delight Ovens MD 09/16/2011 8:52 AM

## 2011-09-16 NOTE — Progress Notes (Signed)
Pt discharged home with niece, Dan Finland PA provided discharge instructions and prescriptions, Pt and niece denied any questions or concerns, pt discharged to private vehicle

## 2011-09-16 NOTE — Progress Notes (Signed)
Subjective/Complaints:   Intermittent cough.   Review of Systems  Respiratory: Positive for cough and shortness of breath.   Musculoskeletal: Positive for joint pain.  All other systems reviewed and are negative.    Objective: Vital Signs: Blood pressure 110/73, pulse 79, temperature 98.5 F (36.9 C), temperature source Oral, resp. rate 18, weight 79.2 kg (174 lb 9.7 oz), SpO2 94.00%. No results found. No results found for this or any previous visit (from the past 72 hour(s)).   HEENT: normal Cardio: RRR with systolic murmur Resp: CTA B/L, Left base BS reduced but no rales. Good air movement otherwise. No distressed. Scattered rhonchi GI: BS positive Extremity:  Edema R>L pedal Skin:   Wound sternotomy incison CDI ,Neuro: Alert/Oriented and Normal Motor Musc/Skel:  Other decreased ROM L shoulder   Assessment/Plan: 1. Functional deficits secondary to deconditioning after CABG/AVR followed by ICD placement which require 3+ hours per day of interdisciplinary therapy in a comprehensive inpatient rehab setting. Physiatrist is providing close team supervision and 24 hour management of active medical problems listed below. Physiatrist and rehab team continue to assess barriers to discharge/monitor patient progress toward functional and medical goals.  DC today  FIM: FIM - Bathing Bathing Steps Patient Completed: Chest;Right Arm;Left Arm;Abdomen;Front perineal area;Buttocks;Right upper leg;Left upper leg;Left lower leg (including foot);Right lower leg (including foot) Bathing: 6: More than reasonable amount of time  FIM - Upper Body Dressing/Undressing Upper body dressing/undressing steps patient completed: Thread/unthread right sleeve of pullover shirt/dresss;Thread/unthread left sleeve of pullover shirt/dress;Put head through opening of pull over shirt/dress;Pull shirt over trunk Upper body dressing/undressing: 7: Complete Independence: No helper FIM - Lower Body  Dressing/Undressing Lower body dressing/undressing steps patient completed: Thread/unthread right pants leg;Thread/unthread left pants leg;Pull pants up/down;Don/Doff right sock;Don/Doff left sock;Don/Doff right shoe;Don/Doff left shoe Lower body dressing/undressing: 6: More than reasonable amount of time  FIM - Toileting Toileting steps completed by patient: Adjust clothing prior to toileting;Performs perineal hygiene;Adjust clothing after toileting Toileting Assistive Devices: Grab bar or rail for support Toileting: 6: Assistive device: No helper  FIM - Diplomatic Services operational officer Devices: Bedside commode;Elevated toilet seat Toilet Transfers: 6-To toilet/ BSC;6-From toilet/BSC  FIM - Banker Devices: Therapist, occupational: 6: Sit > Supine: No assist;6: Supine > Sit: No assist;6: Bed > Chair or W/C: No assist;6: Chair or W/C > Bed: No assist;6: More than reasonable amt of time  FIM - Locomotion: Wheelchair Distance: 50 Locomotion: Wheelchair: 0: Activity did not occur FIM - Locomotion: Ambulation Locomotion: Ambulation Assistive Devices: Designer, industrial/product Ambulation/Gait Assistance: 5: Supervision Locomotion: Ambulation: 6: Travels 150 ft or more with assistive device/no helper  Comprehension Comprehension Mode: Auditory Comprehension: 6-Follows complex conversation/direction: With extra time/assistive device  Expression Expression Mode: Verbal Expression: 6-Expresses complex ideas: With extra time/assistive device  Social Interaction Social Interaction: 6-Interacts appropriately with others with medication or extra time (anti-anxiety, antidepressant).  Problem Solving Problem Solving: 5-Solves complex 90% of the time/cues < 10% of the time  Memory Memory: 5-Recognizes or recalls 90% of the time/requires cueing < 10% of the time  Medical Problem List and Plan:  1. deconditioning after CABG/implantable cardiac  defibrillator Per Electrophysiology, will d/c sling, no pulling with L shoulder may use walker, ok for Full PROM and AROM 2. DVT Prophylaxis/Anticoagulation: Subcutaneous Lovenox. Monitor platelet counts any signs of bleeding  3. Pain Management: Ultram as needed. Monitor with increased mobility  4. Neuropsych: This patient is capable of making decisions on his/her own behalf.  5. Hypertension/recurrent  malignant ventricular arrhythmias. Amiodarone 400 mg daily, Lopressor 50 mg twice a day, will reduce  Aldactone 25 mg daily.  6. Hyperlipidemia. Zocor  7. Anemia. Continue Niferex. Followup CBC  8. Chronic systolic congestive heart failure. Monitor for any signs of fluid overload  -weights remain stable to decreased  -minimal cough currently. Occasionally he's mobilizing secretions from his chest. Secretions are much decreased.  -robitussin prn can be used at home.  -IS and FV (his valve appears to have materail caked inside, preventing proper function) 9.  Insomnia -cough rx     LOS (Days) 8 A FACE TO FACE EVALUATION WAS PERFORMED  Emunah Texidor T 09/16/2011, 7:23 AM

## 2011-09-16 NOTE — Progress Notes (Signed)
Social Work  Discharge Note  The overall goal for the admission was met for:   Discharge location: Yes  Length of Stay: Yes - 8 days  Discharge activity level: Yes - modified independent  Home/community participation: Yes  Services provided included: MD, RD, PT, OT, RN, CM, TR, Pharmacy and SW  Financial Services: Medicare and Private Insurance: BCBS  Follow-up services arranged: Home Health: RN, PT, OT via [facility], DME: 18x18 [employer]reezy w/c with cushion, rolling walker via [facility] and Patient/Family has no preference for HH/DME agencies  Comments (or additional information):  Patient/Family verbalized understanding of follow-up arrangements: Yes  Individual responsible for coordination of the follow-up plan: patient  Confirmed correct DME delivered: Gabriel Marsh 09/16/2011    Gabriel Marsh

## 2011-09-21 ENCOUNTER — Ambulatory Visit (INDEPENDENT_AMBULATORY_CARE_PROVIDER_SITE_OTHER): Payer: Medicare Other | Admitting: *Deleted

## 2011-09-21 ENCOUNTER — Ambulatory Visit (INDEPENDENT_AMBULATORY_CARE_PROVIDER_SITE_OTHER): Payer: Medicare Other | Admitting: Cardiology

## 2011-09-21 ENCOUNTER — Encounter: Payer: Self-pay | Admitting: Cardiology

## 2011-09-21 ENCOUNTER — Encounter: Payer: Self-pay | Admitting: Internal Medicine

## 2011-09-21 VITALS — BP 110/70 | HR 80 | Ht 70.0 in | Wt 160.0 lb

## 2011-09-21 DIAGNOSIS — Z95810 Presence of automatic (implantable) cardiac defibrillator: Secondary | ICD-10-CM

## 2011-09-21 DIAGNOSIS — I4901 Ventricular fibrillation: Secondary | ICD-10-CM

## 2011-09-21 DIAGNOSIS — Z952 Presence of prosthetic heart valve: Secondary | ICD-10-CM

## 2011-09-21 DIAGNOSIS — Z951 Presence of aortocoronary bypass graft: Secondary | ICD-10-CM

## 2011-09-21 DIAGNOSIS — R531 Weakness: Secondary | ICD-10-CM

## 2011-09-21 DIAGNOSIS — R5381 Other malaise: Secondary | ICD-10-CM

## 2011-09-21 DIAGNOSIS — I5023 Acute on chronic systolic (congestive) heart failure: Secondary | ICD-10-CM

## 2011-09-21 DIAGNOSIS — R570 Cardiogenic shock: Secondary | ICD-10-CM

## 2011-09-21 DIAGNOSIS — Z954 Presence of other heart-valve replacement: Secondary | ICD-10-CM

## 2011-09-21 LAB — ICD DEVICE OBSERVATION
AL IMPEDENCE ICD: 450 Ohm
ATRIAL PACING ICD: 0.04 pct
BAMS-0003: 70 {beats}/min
DEV-0020ICD: NEGATIVE
FVT: 0
RV LEAD AMPLITUDE: 11.8 mv
RV LEAD IMPEDENCE ICD: 425 Ohm
TOT-0007: 2
TOT-0008: 0
TOT-0009: 1
TZAT-0001SLOWVT: 1
TZAT-0004SLOWVT: 8
TZAT-0012SLOWVT: 200 ms
TZAT-0013SLOWVT: 2
TZAT-0018SLOWVT: NEGATIVE
TZAT-0019SLOWVT: 7.5 V
TZAT-0020SLOWVT: 1 ms
TZON-0003SLOWVT: 325 ms
TZON-0004SLOWVT: 30
TZON-0005SLOWVT: 6
TZST-0001SLOWVT: 3
TZST-0001SLOWVT: 4
TZST-0003SLOWVT: 875 V
VF: 0

## 2011-09-21 NOTE — Progress Notes (Signed)
[name] Date of Birth:  Apr 03, 1930 [facility] [address] [place], [place]  [address] [phone]         Fax   [phone]  History of Present Illness: This pleasant 78 year old gentleman from [facility] is seen for a post hospital visit the patient had had known severe aortic stenosis.  He became more symptomatic in late July 2013 and presented to his local hospital and pulmonary edema.  Her catheterization there showed severe aortic stenosis and severe ischemic heart disease and was transferred to [facility] where he underwent aortic valve replacement with a bovine tissue valve as well as coronary artery bypass graft surgery on 08/25/11 by Dr. Tyrone Sage.  The patient had a difficult postoperative course complicated by congestive heart failure and runs of ventricular fibrillation.  Prior to discharge a ICD was implanted.  The patient was discharged from the hospital 5 days ago and has been staying with his niece who lives in [place] he has been doing well.  He is not having any symptoms of CHF.  He's had no shocks from his defibrillator.  His defibrillator was checked today and is working well he has continued to lose weight and is now down to dry weight and is on a diuretic is spironolactone 25 mg daily.  Since we last saw him in the office in January 2013 he has lost 25 pounds.  Current Outpatient Prescriptions  Medication Sig Dispense Refill  . amiodarone (PACERONE) 400 MG tablet Take 1 tablet (400 mg total) by mouth daily.  30 tablet  1  . feeding supplement (ENSURE) PUDG Take 1 Container by mouth 3 (three) times daily between meals.      . folic acid (FOLVITE) 1 MG tablet Take 1 tablet (1 mg total) by mouth daily.  30 tablet  1  . guaiFENesin (MUCINEX) 600 MG 12 hr tablet Take 1 tablet (600 mg total) by mouth 2 (two) times daily as needed.      . iron polysaccharides (NIFEREX) 150 MG capsule Take 1 capsule (150 mg total) by mouth daily.   30 capsule  1  . metoprolol tartrate (LOPRESSOR) 25 MG tablet Take 1 tablet (25 mg total) by mouth 2 (two) times daily.  60 tablet  1  . simvastatin (ZOCOR) 5 MG tablet Take 1 tablet (5 mg total) by mouth daily at 6 PM.  30 tablet  1  . spironolactone (ALDACTONE) 25 MG tablet Take 1 tablet (25 mg total) by mouth daily.  30 tablet  1    Allergies  Allergen Reactions  . Fish Allergy Nausea And Vomiting  . Lisinopril     cough  . Penicillins     Hives   . Statins     Leg cramps  . Sulfa Antibiotics     Hives     Patient Active Problem List  Diagnosis  . Benign hypertensive heart disease without heart failure  . Pure hypercholesterolemia  . Severe aortic stenosis  . CAD (coronary artery disease), native coronary artery  . Hypertension  . Hyperlipidemia  . Chronic systolic CHF (congestive heart failure)  . Moderate mitral regurgitation  . Moderate aortic insufficiency  . Cardiogenic shock  . NSTEMI (non-ST elevated myocardial infarction)  . Acute respiratory failure  . Torsades de pointes  . Atrial fibrillation  . Acute on chronic systolic heart failure  . ICD-St.Jude  . Physical deconditioning    History  Smoking status  . Former Smoker  . Quit date:  01/25/1995  Smokeless tobacco  . Not on file    History  Alcohol Use No    Family History  Problem Relation Age of Onset  . Heart disease Brother     Review of Systems: Constitutional: no fever chills diaphoresis or fatigue or change in weight.  Head and neck: no hearing loss, no epistaxis, no photophobia or visual disturbance. Respiratory: No cough, shortness of breath or wheezing. Cardiovascular: No chest pain peripheral edema, palpitations. Gastrointestinal: No abdominal distention, no abdominal pain, no change in bowel habits hematochezia or melena. Genitourinary: No dysuria, no frequency, no urgency, no nocturia. Musculoskeletal:No arthralgias, no back pain, no gait disturbance or  myalgias. Neurological: No dizziness, no headaches, no numbness, no seizures, no syncope, no weakness, no tremors. Hematologic: No lymphadenopathy, no easy bruising. Psychiatric: No confusion, no hallucinations, no sleep disturbance.    Physical Exam: Filed Vitals:   09/21/11 1512  BP: 110/70  Pulse: 80   the general appearance reveals a well-developed well-nourished gentleman in no distress.Pupils equal and reactive.   Extraocular Movements are full.  There is no scleral icterus.  The mouth and pharynx are normal.  The neck is supple.  The carotids reveal no bruits.  The jugular venous pressure is normal.  The thyroid is not enlarged.  There is no lymphadenopathy.  Chest is clear to percussion and auscultation.  The sternotomy incision is healing well.  The heart reveals a soft systolic murmur across the prosthetic aortic valve.  No diastolic murmur.  No rub.  The abdomen is soft and nontender.  The ICD in the left upper chest is healing well it is nontender.  Extremities show no edema.   Assessment / Plan: We are checking a CBC and a basal metabolic panel today.  We gave him a handicap sticker for his car and a prescription for a medic alert system.  We arranged for an appointment back to see her in one month but he will probably transfer his care to his physician in [facility] and we will see him back on a when necessary basis.

## 2011-09-21 NOTE — Progress Notes (Signed)
Wound check-ICD 

## 2011-09-21 NOTE — Patient Instructions (Addendum)
Will obtain labs today and call you with the results (bmet/cbc)  Your physician recommends that you continue on your current medications as directed. Please refer to the Current Medication list given to you today.   Your physician recommends that you schedule a follow-up appointment in: 1 months

## 2011-09-22 LAB — BASIC METABOLIC PANEL
CO2: 23 mEq/L (ref 19–32)
Calcium: 8.6 mg/dL (ref 8.4–10.5)
Creatinine, Ser: 0.9 mg/dL (ref 0.4–1.5)
GFR: 91.09 mL/min (ref >60.00)
Glucose, Bld: 87 mg/dL (ref 70–99)
Sodium: 132 mEq/L — ABNORMAL LOW (ref 135–145)

## 2011-09-22 LAB — CBC WITH DIFFERENTIAL/PLATELET
Basophils Absolute: 0.1 10*3/uL (ref 0.0–0.1)
HCT: 37 % — ABNORMAL LOW (ref 39.0–52.0)
Hemoglobin: 11.7 g/dL — ABNORMAL LOW (ref 13.0–17.0)
Lymphs Abs: 1.6 10*3/uL (ref 0.7–4.0)
MCV: 90.6 fl (ref 78.0–100.0)
Monocytes Absolute: 0.5 10*3/uL (ref 0.1–1.0)
Monocytes Relative: 8 % (ref 3.0–12.0)
Neutro Abs: 4.2 10*3/uL (ref 1.4–7.7)
Platelets: 251 10*3/uL (ref 150.0–400.0)
RDW: 17.3 % — ABNORMAL HIGH (ref 11.5–14.6)

## 2011-09-22 NOTE — Progress Notes (Signed)
Quick Note:  Please report to patient. The recent labs are stable. Continue same medication and careful diet. Hemoglobin has improved. Continue taking vitamins and iron. The potassium is borderline high so avoid too many high potassium foods. ______

## 2011-09-23 ENCOUNTER — Telehealth: Payer: Self-pay

## 2011-09-23 MED ORDER — CIPROFLOXACIN HCL 250 MG PO TABS: ORAL_TABLET | ORAL | Status: DC

## 2011-09-23 NOTE — Telephone Encounter (Signed)
Obtain a urinalysis and culture. Start Cipro 250 mg twice a day for 5 days for urinary tract infection

## 2011-09-23 NOTE — Telephone Encounter (Signed)
Spoke to patient was told Dr.Brackbill has prescribed cipro 250 mg twice a day for 5 days,obtain a U/A and culture.Patient stated Gabriel Marsh his home health nurse already obtained a U/A and culture today.Advised to call back if not better.Also message left on Gabriel Marsh's personal voice mail c[facility] at [address].

## 2011-09-23 NOTE — Telephone Encounter (Signed)
Received phone call from [place] at [facility] stating she is out seeing patient and patient complaining of burning and pain upon urination,low grade fever last night ,at present temp 98.4.States she advised patient to drink more water and cranberry juice.States patient does not have a pcp and wants to know if Dr.Brackbill will order a U/A and Urine Culture.Also wants to know if Dr.Brackbill will prescribe an antibiotic over the weekend.Message sent to Dr.Brackbill.

## 2011-09-28 DIAGNOSIS — I214 Non-ST elevation (NSTEMI) myocardial infarction: Secondary | ICD-10-CM

## 2011-09-28 DIAGNOSIS — R269 Unspecified abnormalities of gait and mobility: Secondary | ICD-10-CM

## 2011-09-28 DIAGNOSIS — I5023 Acute on chronic systolic (congestive) heart failure: Secondary | ICD-10-CM

## 2011-09-29 ENCOUNTER — Telehealth: Payer: Self-pay | Admitting: Cardiology

## 2011-09-29 ENCOUNTER — Ambulatory Visit
Admission: RE | Admit: 2011-09-29 | Discharge: 2011-09-29 | Disposition: A | Discharge status: Home / Self Care | Payer: Medicare Other | Source: Ambulatory Visit | Attending: Cardiothoracic Surgery | Admitting: Cardiothoracic Surgery

## 2011-09-29 ENCOUNTER — Other Ambulatory Visit: Payer: Self-pay | Admitting: Cardiothoracic Surgery

## 2011-09-29 ENCOUNTER — Ambulatory Visit (INDEPENDENT_AMBULATORY_CARE_PROVIDER_SITE_OTHER): Payer: Self-pay | Admitting: Cardiothoracic Surgery

## 2011-09-29 ENCOUNTER — Encounter: Payer: Self-pay | Admitting: Cardiothoracic Surgery

## 2011-09-29 VITALS — BP 110/66 | HR 66 | Resp 18 | Ht 70.0 in | Wt 158.0 lb

## 2011-09-29 DIAGNOSIS — Z951 Presence of aortocoronary bypass graft: Secondary | ICD-10-CM

## 2011-09-29 DIAGNOSIS — I2510 Atherosclerotic heart disease of native coronary artery without angina pectoris: Secondary | ICD-10-CM

## 2011-09-29 DIAGNOSIS — Z953 Presence of xenogenic heart valve: Secondary | ICD-10-CM | POA: Insufficient documentation

## 2011-09-29 DIAGNOSIS — Z954 Presence of other heart-valve replacement: Secondary | ICD-10-CM

## 2011-09-29 DIAGNOSIS — I350 Nonrheumatic aortic (valve) stenosis: Secondary | ICD-10-CM

## 2011-09-29 DIAGNOSIS — Z952 Presence of prosthetic heart valve: Secondary | ICD-10-CM

## 2011-09-29 DIAGNOSIS — I359 Nonrheumatic aortic valve disorder, unspecified: Secondary | ICD-10-CM

## 2011-09-29 NOTE — Patient Instructions (Addendum)
No Driving until cleared with cardiology   Endocarditis Information  You may be at risk for developing endocarditis since you have  an artificial heart valve  or a repaired heart valve. Endocarditis is an infection of the lining of the heart or heart valves.   Certain surgical and dental procedures may put you at risk,  such as teeth cleaning or other dental procedures or any surgery involving the respiratory, urinary, gastrointestinal tract, gallbladder or prostate.   Notify your doctor or dentist before having any invasive procedures. You will need to take antibiotics before certain procedures.   To prevent endocarditis, maintain good oral health. Seek prompt medical attention for any mouth/gum, skin or urinary tract infections.     Aortic Valve Replacement Care After Read the instructions outlined below and refer to this sheet for the next few weeks. These discharge instructions provide you with general information on caring for yourself after you leave the hospital. Your surgeon may also give you specific instructions. While your treatment has been planned according to the most current medical practices available, unavoidable complications occasionally occur. If you have any problems or questions after discharge, please call your surgeon. AFTER THE PROCEDURE  Full recovery from heart valve surgery can take several months.   Blood thinning (anticoagulation) treatment with warfarin is often prescribed for 6 weeks to 3 months after surgery for those with biological valves. It is prescribed for life for those with mechanical valves.   Recovery includes healing of the surgical incision. There is a gradual building of stamina and exercise abilities. An exercise program under the direction of a physical therapist may be recommended.   Once you have an artificial valve, your heart function and your life will return to normal. You usually feel better after surgery. Shortness of breath and fatigue  should lessen. If your heart was already severely damaged before your surgery, you may continue to have problems.   You can usually resume most of your normal activities. You will have to continue to monitor your condition. You need to watch out for blood clots and infections.   Artificial valves need to be replaced after a period of time. It is important that you see your caregiver regularly.   Some individuals with an aortic valve replacement need to take antibiotics before having dental work or other surgical procedures. This is called prophylactic antibiotic treatment. These drugs help to prevent infective endocarditis. Antibiotics are only recommended for individuals with the highest risk for developing infective endocarditis. Let your dentist and your caregiver know if you have a history of any of the following so that the necessary precautions can be taken:   A VSD.   A repaired VSD.   Endocarditis in the past.   An artificial (prosthetic) heart valve.  HOME CARE INSTRUCTIONS    Use all medications as prescribed.   Take your temperature every morning for the first week after surgery. Record these.   Weigh yourself every morning for at least the first week after surgery and record.   Do not lift more than 10 pounds (4.5 kg) until your breastbone (sternum) has healed. Avoid all activities which would place strain on your incision.   You may shower as soon as directed by your caregiver after surgery. Pat incisions dry. Do not rub incisions with washcloth or towel.   Avoid driving for 4 to 6 weeks following surgery or as instructed.   Use your elastic stockings during the day. You should wear the stockings for  at least 2 weeks after discharge or longer if your ankles are swollen. The stockings help blood flow and help reduce swelling in the legs. It is easiest to put the stockings on before you get out of bed in the morning. They should fit snugly.  Pain Control  If a prescription  was given for a pain reliever, please follow your doctor's directions.   If the pain is not relieved by your medicine, becomes worse, or you have difficulty breathing, call your surgeon.  Activity  Take frequent rest periods throughout the day.   Wait one week before returning to strenuous activities such as heavy lifting (more than 10 pounds), pushing or pulling.   Talk with your doctor about when you may return to work and your exercise routine.   Do not drive while taking prescription pain medication.  Nutrition  You may resume your normal diet.   Drink plenty of fluids (6-8 glasses a day).   Eat a well-balanced diet.   Call your caregiver for persistent nausea or vomiting.  Elimination Your normal bowel function should return. If constipation should occur, you may:  Take a mild laxative.   Add fruit and bran to your diet.   Drink more fluids.   Call your doctor if constipation is not relieved.  SEEK IMMEDIATE MEDICAL CARE IF:    You develop chest pain which is not coming from your surgical cut (incision).   You develop shortness of breath or have difficulty breathing.   You develop a temperature over 101 F (38.3 C).   You have a sudden weight gain. Let your caregiver know what the weight gain is.   You develop a rash.   You develop any reaction or side effects to medications given.   You have increased bleeding from wounds.   You see redness, swelling, or have increasing pain in wounds.   You have pus coming from your wound.   You develop lightheadedness or feel faint.  Document Released: 07/29/2004 Document Revised: 12/30/2010 Document Reviewed: 10/20/2004 [facility] Patient Information 2012 [place], [place].

## 2011-09-29 NOTE — Telephone Encounter (Signed)
Message copied by Burnell Blanks on Thu Sep 29, 2011  5:14 PM ------      Message from: Cassell Clement      Created: Thu Sep 22, 2011 12:44 PM       Please report to patient.  The recent labs are stable. Continue same medication and careful diet.  Hemoglobin has improved.  Continue taking vitamins and iron.  The potassium is borderline high so avoid too many high potassium foods.

## 2011-09-29 NOTE — Telephone Encounter (Signed)
Advised patient of lab results  

## 2011-09-29 NOTE — Progress Notes (Signed)
[address]            [place] [address]          [phone]       Jhan Conery [facility] Medical Record #[id number] Date of Birth: 07-20-1930  Dr Damaris Schooner, MD  Chief Complaint:   PostOp Follow Up Visit 08/25/2011  DATE OF DISCHARGE:  OPERATIVE REPORT  PREOPERATIVE DIAGNOSES: Recent non-ST elevation myocardial infarction  with three-vessel coronary artery disease and critical aortic stenosis.  Preoperative intra-aortic balloon pump in place.  POSTOPERATIVE DIAGNOSES: Recent non-ST elevation myocardial infarction  with three-vessel coronary artery disease and critical aortic stenosis.  Preoperative intra-aortic balloon pump in place.  SURGICAL PROCEDURE: Coronary artery bypass grafting x3 with left  internal mammary to the left anterior descending coronary artery,  reverse saphenous vein graft to intermediate coronary artery, and  reverse saphenous vein graft to the distal right coronary artery with  right leg endo vein harvesting, and aortic valve replacement or the  pericardial tissue valve [employer] model #3300 TFX 23 mm,  serial #[id number].   History of Present Illness:      Now home from rehab, progressing well. Now just uses cane. No sob, no pedal edema.no PND     History  Smoking status  . Former Smoker  . Quit date: 01/25/1995  Smokeless tobacco  . Not on file       Allergies  Allergen Reactions  . Fish Allergy Nausea And Vomiting  . Lisinopril     cough  . Penicillins     Hives   . Statins     Leg cramps  . Sulfa Antibiotics     Hives     Current Outpatient Prescriptions  Medication Sig Dispense Refill  . amiodarone (PACERONE) 400 MG tablet Take 1 tablet (400 mg total) by mouth daily.  30 tablet  1  . feeding supplement (ENSURE) PUDG Take 1 Container by mouth 3 (three) times daily between meals.      . folic acid (FOLVITE) 1 MG tablet Take 1 tablet (1 mg total) by mouth daily.   30 tablet  1  . guaiFENesin (MUCINEX) 600 MG 12 hr tablet Take 1 tablet (600 mg total) by mouth 2 (two) times daily as needed.      . iron polysaccharides (NIFEREX) 150 MG capsule Take 1 capsule (150 mg total) by mouth daily.  30 capsule  1  . metoprolol tartrate (LOPRESSOR) 25 MG tablet Take 1 tablet (25 mg total) by mouth 2 (two) times daily.  60 tablet  1  . simvastatin (ZOCOR) 5 MG tablet Take 1 tablet (5 mg total) by mouth daily at 6 PM.  30 tablet  1  . spironolactone (ALDACTONE) 25 MG tablet Take 1 tablet (25 mg total) by mouth daily.  30 tablet  1       Physical Exam: BP 110/66  Pulse 66  Resp 18  Ht 5\' 10"  (1.778 m)  Wt 158 lb (71.668 kg)  BMI 22.67 kg/m2  SpO2 98%  General appearance: alert, cooperative and no distress Neurologic: intact Heart: regular rate and rhythm, S1, S2 normal, no murmur, click, rub or gallop, prominent apical impulse and no rub Lungs: clear to auscultation bilaterally and normal percussion bilaterally Abdomen: soft, non-tender; bowel sounds normal; no masses,  no organomegaly Extremities: extremities normal, atraumatic, no cyanosis or edema, Homans sign is  negative, no sign of DVT and no edema, redness or tenderness in the calves or thighs Wound: wound well healed, sternum stable. left ICD incision also healed   Diagnostic Studies & Laboratory data:         Recent Radiology Findings: Dg Chest 2 View  09/29/2011  *RADIOLOGY REPORT*  Clinical Data: Followup CABG  CHEST - 2 VIEW  Comparison: Chest radiograph 09/13/2011  Findings: Left-sided pacemaker and sternotomy wires overlie stable cardiac silhouette.  There is some improvement and aeration to the left lung base compared to prior.  No evidence of pulmonary edema or pneumothorax.  Lungs are mildly hyperinflated.  IMPRESSION:  1.  Improvement in left lower lobe atelectasis. 2.  No pulmonary edema or pneumothorax.   Original Report Authenticated By: Genevive Bi, M.D.       Recent Labs: Lab  Results  Component Value Date   WBC 6.6 09/21/2011   HGB 11.7* 09/21/2011   HCT 37.0* 09/21/2011   PLT 251.0 09/21/2011   GLUCOSE 87 09/21/2011   CHOL 116 08/25/2011   TRIG 48 08/25/2011   HDL 48 08/25/2011   LDLCALC 58 08/25/2011   ALT 30 09/09/2011   AST 26 09/09/2011   NA 132* 09/21/2011   K 5.1 09/21/2011   CL 103 09/21/2011   CREATININE 0.9 09/21/2011   BUN 23 09/21/2011   CO2 23 09/21/2011   INR 1.89* 08/25/2011      Assessment / Plan:     Good progress after urgent AVR,CABG and AICD and long recovery in Rehab. No overt chf but with known poor lv function Will see prn at his or Dr Yevonne Pax request      Delight Ovens MD 09/29/2011 9:52 AM

## 2011-09-29 NOTE — Telephone Encounter (Signed)
New Problem: ° ° ° °Patient returned your call.  Please call back. °

## 2011-10-03 ENCOUNTER — Telehealth: Payer: Self-pay | Admitting: Internal Medicine

## 2011-10-03 ENCOUNTER — Encounter: Payer: Self-pay | Admitting: Cardiology

## 2011-10-03 NOTE — Telephone Encounter (Signed)
Spoke w/wife in regards to [employer] transmissions. Pt to try and resend transmission today and return call on 10-04-11 to see if received.

## 2011-10-03 NOTE — Telephone Encounter (Signed)
New Problem:    Called in wanting to know if we received a reading form the patient's wireless transmission device. Please call back.

## 2011-10-05 ENCOUNTER — Telehealth: Payer: Self-pay | Admitting: Internal Medicine

## 2011-10-05 NOTE — Telephone Encounter (Signed)
Spoke with niece due to patient's hearing impairment.  Transmission was received.  The patient is planning on transferring his care to a practice in [place].  The niece will contact us when care is set up.

## 2011-10-05 NOTE — Telephone Encounter (Signed)
Pt remote was to be done yesterday, not sure if you got it, can you check? Wants to know more about how the merlin works for [employer], pls call

## 2011-10-19 ENCOUNTER — Ambulatory Visit: Payer: Medicare Other | Admitting: Cardiology

## 2011-12-29 ENCOUNTER — Encounter: Payer: Self-pay | Admitting: Internal Medicine

## 2011-12-29 ENCOUNTER — Telehealth: Payer: Self-pay | Admitting: Internal Medicine

## 2011-12-29 ENCOUNTER — Ambulatory Visit (INDEPENDENT_AMBULATORY_CARE_PROVIDER_SITE_OTHER): Payer: Medicare Other | Admitting: Internal Medicine

## 2011-12-29 VITALS — BP 150/84 | HR 69 | Ht 70.0 in | Wt 174.0 lb

## 2011-12-29 DIAGNOSIS — Z95810 Presence of automatic (implantable) cardiac defibrillator: Secondary | ICD-10-CM

## 2011-12-29 DIAGNOSIS — I5023 Acute on chronic systolic (congestive) heart failure: Secondary | ICD-10-CM

## 2011-12-29 DIAGNOSIS — I472 Ventricular tachycardia, unspecified: Secondary | ICD-10-CM

## 2011-12-29 DIAGNOSIS — I509 Heart failure, unspecified: Secondary | ICD-10-CM

## 2011-12-29 DIAGNOSIS — I5022 Chronic systolic (congestive) heart failure: Secondary | ICD-10-CM

## 2011-12-29 LAB — ICD DEVICE OBSERVATION
AL AMPLITUDE: 5 mv
AL IMPEDENCE ICD: 512.5 Ohm
DEVICE MODEL ICD: 7046565
FVT: 0
HV IMPEDENCE: 65 Ohm
RV LEAD AMPLITUDE: 11.8 mv
TOT-0007: 2
TOT-0008: 0
TOT-0010: 2
TZAT-0001SLOWVT: 1
TZAT-0004SLOWVT: 8
TZAT-0018SLOWVT: NEGATIVE
TZAT-0019SLOWVT: 7.5 V
TZAT-0020SLOWVT: 1 ms
TZON-0003SLOWVT: 325 ms
TZON-0004SLOWVT: 30
TZON-0005SLOWVT: 6
TZON-0010SLOWVT: 40 ms
TZST-0001SLOWVT: 2
TZST-0001SLOWVT: 3
TZST-0001SLOWVT: 4
VF: 0

## 2011-12-29 MED ORDER — AMIODARONE HCL 200 MG PO TABS: 200.0000 mg | ORAL_TABLET | Freq: Every day | ORAL | Status: AC

## 2011-12-29 NOTE — Assessment & Plan Note (Signed)
His chronic systolic heart failure is class II. He will continue his current medical therapy.

## 2011-12-29 NOTE — Assessment & Plan Note (Signed)
He has had no recurrent ventricular tachycardia. I've recommended that the patient reduce his dose of amiodarone to 200 mg daily.

## 2011-12-29 NOTE — Telephone Encounter (Signed)
Pt was told at today's ov to decrease amiodarone 200mg  but his pt info sheet still says 400mg  and he needs confirmation

## 2011-12-29 NOTE — Telephone Encounter (Signed)
Take 1 daily  Patient aware

## 2011-12-29 NOTE — Progress Notes (Signed)
HPI Gabriel Marsh returns today for followup. He is a very pleasant 78 year old man with ventricular tachycardia, status post ICD implantation. He has an ischemic cardiomyopathy and chronic systolic heart failure. In the interim, he has done well. He denies chest pain or peripheral edema. No syncope. He has class II heart failure symptoms. Allergies  Allergen Reactions  . Fish Allergy Nausea And Vomiting  . Lisinopril     cough  . Penicillins     Hives   . Statins     Leg cramps  . Sulfa Antibiotics     Hives      Current Outpatient Prescriptions  Medication Sig Dispense Refill  . aspirin 325 MG EC tablet Take 325 mg by mouth daily.      . Ferrous Sulfate (FEROSUL PO) Take 325 capsules by mouth daily.      . folic acid (FOLVITE) 1 MG tablet Take 1 tablet (1 mg total) by mouth daily.  30 tablet  1  . metoprolol tartrate (LOPRESSOR) 25 MG tablet Take 1 tablet (25 mg total) by mouth 2 (two) times daily.  60 tablet  1  . simvastatin (ZOCOR) 5 MG tablet Take 1 tablet (5 mg total) by mouth daily at 6 PM.  30 tablet  1  . spironolactone (ALDACTONE) 25 MG tablet Take 1 tablet (25 mg total) by mouth daily.  30 tablet  1  . [DISCONTINUED] amiodarone (PACERONE) 400 MG tablet Take 1 tablet (400 mg total) by mouth daily.  30 tablet  1  . amiodarone (PACERONE) 200 MG tablet Take 1 tablet (200 mg total) by mouth daily.  30 tablet  11     Past Medical History  Diagnosis Date  . Hypertension   . Hyperlipidemia   . Mitral regurgitation     Moderate by 08/20/11 echo  . Shortness of breath   . CAD (coronary artery disease)     a) 08/22/11 cath :  50-60% left main stenosis, mild-moderate LAD disease, 99% ostial large diagonal stenosis, occluded and collateralized LCx and RCA  . Aortic stenosis     Severe by 08/20/11 echo  . Aortic insufficiency     Moderate by 08/20/11 echo  . Chronic systolic CHF (congestive heart failure)     a) 08/20/11 echo : LVEF 35-40%    ROS:   All systems reviewed  and negative except as noted in the HPI.   Past Surgical History  Procedure Date  . Eye surgery   . Cardiac catheterization 08/22/11     50-60% left main stenosis, mild-moderate LAD disease, 99% ostial large diagonal stenosis, occluded and collateralized LCx and RCA  . Coronary artery bypass graft 08/25/2011    Procedure: CORONARY ARTERY BYPASS GRAFTING (CABG);  Surgeon: Delight Ovens, MD;  Location: [facility] OR;  Service: Open Heart Surgery;  Laterality: N/A;  Hot and humid all through surgery.  . Aortic valve replacement 08/25/2011    Procedure: AORTIC VALVE REPLACEMENT (AVR);  Surgeon: Delight Ovens, MD;  Location: [facility] OR;  Service: Open Heart Surgery;  Laterality: N/A;  hot and humid all through surgery.     Family History  Problem Relation Age of Onset  . Heart disease Brother      History   Social History  . Marital Status: Single    Spouse Name: N/A    Number of Children: N/A  . Years of Education: N/A   Occupational History  . Not on file.   Social History Main Topics  . Smoking status:  Former Smoker    Quit date: 01/25/1995  . Smokeless tobacco: Not on file  . Alcohol Use: No  . Drug Use: No  . Sexually Active: Not on file   Other Topics Concern  . Not on file   Social History Narrative  . No narrative on file     BP 150/84  Pulse 69  Ht 5\' 10"  (1.778 m)  Wt 174 lb (78.926 kg)  BMI 24.97 kg/m2  Physical Exam:  Well appearing elderly man, NAD HEENT: Unremarkable Neck:  7 cm JVD, no thyromegally Lungs:  Clear with no wheezes, rales, or rhonchi. HEART:  Regular rate rhythm, no murmurs, no rubs, no clicks Abd:  soft, positive bowel sounds, no organomegally, no rebound, no guarding Ext:  2 plus pulses, no edema, no cyanosis, no clubbing Skin:  No rashes no nodules Neuro:  CN II through XII intact, motor grossly intact  EKG Normal sinus rhythm with incomplete left bundle branch block. DEVICE  Normal device function.  See PaceArt for details.    Assess/Plan:

## 2011-12-29 NOTE — Patient Instructions (Addendum)
Your physician wants you to follow-up in: 9 months with Dr in [place]   You need to contact the new MD and have them request all your information from [place]

## 2011-12-29 NOTE — Assessment & Plan Note (Signed)
His [employer] dual-chamber ICD is working normally. He will continue his current medical therapy.

## 2013-05-18 IMAGING — CR DG CHEST 1V PORT
1 series · 1 of 1 positions shown · non-contrast
Comparison: Chest x-ray 09/02/2011.

CLINICAL DATA: Evaluate chest tube.

PORTABLE CHEST - 1 VIEW

[AP]
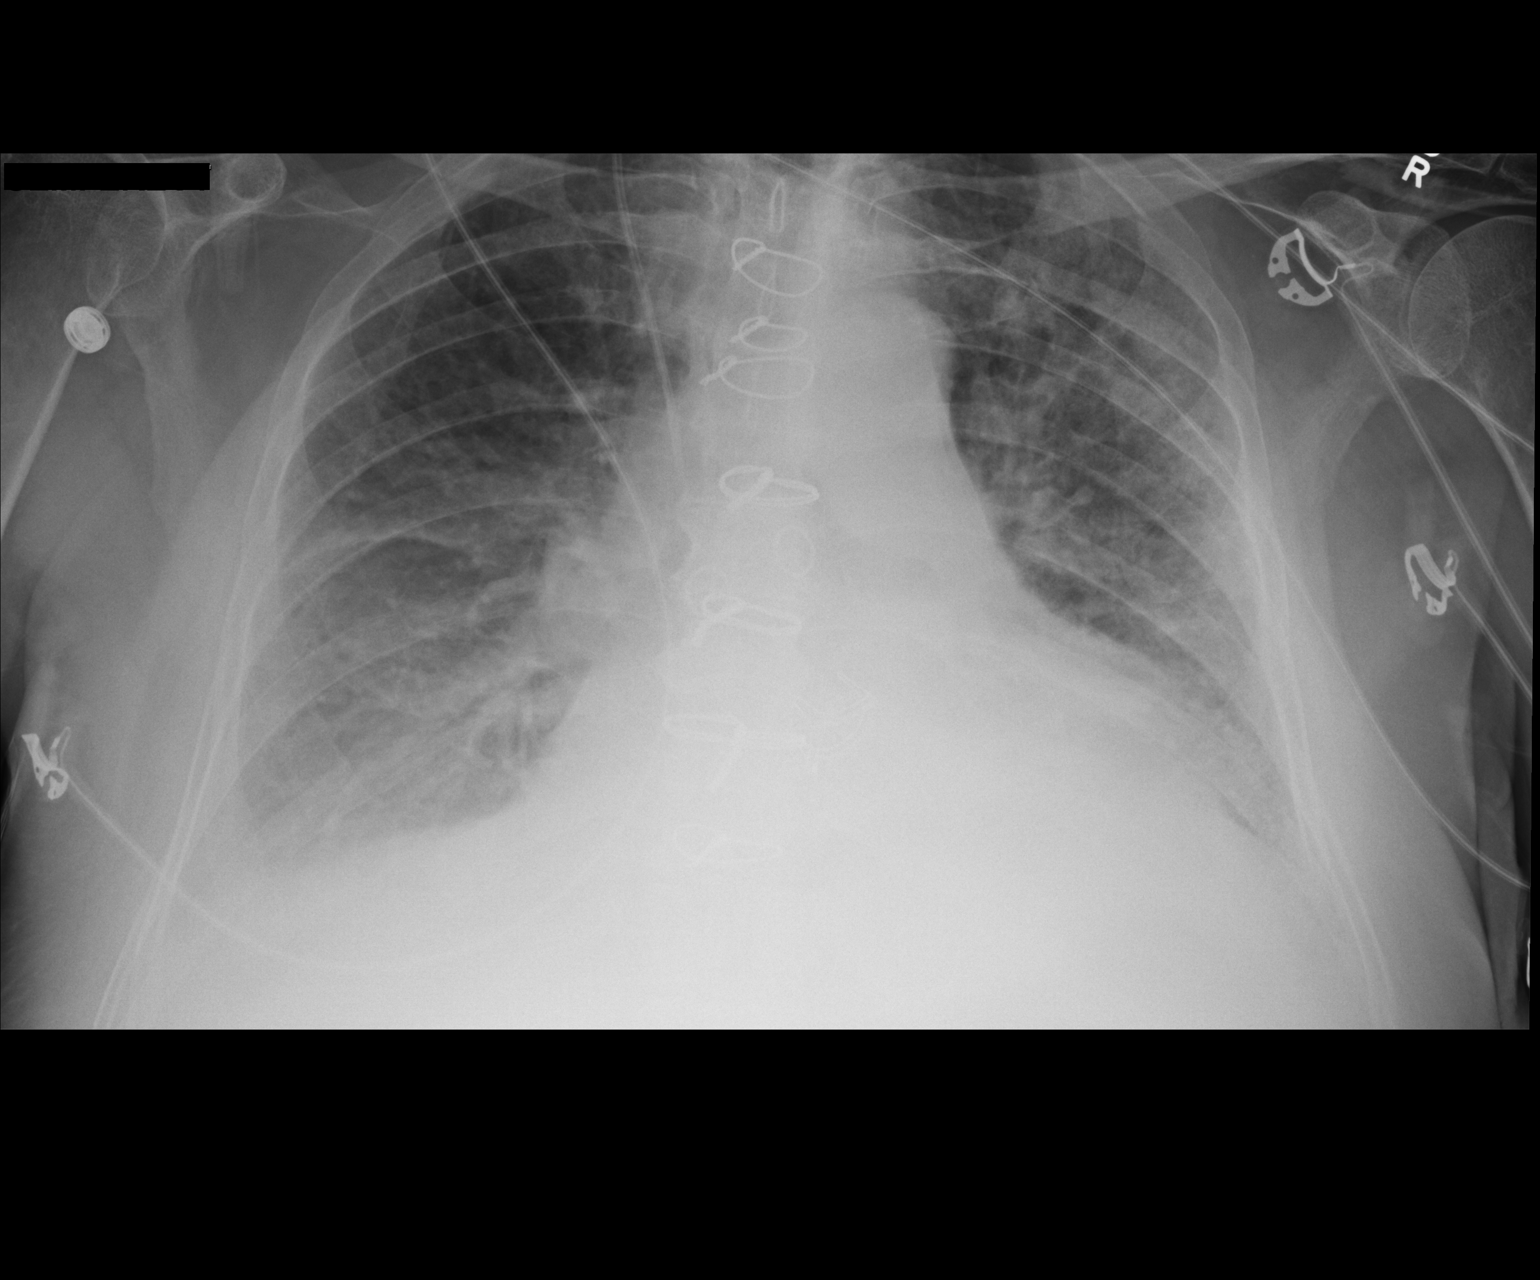

[1 of 1 positions shown; findings below may reference images not displayed]

FINDINGS: There is a right-sided internal jugular central venous
catheter with tip terminating in the distal superior vena cava.
There is cephalization of the pulmonary vasculature, indistinctness
of the interstitial markings, and patchy airspace disease
throughout the lungs bilaterally suggestive of moderate pulmonary
edema.  Small bilateral pleural effusions (right greater than
left).  Bibasilar opacities compatible with atelectasis.  Mild
enlargement of the cardiopericardial silhouette is unchanged. The
patient is rotated to the left on today's exam, resulting in
distortion of the mediastinal contours and reduced diagnostic
sensitivity and specificity for mediastinal pathology. Status post
median sternotomy for CABG and aortic valve replacement (a stented
bioprosthesis is noted).
IMPRESSION: 1.  Appearance of the chest suggests congestive heart failure.
However, the patchy interstitial and airspace disease is slightly
asymmetric involving the left upper lobe to a greater extent than
the right.  Clinical correlation for signs and symptoms of
superimposed infection is recommended.
2.  Small bilateral pleural effusions with bibasilar dependent
atelectasis is also noted.
3.  Mild enlargement of the cardiopericardial silhouette is
unchanged.

## 2013-05-19 IMAGING — CR DG CHEST 1V PORT
1 series · 1 of 1 positions shown · non-contrast
Comparison: 09/03/2011; 09/02/2011; 09/01/2011

CLINICAL DATA: Pulmonary edema

PORTABLE CHEST - 1 VIEW

[AP]
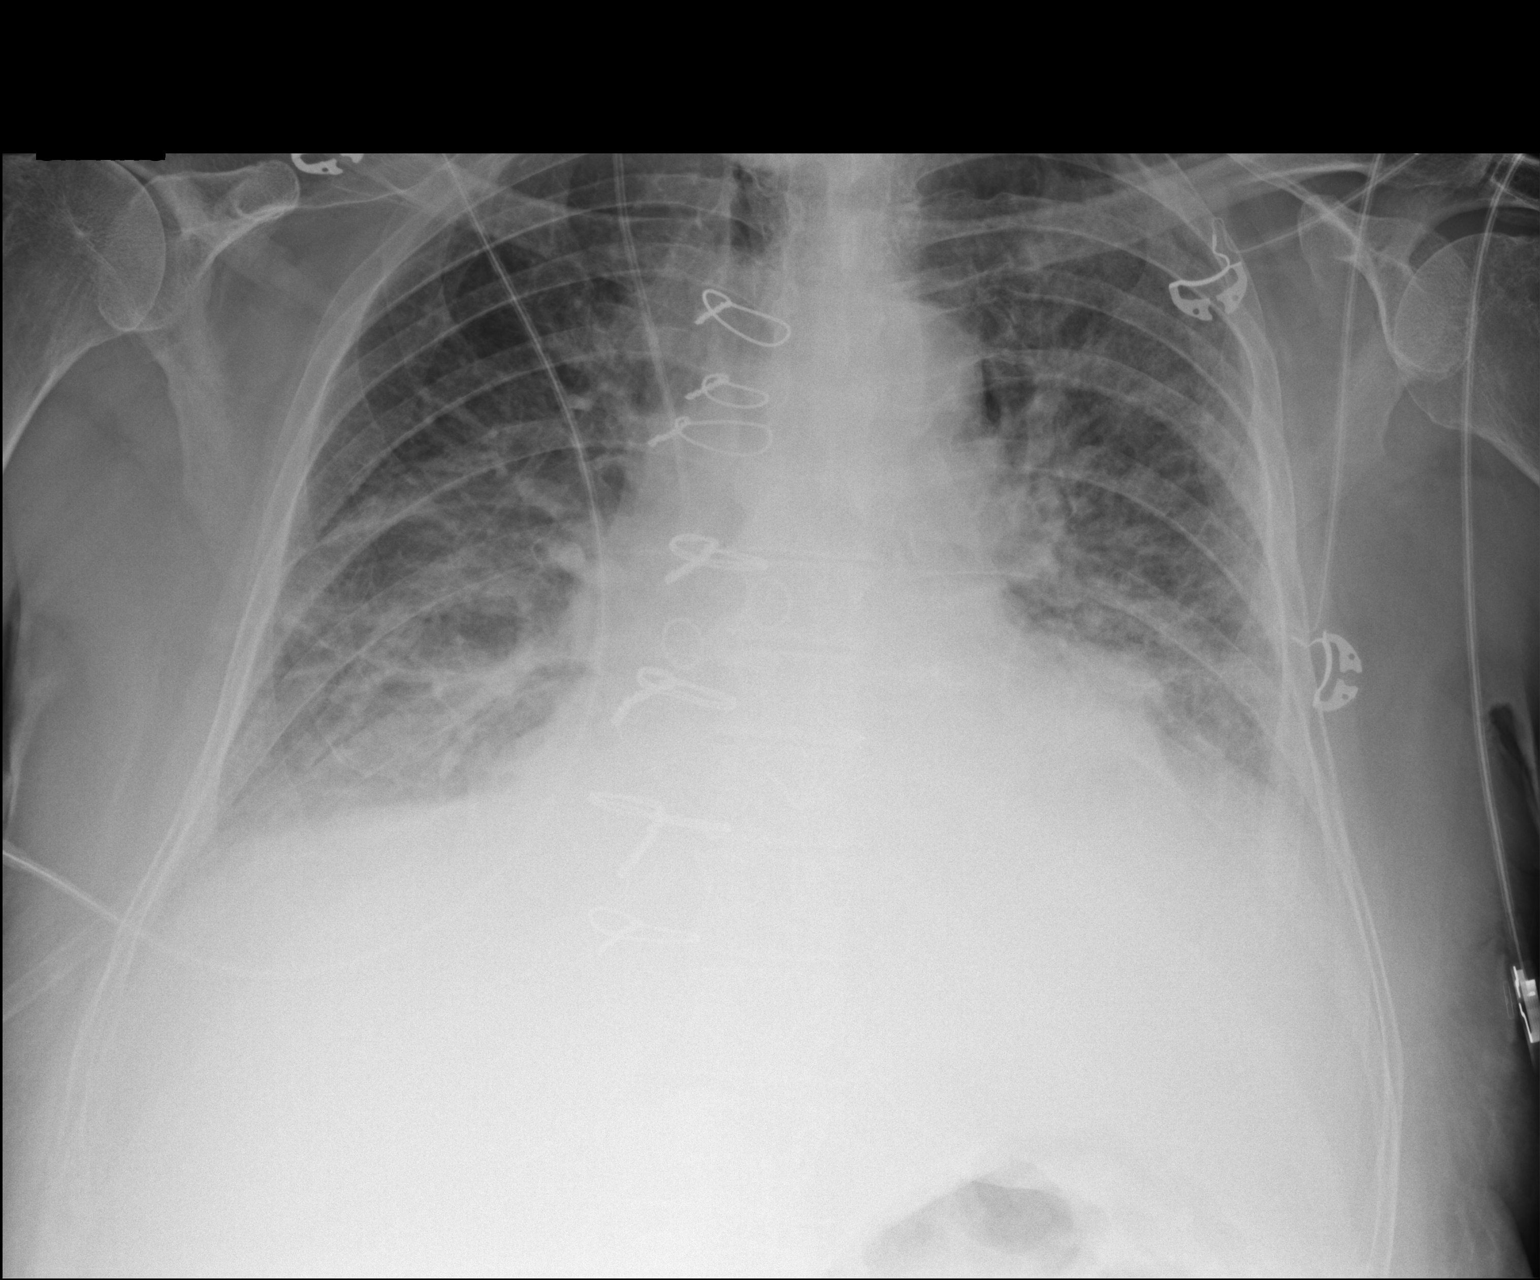

[1 of 1 positions shown; findings below may reference images not displayed]

FINDINGS: Grossly unchanged large cardiac silhouette and
mediastinal contours post median sternotomy and CABG.  Stable
position of support apparatus.  The pulmonary vasculature remains
indistinct with cephalization of flow.  Grossly unchanged small
bilateral effusions and bibasilar heterogeneous opacity.  No
pneumothorax.  Unchanged bones.
IMPRESSION: Grossly unchanged findings of pulmonary edema with small bilateral
effusions and bibasilar opacities, left greater than right,
atelectasis versus infiltrate.

## 2013-05-20 IMAGING — CR DG CHEST 1V PORT
1 series · 1 of 1 positions shown · non-contrast
Comparison: Yesterday

CLINICAL DATA: PICC placement

PORTABLE CHEST - 1 VIEW

[AP]
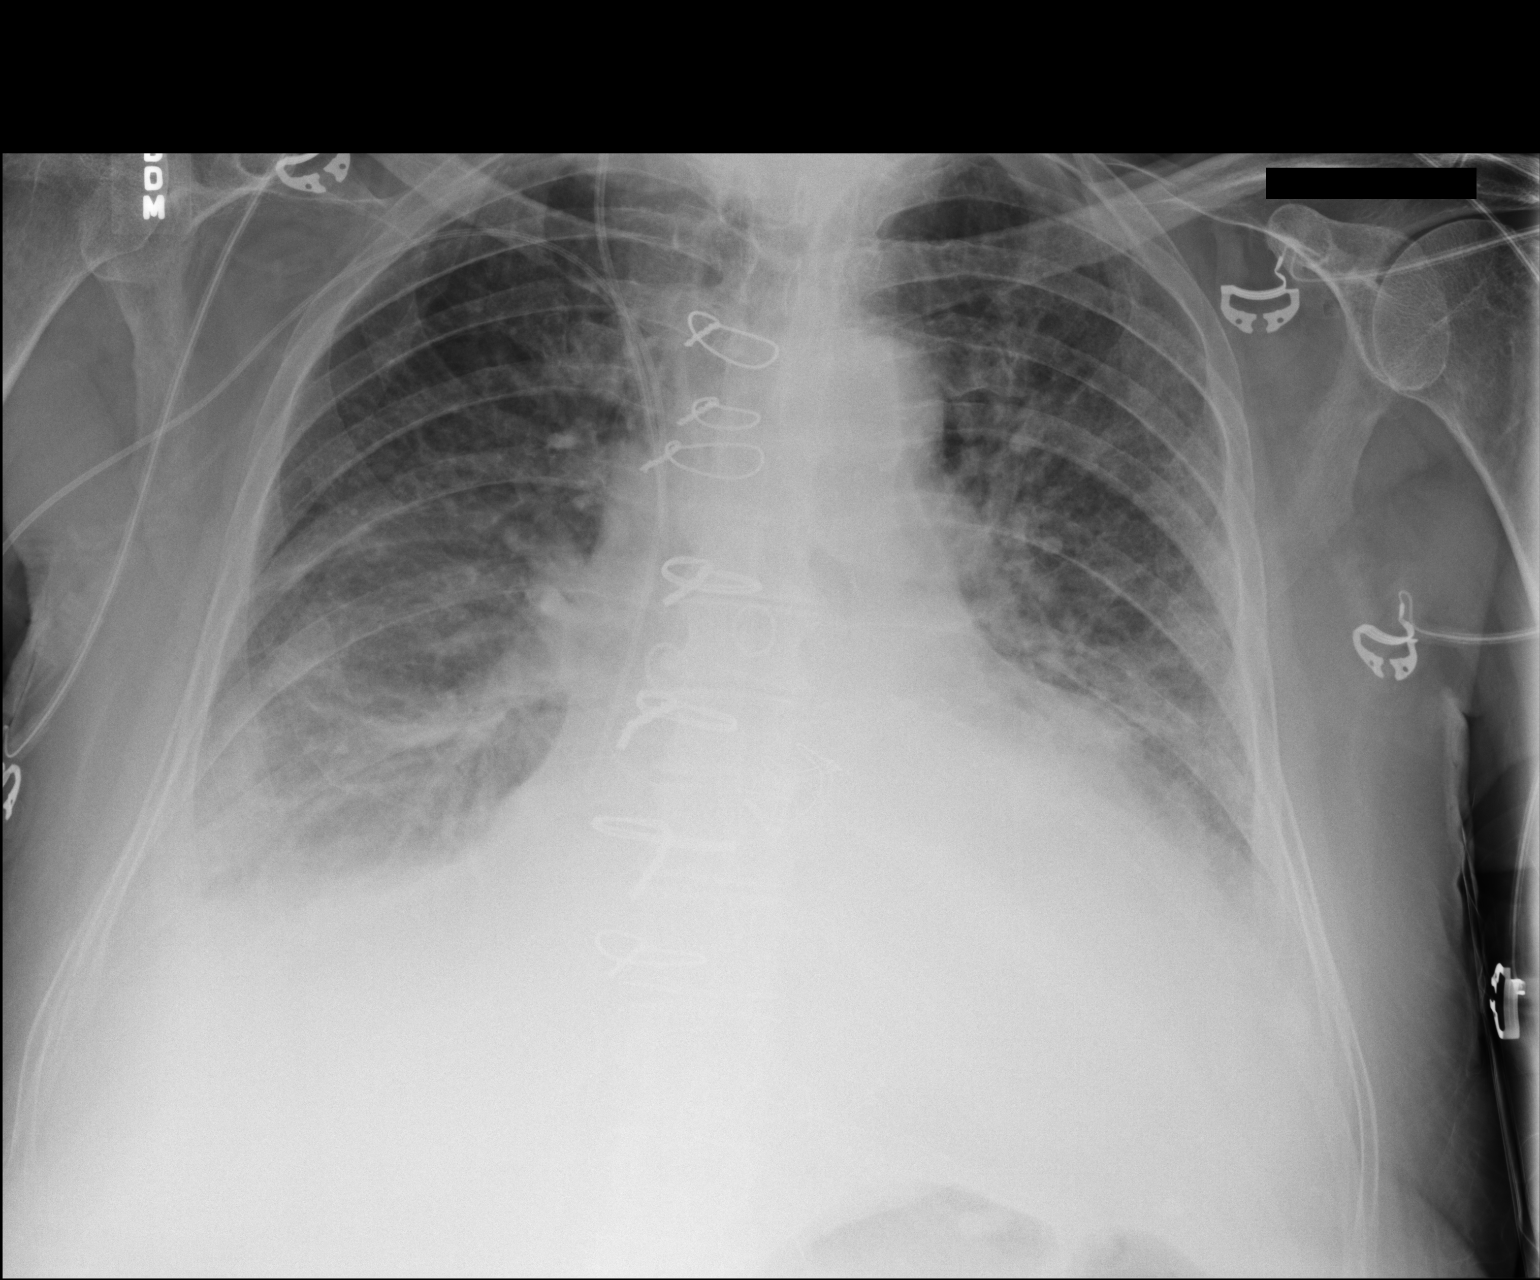

[1 of 1 positions shown; findings below may reference images not displayed]

FINDINGS: Stable cardiomegaly.  Diffuse edema slightly improved.
Bilateral pleural effusions not significantly changed.  Right
internal jugular vein central venous catheter stable.  Right PICC
placed with its tip in the right atrium.  This should be retracted
2 cm.
IMPRESSION: PICC tip in the right atrium.  This should be retracted 2 cm.

Edema slightly improved.

## 2013-05-23 IMAGING — CR DG CHEST 2V
2 series · 2 of 2 positions shown · non-contrast
Comparison: 09/07/2011.

CLINICAL DATA: Short of breath.  Aortic valve replacement and
pacemaker placement.

CHEST - 2 VIEW

[w chest pa]
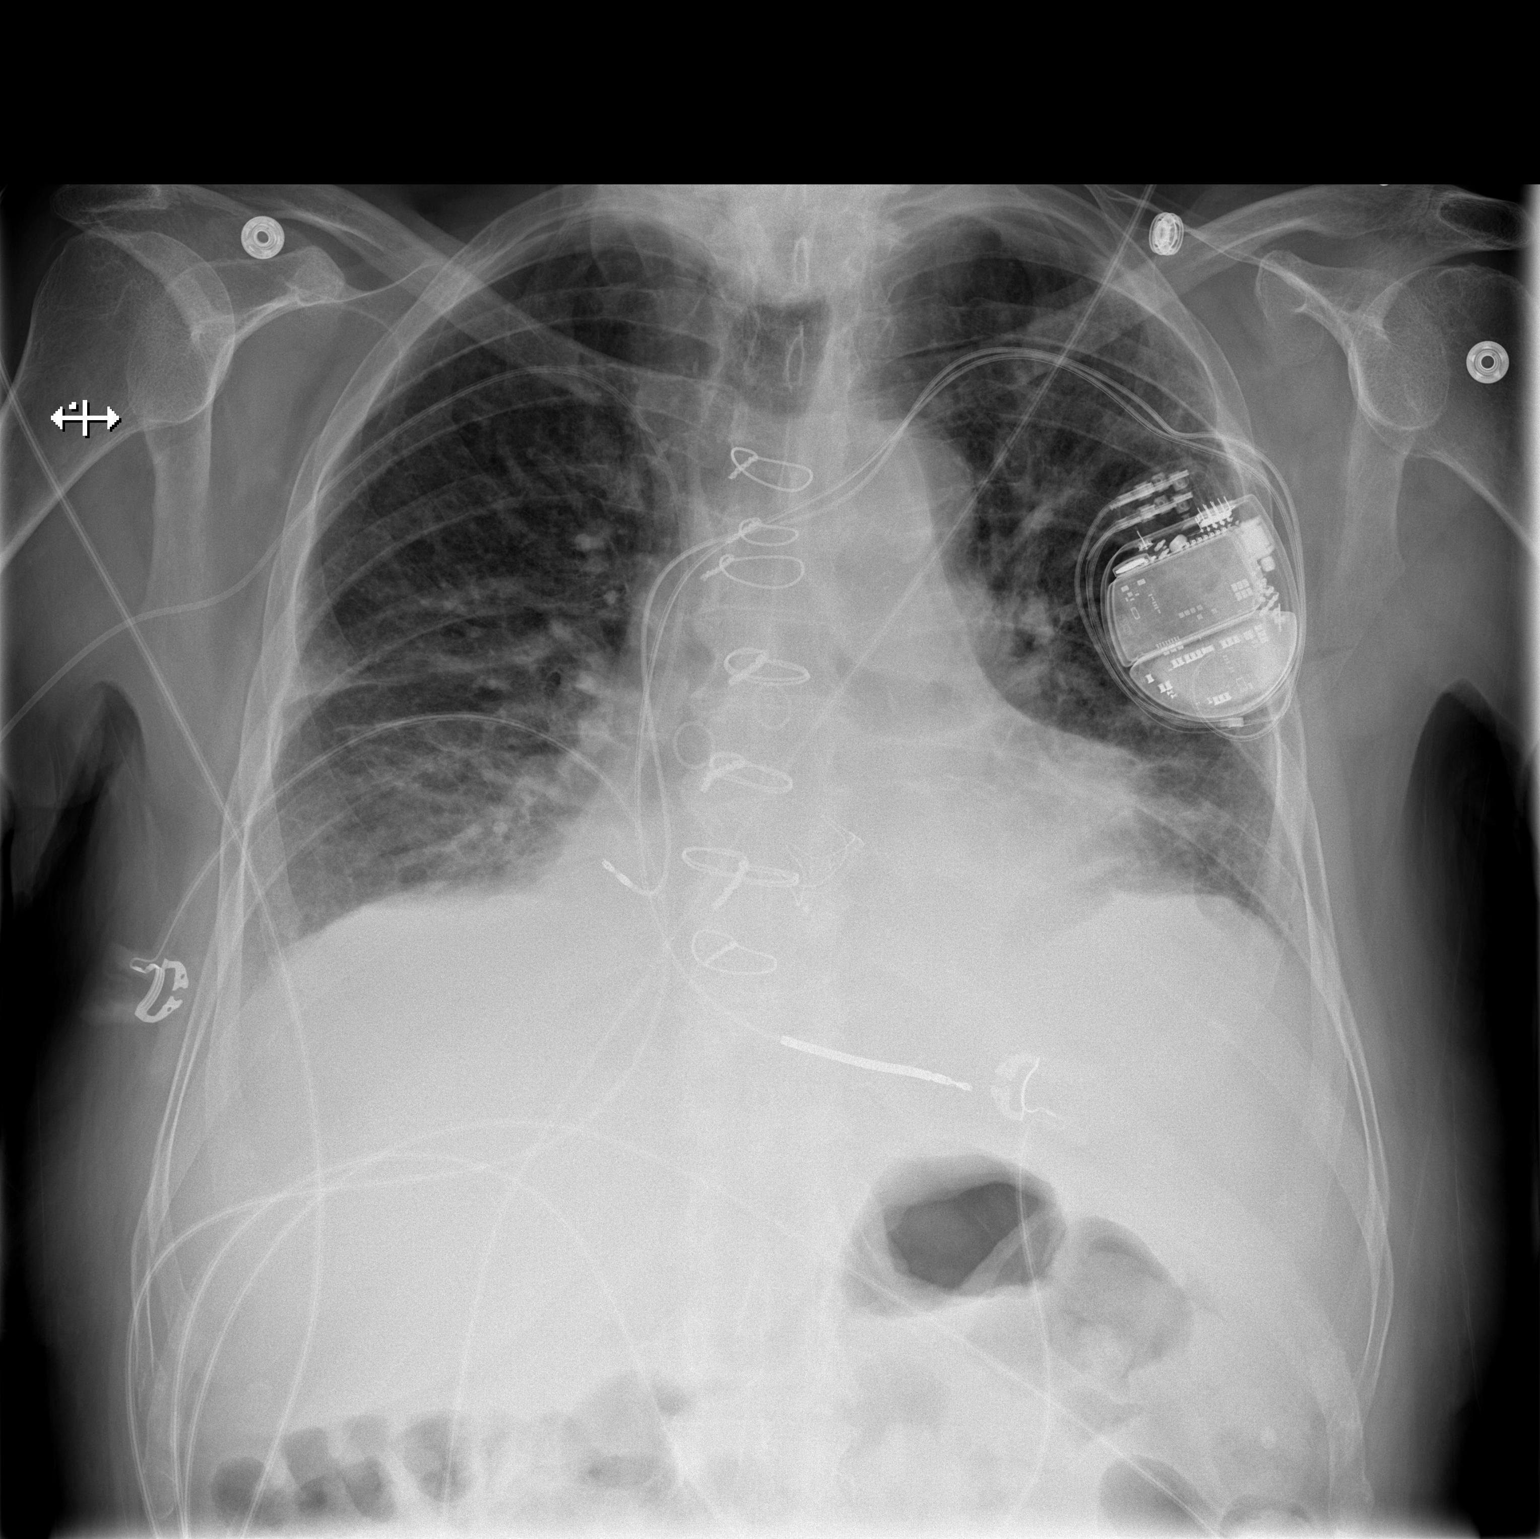

[w chest lat]
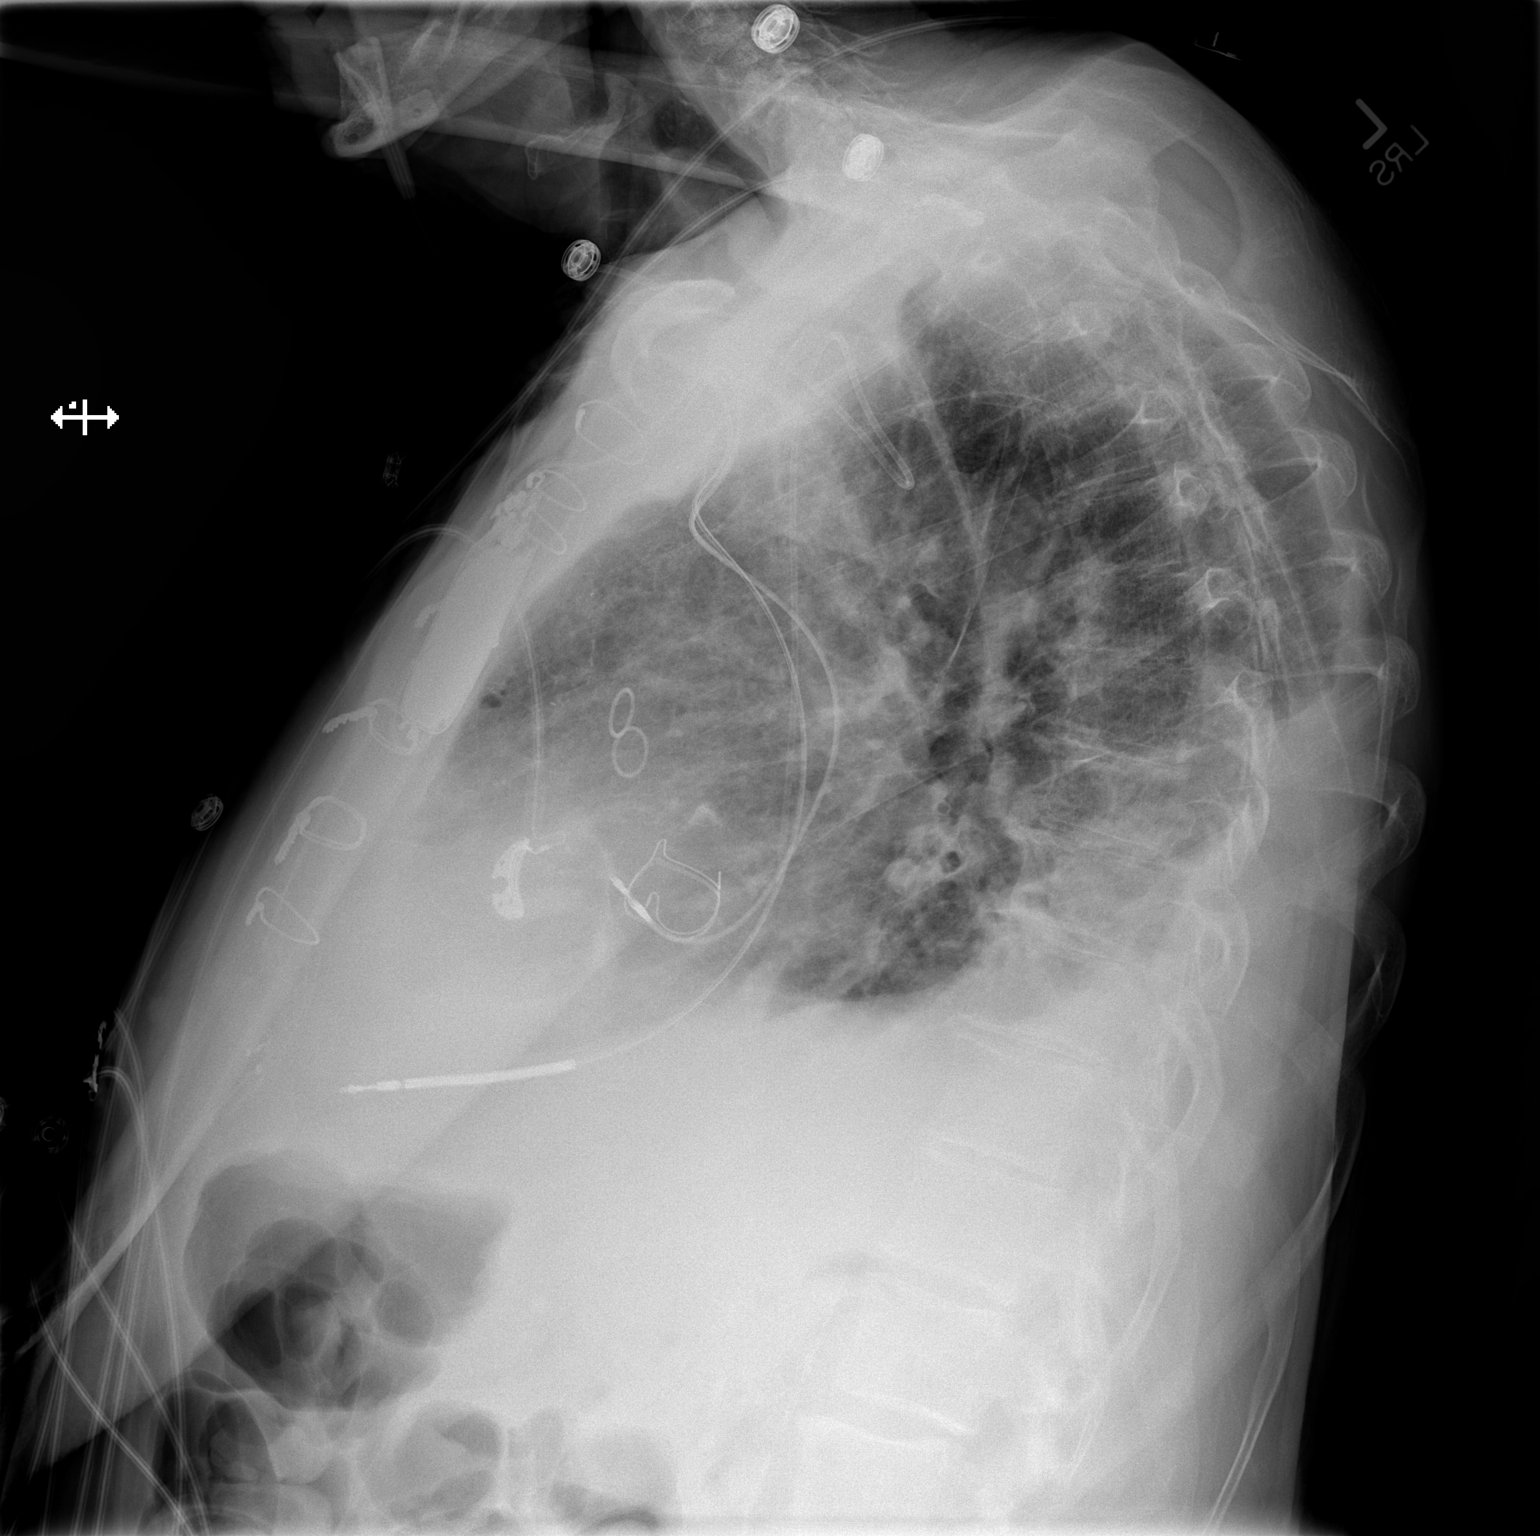

[2 of 2 positions shown; findings below may reference images not displayed]

FINDINGS: Mild CHF persists.  CABG/median sternotomy with aortic
valve replacement.  Dual lead left subclavian cardiac pacemaker
appears similar to prior exam.  Bilateral basilar atelectasis,
interstitial and mild alveolar pulmonary edema.  Dependently
layering bilateral small pleural effusions with associated
compressive atelectasis.  The compared yesterday's exam, the
pleural effusions appear slightly smaller although overall aeration
is little changed. Right upper extremity PICC appears similar.
IMPRESSION: Little interval change. Minimal decrease in pleural effusions and
compressive atelectasis.

## 2014-01-02 ENCOUNTER — Encounter (HOSPITAL_COMMUNITY): Payer: Self-pay | Admitting: Cardiology

## 2014-11-24 LAB — PULMONARY FUNCTION TEST

## 2015-06-30 ENCOUNTER — Other Ambulatory Visit (HOSPITAL_COMMUNITY): Payer: Self-pay | Admitting: Thoracic Diseases

## 2015-06-30 DIAGNOSIS — R042 Hemoptysis: Secondary | ICD-10-CM

## 2015-06-30 DIAGNOSIS — C3411 Malignant neoplasm of upper lobe, right bronchus or lung: Secondary | ICD-10-CM

## 2015-06-30 DIAGNOSIS — R918 Other nonspecific abnormal finding of lung field: Secondary | ICD-10-CM

## 2015-07-03 ENCOUNTER — Ambulatory Visit (HOSPITAL_COMMUNITY)
Admission: RE | Admit: 2015-07-03 | Discharge: 2015-07-03 | Disposition: A | Discharge status: Home / Self Care | Payer: Non-veteran care | Source: Ambulatory Visit | Attending: Thoracic Diseases | Admitting: Thoracic Diseases

## 2015-07-03 DIAGNOSIS — R042 Hemoptysis: Secondary | ICD-10-CM

## 2015-07-03 DIAGNOSIS — C3411 Malignant neoplasm of upper lobe, right bronchus or lung: Secondary | ICD-10-CM | POA: Diagnosis not present

## 2015-07-03 DIAGNOSIS — R918 Other nonspecific abnormal finding of lung field: Secondary | ICD-10-CM

## 2015-07-03 LAB — GLUCOSE, CAPILLARY: GLUCOSE-CAPILLARY: 95 mg/dL (ref 65–99)

## 2015-07-03 MED ORDER — FLUDEOXYGLUCOSE F - 18 (FDG) INJECTION
8.4000 | Freq: Once | INTRAVENOUS | Status: AC | PRN
  Administered 2015-07-03: 8.4 via INTRAVENOUS

## 2015-07-09 ENCOUNTER — Inpatient Hospital Stay (HOSPITAL_COMMUNITY)
Admission: AD | Admit: 2015-07-09 | Discharge: 2015-07-13 | DRG: 181 | Disposition: A | Discharge status: Home Health | Payer: Medicare Other | Source: Other Acute Inpatient Hospital | Attending: Family Medicine | Admitting: Family Medicine

## 2015-07-09 ENCOUNTER — Inpatient Hospital Stay (HOSPITAL_COMMUNITY): Payer: Medicare Other

## 2015-07-09 DIAGNOSIS — I2510 Atherosclerotic heart disease of native coronary artery without angina pectoris: Secondary | ICD-10-CM | POA: Diagnosis present

## 2015-07-09 DIAGNOSIS — I4891 Unspecified atrial fibrillation: Secondary | ICD-10-CM | POA: Diagnosis present

## 2015-07-09 DIAGNOSIS — I10 Essential (primary) hypertension: Secondary | ICD-10-CM | POA: Diagnosis not present

## 2015-07-09 DIAGNOSIS — I429 Cardiomyopathy, unspecified: Secondary | ICD-10-CM | POA: Diagnosis present

## 2015-07-09 DIAGNOSIS — R05 Cough: Secondary | ICD-10-CM | POA: Diagnosis not present

## 2015-07-09 DIAGNOSIS — E785 Hyperlipidemia, unspecified: Secondary | ICD-10-CM | POA: Diagnosis present

## 2015-07-09 DIAGNOSIS — C3401 Malignant neoplasm of right main bronchus: Secondary | ICD-10-CM | POA: Diagnosis present

## 2015-07-09 DIAGNOSIS — I480 Paroxysmal atrial fibrillation: Secondary | ICD-10-CM | POA: Diagnosis not present

## 2015-07-09 DIAGNOSIS — Z7982 Long term (current) use of aspirin: Secondary | ICD-10-CM

## 2015-07-09 DIAGNOSIS — R0600 Dyspnea, unspecified: Secondary | ICD-10-CM | POA: Diagnosis not present

## 2015-07-09 DIAGNOSIS — R0902 Hypoxemia: Secondary | ICD-10-CM | POA: Diagnosis present

## 2015-07-09 DIAGNOSIS — Z951 Presence of aortocoronary bypass graft: Secondary | ICD-10-CM

## 2015-07-09 DIAGNOSIS — J9809 Other diseases of bronchus, not elsewhere classified: Secondary | ICD-10-CM | POA: Diagnosis present

## 2015-07-09 DIAGNOSIS — R059 Cough, unspecified: Secondary | ICD-10-CM

## 2015-07-09 DIAGNOSIS — I350 Nonrheumatic aortic (valve) stenosis: Secondary | ICD-10-CM | POA: Diagnosis present

## 2015-07-09 DIAGNOSIS — I110 Hypertensive heart disease with heart failure: Secondary | ICD-10-CM | POA: Diagnosis present

## 2015-07-09 DIAGNOSIS — Z87891 Personal history of nicotine dependence: Secondary | ICD-10-CM

## 2015-07-09 DIAGNOSIS — Z510 Encounter for antineoplastic radiation therapy: Secondary | ICD-10-CM | POA: Diagnosis present

## 2015-07-09 DIAGNOSIS — Z953 Presence of xenogenic heart valve: Secondary | ICD-10-CM | POA: Diagnosis not present

## 2015-07-09 DIAGNOSIS — Z95810 Presence of automatic (implantable) cardiac defibrillator: Secondary | ICD-10-CM | POA: Diagnosis not present

## 2015-07-09 DIAGNOSIS — I5022 Chronic systolic (congestive) heart failure: Secondary | ICD-10-CM | POA: Diagnosis present

## 2015-07-09 DIAGNOSIS — C3490 Malignant neoplasm of unspecified part of unspecified bronchus or lung: Secondary | ICD-10-CM

## 2015-07-09 DIAGNOSIS — I340 Nonrheumatic mitral (valve) insufficiency: Secondary | ICD-10-CM | POA: Diagnosis present

## 2015-07-09 DIAGNOSIS — Z66 Do not resuscitate: Secondary | ICD-10-CM | POA: Diagnosis present

## 2015-07-09 DIAGNOSIS — Z79899 Other long term (current) drug therapy: Secondary | ICD-10-CM

## 2015-07-09 DIAGNOSIS — C3491 Malignant neoplasm of unspecified part of right bronchus or lung: Secondary | ICD-10-CM | POA: Diagnosis not present

## 2015-07-09 DIAGNOSIS — Z952 Presence of prosthetic heart valve: Secondary | ICD-10-CM | POA: Diagnosis not present

## 2015-07-09 LAB — COMPREHENSIVE METABOLIC PANEL
ALT: 15 U/L — AB (ref 17–63)
AST: 21 U/L (ref 15–41)
Albumin: 3.7 g/dL (ref 3.5–5.0)
Alkaline Phosphatase: 76 U/L (ref 38–126)
Anion gap: 7 (ref 5–15)
BILIRUBIN TOTAL: 0.9 mg/dL (ref 0.3–1.2)
BUN: 21 mg/dL — AB (ref 6–20)
CO2: 24 mmol/L (ref 22–32)
CREATININE: 1.01 mg/dL (ref 0.61–1.24)
Calcium: 9.2 mg/dL (ref 8.9–10.3)
Chloride: 103 mmol/L (ref 101–111)
Glucose, Bld: 123 mg/dL — ABNORMAL HIGH (ref 65–99)
Potassium: 3.7 mmol/L (ref 3.5–5.1)
Sodium: 134 mmol/L — ABNORMAL LOW (ref 135–145)
TOTAL PROTEIN: 6.9 g/dL (ref 6.5–8.1)

## 2015-07-09 LAB — URINALYSIS, ROUTINE W REFLEX MICROSCOPIC
Bilirubin Urine: NEGATIVE
Glucose, UA: NEGATIVE mg/dL
HGB URINE DIPSTICK: NEGATIVE
Ketones, ur: 15 mg/dL — AB
LEUKOCYTES UA: NEGATIVE
NITRITE: NEGATIVE
PROTEIN: NEGATIVE mg/dL
Specific Gravity, Urine: 1.02 (ref 1.005–1.030)
pH: 6.5 (ref 5.0–8.0)

## 2015-07-09 LAB — CBC WITH DIFFERENTIAL/PLATELET
BASOS ABS: 0 10*3/uL (ref 0.0–0.1)
BASOS PCT: 0 %
EOS ABS: 0.2 10*3/uL (ref 0.0–0.7)
EOS PCT: 1 %
HCT: 42.3 % (ref 39.0–52.0)
Hemoglobin: 14.1 g/dL (ref 13.0–17.0)
Lymphocytes Relative: 15 %
Lymphs Abs: 1.7 10*3/uL (ref 0.7–4.0)
MCH: 30.8 pg (ref 26.0–34.0)
MCHC: 33.3 g/dL (ref 30.0–36.0)
MCV: 92.4 fL (ref 78.0–100.0)
Monocytes Absolute: 1 10*3/uL (ref 0.1–1.0)
Monocytes Relative: 9 %
NEUTROS PCT: 75 %
Neutro Abs: 8.4 10*3/uL — ABNORMAL HIGH (ref 1.7–7.7)
PLATELETS: 224 10*3/uL (ref 150–400)
RBC: 4.58 MIL/uL (ref 4.22–5.81)
RDW: 14.7 % (ref 11.5–15.5)
WBC: 11.2 10*3/uL — AB (ref 4.0–10.5)

## 2015-07-09 MED ORDER — IPRATROPIUM-ALBUTEROL 0.5-2.5 (3) MG/3ML IN SOLN
3.0000 mL | Freq: Four times a day (QID) | RESPIRATORY_TRACT | Status: DC
  Administered 2015-07-10 – 2015-07-13 (×12): 3 mL via RESPIRATORY_TRACT
  Filled 2015-07-09 (×14): qty 3

## 2015-07-09 MED ORDER — DOCUSATE SODIUM 100 MG PO CAPS
100.0000 mg | ORAL_CAPSULE | Freq: Two times a day (BID) | ORAL | Status: DC
  Administered 2015-07-09 – 2015-07-13 (×8): 100 mg via ORAL
  Filled 2015-07-09 (×8): qty 1

## 2015-07-09 MED ORDER — SODIUM CHLORIDE 0.45 % IV SOLN
INTRAVENOUS | Status: DC
  Administered 2015-07-09: 21:00:00 via INTRAVENOUS
  Administered 2015-07-10: 1000 mL via INTRAVENOUS
  Administered 2015-07-11 – 2015-07-12 (×3): via INTRAVENOUS

## 2015-07-09 MED ORDER — ALBUTEROL SULFATE (2.5 MG/3ML) 0.083% IN NEBU
2.5000 mg | INHALATION_SOLUTION | RESPIRATORY_TRACT | Status: DC | PRN
Start: 2015-07-09 — End: 2015-07-13

## 2015-07-09 MED ORDER — LOSARTAN POTASSIUM 50 MG PO TABS
100.0000 mg | ORAL_TABLET | Freq: Every day | ORAL | Status: DC
  Administered 2015-07-10: 100 mg via ORAL
  Filled 2015-07-09: qty 2

## 2015-07-09 MED ORDER — METOPROLOL TARTRATE 25 MG PO TABS
25.0000 mg | ORAL_TABLET | Freq: Two times a day (BID) | ORAL | Status: DC
  Administered 2015-07-09 – 2015-07-13 (×4): 25 mg via ORAL
  Filled 2015-07-09 (×8): qty 1

## 2015-07-09 MED ORDER — HYDROCODONE-ACETAMINOPHEN 5-325 MG PO TABS
1.0000 | ORAL_TABLET | ORAL | Status: DC | PRN
  Administered 2015-07-10: 1 via ORAL
  Administered 2015-07-11 – 2015-07-13 (×8): 2 via ORAL
  Filled 2015-07-09 (×8): qty 2
  Filled 2015-07-09: qty 1

## 2015-07-09 MED ORDER — ACETAMINOPHEN 500 MG PO TABS: 500.0000 mg | ORAL_TABLET | Freq: Four times a day (QID) | ORAL | Status: DC | PRN

## 2015-07-09 MED ORDER — MENTHOL 3 MG MT LOZG
1.0000 | LOZENGE | OROMUCOSAL | Status: DC | PRN
  Administered 2015-07-09: 3 mg via ORAL
  Filled 2015-07-09 (×2): qty 9

## 2015-07-09 MED ORDER — POLYETHYLENE GLYCOL 3350 17 G PO PACK
17.0000 g | PACK | ORAL | Status: DC
  Administered 2015-07-10 – 2015-07-12 (×2): 17 g via ORAL
  Filled 2015-07-09 (×2): qty 1

## 2015-07-09 MED ORDER — ASPIRIN EC 81 MG PO TBEC
81.0000 mg | DELAYED_RELEASE_TABLET | Freq: Every day | ORAL | Status: DC
  Administered 2015-07-09 – 2015-07-13 (×4): 81 mg via ORAL
  Filled 2015-07-09 (×5): qty 1

## 2015-07-09 MED ORDER — PANTOPRAZOLE SODIUM 40 MG PO TBEC
40.0000 mg | DELAYED_RELEASE_TABLET | Freq: Every day | ORAL | Status: DC
  Administered 2015-07-09 – 2015-07-13 (×5): 40 mg via ORAL
  Filled 2015-07-09 (×5): qty 1

## 2015-07-09 MED ORDER — AMIODARONE HCL 200 MG PO TABS
200.0000 mg | ORAL_TABLET | Freq: Every day | ORAL | Status: DC
  Administered 2015-07-10 – 2015-07-13 (×4): 200 mg via ORAL
  Filled 2015-07-09 (×4): qty 1

## 2015-07-09 MED ORDER — FLEET ENEMA 7-19 GM/118ML RE ENEM: 1.0000 | ENEMA | Freq: Once | RECTAL | Status: DC | PRN

## 2015-07-09 MED ORDER — IPRATROPIUM-ALBUTEROL 0.5-2.5 (3) MG/3ML IN SOLN
3.0000 mL | Freq: Four times a day (QID) | RESPIRATORY_TRACT | Status: DC
  Administered 2015-07-09: 3 mL via RESPIRATORY_TRACT
  Filled 2015-07-09: qty 3

## 2015-07-09 MED ORDER — ENOXAPARIN SODIUM 40 MG/0.4ML ~~LOC~~ SOLN
40.0000 mg | SUBCUTANEOUS | Status: DC
  Administered 2015-07-09 – 2015-07-12 (×4): 40 mg via SUBCUTANEOUS
  Filled 2015-07-09 (×4): qty 0.4

## 2015-07-09 MED ORDER — GUAIFENESIN-CODEINE 100-10 MG/5ML PO SOLN
5.0000 mL | ORAL | Status: DC | PRN
  Administered 2015-07-10: 5 mL via ORAL
  Administered 2015-07-11 (×2): 10 mL via ORAL
  Administered 2015-07-11: 5 mL via ORAL
  Administered 2015-07-11 – 2015-07-13 (×4): 10 mL via ORAL
  Administered 2015-07-13: 5 mL via ORAL
  Filled 2015-07-09 (×2): qty 10
  Filled 2015-07-09: qty 5
  Filled 2015-07-09 (×6): qty 10

## 2015-07-09 MED ORDER — HYDROCOD POLST-CPM POLST ER 10-8 MG/5ML PO SUER: 5.0000 mL | Freq: Two times a day (BID) | ORAL | Status: DC

## 2015-07-09 MED ORDER — SIMVASTATIN 10 MG PO TABS
5.0000 mg | ORAL_TABLET | Freq: Every evening | ORAL | Status: DC
  Administered 2015-07-09 – 2015-07-12 (×4): 5 mg via ORAL
  Filled 2015-07-09 (×4): qty 1

## 2015-07-09 NOTE — H&P (Signed)
[facility] History and Physical  Gabriel Marsh RFF:[id number] DOB: 1930/02/18 DOA: 07/09/2015  Referring physician: [facility] PCP: [name], MD   Chief Complaint: Cough w lung cancer and broncial near occlusion  HPI: Gabriel Marsh is a 82 y.o. male with hx of CABG / AVR biovalve, afib, HTN, CM w EF 25%/ AICD, sent for admission from [name] in [place] due to near occlusion of R mainstem bronchus due to infiltrating tumor/ cancer.    Patient and niece provide history, she has medical POA.  Pt has had cough about 1 year, bloody cough about 2-3 mos.  Dx'd 3 wks ago w lung mass and today underwent bronchoscopy.     Bronch report from today > high grade occlusion at origin of R mainstem bronchus w diffuse tumor infiltrating the distal trachea and main carina. Origin of R mainstem bronchus was occluded to about 5 mm diameter. Path is pending. Pt transferred for radiation.    Pt has lost strength in the last several mos.  Too weak to drive now, some wt loss not a lot.  +bad cough and wheezing too which is new.  Nonprod cough, no f/c/s, no abd pain , no n/v/d. Takes qod Glycolax for constipation.  No voiding issues.  No joint pain or skin probs.    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  Was in the [place] in [place] shortly after the conflict there in the 35'T.  Never married, no kids, lives in [place] in a [place].  Using a cane lately, was stronger 6 mos ago.  Niece here has POA and lives in [place].  He has a living well and wants to "go in peace" according to the niece.     ROS  denies CP  no joint pain   no HA  no blurry vision  no rash  no diarrhea  no nausea/ vomiting  no dysuria  no difficulty voiding  no change in urine color     Past Medical History  Past Medical History  Diagnosis Date  . Hypertension   . Hyperlipidemia   . Mitral regurgitation     Moderate by 08/20/11 echo  . Shortness of breath   . CAD (coronary artery disease)     a) 08/22/11 cath :  50-60% left main stenosis, mild-moderate LAD disease, 99% ostial large diagonal stenosis, occluded and collateralized LCx and RCA  . Aortic stenosis     Severe by 08/20/11 echo  . Aortic insufficiency     Moderate by 08/20/11 echo  . Chronic systolic CHF (congestive heart failure)     a) 08/20/11 echo : LVEF 35-40%   Past Surgical History  Past Surgical History  Procedure Laterality Date  . Eye surgery    . Cardiac catheterization  08/22/11     50-60% left main stenosis, mild-moderate LAD disease, 99% ostial large diagonal stenosis, occluded and collateralized LCx and RCA  . Coronary artery bypass graft  08/25/2011    Procedure: CORONARY ARTERY BYPASS GRAFTING (CABG);  Surgeon: Grace Isaac, MD;  Location: [facility];  Service: Open Heart Surgery;  Laterality: N/A;  Hot and humid all through surgery.  . Aortic valve replacement  08/25/2011    Procedure: AORTIC VALVE REPLACEMENT (AVR);  Surgeon: Percell Miller  Maryruth Bun, MD;  Location: [facility];  Service: Open Heart Surgery;  Laterality: N/A;  hot and humid all through surgery.  . Intra-aortic balloon pump insertion N/A 08/24/2011    Procedure: INTRA-AORTIC BALLOON PUMP INSERTION;  Surgeon: Peter M Martinique, MD;  Location: [facility] C[facility] LAB;  Service: Cardiovascular;  Laterality: N/A;  . Implantable cardioverter defibrillator implant N/A 09/06/2011    Procedure: IMPLANTABLE CARDIOVERTER DEFIBRILLATOR IMPLANT;  Surgeon: Evans Lance, MD;  Location: [facility] C[facility] LAB;  Service: Cardiovascular;  Laterality: N/A;   Family History  Family History  Problem Relation Age of Onset  . Heart disease Brother    Social History  reports that he quit smoking about 20 years ago. He does not have any smokeless tobacco history on file. He  reports that he does not drink alcohol or use illicit drugs. Allergies  Allergies  Allergen Reactions  . Nifedipine Other (See Comments)    edema  . Fish Allergy Nausea And Vomiting  . Lisinopril     cough  . Penicillins Hives    Has patient had a PCN reaction causing immediate rash, facial/tongue/throat swelling, SOB or lightheadedness with hypotension: Yes Has patient had a PCN reaction causing severe rash involving mucus membranes or skin necrosis: Yes Has patient had a PCN reaction that required hospitalization Unknown Has patient had a PCN reaction occurring within the last 10 years: No If all of the above answers are "NO", then may proceed with Cephalosporin use.   . Statins     Leg cramps  . Sulfa Antibiotics     Hives    Home medications Prior to Admission medications   Medication Sig Start Date End Date Taking? Authorizing Provider  amiodarone (PACERONE) 200 MG tablet Take 1 tablet (200 mg total) by mouth daily. 12/29/11 12/28/12  Evans Lance, MD  aspirin 325 MG EC tablet Take 325 mg by mouth daily.    Historical Provider, MD  Ferrous Sulfate (FEROSUL PO) Take 325 capsules by mouth daily.    Historical Provider, MD  metoprolol tartrate (LOPRESSOR) 25 MG tablet Take 1 tablet (25 mg total) by mouth 2 (two) times daily. 09/16/11 09/15/12  Lavon Paganini Angiulli, PA-C  simvastatin (ZOCOR) 5 MG tablet Take 1 tablet (5 mg total) by mouth daily at 6 PM. 09/16/11 09/15/12  Lavon Paganini Angiulli, PA-C  spironolactone (ALDACTONE) 25 MG tablet Take 1 tablet (25 mg total) by mouth daily. 09/16/11 09/15/12  Lavon Paganini Angiulli, PA-C   Liver Function Tests No results for input(s): AST, ALT, ALKPHOS, BILITOT, PROT, ALBUMIN in the last 168 hours. No results for input(s): LIPASE, AMYLASE in the last 168 hours. CBC No results for input(s): WBC, NEUTROABS, HGB, HCT, MCV, PLT in the last 168 hours. Basic Metabolic Panel No results for input(s): NA, K, CL, CO2, GLUCOSE, BUN, CREATININE, CALCIUM, PHOS in  the last 168 hours.  Invalid input(s): ALB   Filed Vitals:   07/09/15 1857  BP: 124/78  Pulse: 71  Temp: 97.8 F (36.6 C)  TempSrc: Oral  Resp: 18  SpO2: 98%   Exam: Gen alert elderly WM No rash, cyanosis or gangrene Sclera anicteric, throat clear No jvd or bruits Chest diffuse mild-mod exp wheezing, scatterd RRR no MRG Abd soft ntnd no mass or ascites +bs GU normal male MS no joint effusions or deformity Ext no LE edema / no wounds or ulcers Neuro is alert, Ox 3 , nf  Labs - pending EKG (independ reviewed) > NSR , incomp LBBB, PVC's  Assessment: 1  Lung cancer/ R mainstem bronchus near-occlusion - per bronch done today. Sent here for radiation 2  Cough - due to #1 3  Debility - due to #1 4  HTN - cont meds 5  CABG/ AVR biovalve 2013 / CM EF 25% / AICD 6  Afib on amiodarone, in NSR 7  DNR   Plan - cont usual meds, get baseline labs and CXR.  Consult rad oncology in am.  Nebs/ O2 as needed. DNR     Sol Blazing [facility] Pager [phone]  Cell [phone]  If 7PM-7AM, please contact night-coverage [email or web address] Password [facility] 07/09/2015, 7:14 PM

## 2015-07-10 ENCOUNTER — Telehealth: Payer: Self-pay | Admitting: Oncology

## 2015-07-10 ENCOUNTER — Ambulatory Visit
Admit: 2015-07-10 | Discharge: 2015-07-10 | Disposition: A | Discharge status: Home / Self Care | Payer: No Typology Code available for payment source | Attending: Radiation Oncology | Admitting: Radiation Oncology

## 2015-07-10 ENCOUNTER — Telehealth: Payer: Self-pay | Admitting: Radiation Oncology

## 2015-07-10 ENCOUNTER — Other Ambulatory Visit: Payer: Self-pay | Admitting: Oncology

## 2015-07-10 ENCOUNTER — Telehealth: Payer: Self-pay | Admitting: *Deleted

## 2015-07-10 ENCOUNTER — Encounter (HOSPITAL_COMMUNITY): Payer: Self-pay

## 2015-07-10 DIAGNOSIS — C3401 Malignant neoplasm of right main bronchus: Principal | ICD-10-CM

## 2015-07-10 DIAGNOSIS — R0600 Dyspnea, unspecified: Secondary | ICD-10-CM

## 2015-07-10 DIAGNOSIS — R05 Cough: Secondary | ICD-10-CM

## 2015-07-10 DIAGNOSIS — Z510 Encounter for antineoplastic radiation therapy: Secondary | ICD-10-CM | POA: Insufficient documentation

## 2015-07-10 DIAGNOSIS — R042 Hemoptysis: Secondary | ICD-10-CM

## 2015-07-10 DIAGNOSIS — J9809 Other diseases of bronchus, not elsewhere classified: Secondary | ICD-10-CM

## 2015-07-10 DIAGNOSIS — I2510 Atherosclerotic heart disease of native coronary artery without angina pectoris: Secondary | ICD-10-CM

## 2015-07-10 DIAGNOSIS — Z66 Do not resuscitate: Secondary | ICD-10-CM

## 2015-07-10 DIAGNOSIS — Z954 Presence of other heart-valve replacement: Secondary | ICD-10-CM

## 2015-07-10 DIAGNOSIS — I10 Essential (primary) hypertension: Secondary | ICD-10-CM | POA: Insufficient documentation

## 2015-07-10 DIAGNOSIS — Z952 Presence of prosthetic heart valve: Secondary | ICD-10-CM | POA: Insufficient documentation

## 2015-07-10 DIAGNOSIS — C3491 Malignant neoplasm of unspecified part of right bronchus or lung: Secondary | ICD-10-CM

## 2015-07-10 NOTE — Care Management Note (Signed)
Case Management Note  Patient Details  Name: Gabriel Marsh MRN: [id number] Date of Birth: 10/08/1930  Subjective/Objective:  82 y/o m admitted w/Bronchial near occlusion. Hx: Lung ca. From  home.  PT cons-await recc.             Action/Plan:d/c plan home.   Expected Discharge Date:                  Expected Discharge Plan:  Home/Self Care  In-House Referral:     Discharge planning Services  CM Consult  Post Acute Care Choice:    Choice offered to:     DME Arranged:    DME Agency:     HH Arranged:    HH Agency:     Status of Service:  In process, will continue to follow  Medicare Important Message Given:    Date Medicare IM Given:    Medicare IM give by:    Date Additional Medicare IM Given:    Additional Medicare Important Message give by:     If discussed at [place] of Stay Meetings, dates discussed:    Additional Comments:  Dessa Phi, RN 07/10/2015, 3:33 PM

## 2015-07-10 NOTE — Telephone Encounter (Signed)
I spoke with the patient's niece this morning who supplied contact information to several of the patient's doctor's offices and reviewed his workup thusfar and explained the logistics of his care.

## 2015-07-10 NOTE — Telephone Encounter (Signed)
Called Dr. Renee Rival MD in [place], [phone] per Shona Simpson, [place], we need to treat the patient on Monday 07/13/15 Riht chest, he has a defibrillator/ICD , need fax number  To have MD sign and send back to [place] ASAP, their fax number is :[phone], per Hoyle Sauer  And send attention to the RN named Caroyln also, she will give to another MD since Dr. Renee Rival is ourt of the office today, faxed and got confirmation of faxed pacemaker form  10:19 AM

## 2015-07-10 NOTE — Telephone Encounter (Signed)
Hoyle Sauer RN form Dr. Renee Rival office called and faxed back pacemaker form, then called [phone],spoke with Angelita Ingles who took all information to give to Aaron Edelman the rep for [facility],  To have patient's device checked before initial treatment scheduled this Monday  1:40 PM

## 2015-07-10 NOTE — Progress Notes (Signed)
[facility]  PROGRESS NOTE  Archer Moist PYP:[id number] DOB: [birth year] DOA: 07/09/2015 PCP: [name], MD    Brief HPI:  82 y.o. male with hx of CABG / AVR biovalve, afib, HTN, CM w EF 25%/ AICD, sent for admission from [name] in [place] due to near occlusion of R mainstem bronchus due to infiltrating tumor/ cancer.   Patient and niece provide history, she has medical POA. Pt has had cough about 1 year, bloody cough about 2-3 mos. Dx'd 3 wks ago w lung mass and today underwent bronchoscopy.   Bronch report from today > high grade occlusion at origin of R mainstem bronchus w diffuse tumor infiltrating the distal trachea and main carina. Origin of R mainstem bronchus was occluded to about 5 mm diameter. Path is pending. Pt transferred for radiation.   Principal Problem:   Lung cancer [facility]) Active Problems:   Hypertension   Atrial fibrillation (HCC)   S/P aortic valve replacement with bioprosthetic valve   Bronchus, R mainstem stenosis   S/P CABG x 3,  2458   Chronic systolic heart failure ([place])   DNR (do not resuscitate)   Cough   Mainstem bronchial stenosis   Assessment/Plan: 1. Lung cancer, right mainstem bronchus near occlusion- patient underwent drawn yesterday [facility], and transferred for radiation treatment. We will consult radiation oncology for further management. Continue DuoNeb nebulizers 4 times a day 2. Cough- likely from above. Start codeine cough syrup when necessary. 3. Hypertension- Continue metoprolol 25 mg twice a day, losartan 100 mg daily 4. History of CABG/aortic valve replacement bioprosthetic/AICD - stable    DVT prophylaxis: Lovenox Code Status: DO NOT RESUSCITATE Family Communication: No family at bedside Disposition Plan: Pending evaluation by oncology and radiation oncology   Consultants:  None  Procedures:  None  Antibiotics:  None  Subjective: Patient seen and examined. Coughing up phlegm.  Objective: Filed  Vitals:   07/10/15 0543 07/10/15 0920 07/10/15 0959 07/10/15 1335  BP: 100/64  116/63 99/53  Pulse: 82  75 75  Temp: 98 F (36.7 C)   98.4 F (36.9 C)  TempSrc: Oral   Oral  Resp: 20   14  SpO2: 99% 94%  97%    Intake/Output Summary (Last 24 hours) at 07/10/15 1501 Last data filed at 07/10/15 1405  Gross per 24 hour  Intake   1025 ml  Output    300 ml  Net    725 ml   There were no vitals filed for this visit.  Examination:  General exam: Appears calm and comfortable  Respiratory system: Bilateral rhonchi Cardiovascular system: S1 & S2 heard, RRR. No JVD, murmurs, rubs, gallops or clicks. No pedal edema. Gastrointestinal system: Abdomen is nondistended, soft and nontender. No organomegaly or masses felt. Normal bowel sounds heard. Central nervous system: Alert and oriented. No focal neurological deficits. Extremities: Symmetric 5 x 5 power. Skin: No rashes, lesions or ulcers Psychiatry: Judgement and insight appear normal. Mood & affect appropriate.    Data Reviewed: I have personally reviewed following labs and imaging studies Basic Metabolic Panel:  Recent Labs Lab 07/09/15 2132  NA 134*  K 3.7  CL 103  CO2 24  GLUCOSE 123*  BUN 21*  CREATININE 1.01  CALCIUM 9.2   Liver Function Tests:  Recent Labs Lab 07/09/15 2132  AST 21  ALT 15*  ALKPHOS 76  BILITOT 0.9  PROT 6.9  ALBUMIN 3.7    CBC:  Recent Labs Lab 07/09/15 2132  WBC 11.2*  NEUTROABS 8.4*  HGB 14.1  HCT 42.3  MCV 92.4  PLT 224     Studies: X-ray Chest Pa And Lateral  07/09/2015  CLINICAL DATA:  Known right-sided lung carcinoma EXAM: CHEST  2 VIEW COMPARISON:  07/03/2015 FINDINGS: Cardiac shadow is within normal limits. The known central and right-sided mass lesion is not well appreciated due to overlying mediastinal shadows. Postsurgical changes are seen. A defibrillator is again noted. No focal infiltrate or sizable effusion is seen. IMPRESSION: No new acute abnormality is noted.  The known mass centrally on the right is not well appreciated due to the tortuous aorta. Electronically Signed   By: [name] M.D.   On: 07/09/2015 21:57    Scheduled Meds: . amiodarone  200 mg Oral Daily  . aspirin EC  81 mg Oral Daily  . docusate sodium  100 mg Oral BID  . enoxaparin (LOVENOX) injection  40 mg Subcutaneous Q24H  . ipratropium-albuterol  3 mL Nebulization QID  . losartan  100 mg Oral Daily  . metoprolol tartrate  25 mg Oral BID  . pantoprazole  40 mg Oral Daily  . polyethylene glycol  17 g Oral QODAY  . simvastatin  5 mg Oral QPM   Continuous Infusions: . sodium chloride 1,000 mL (07/10/15 1333)       Time spent: 25 min    [facility] Pager [phone]. If 7PM-7AM, please contact night-coverage at [email or web address], Office  [phone]  password [email or web address] 07/10/2015, 3:01 PM  LOS: 1 day

## 2015-07-10 NOTE — Consult Note (Signed)
Radiation Oncology         [phone] ________________________________  Name: Gabriel Marsh MRN: [id number]  Date: 07/09/2015  DOB: [birth year]  ZJ:IRCV,ELFY, MD  No ref. provider found     REFERRING PHYSICIAN: No ref. provider found   DIAGNOSIS: The encounter diagnosis was Lung cancer ([place]).   HISTORY OF PRESENT ILLNESS: Gabriel Marsh is a 82 y.o. male seen at the request of Dr. Darrick Meigs for new lung cancer causing collapse of the right lung with hypoxia and cough. The patient reports that he has noticed an ongoing cough since the beginning of the year. His symptoms worsened which ultimately led to a CT scan of the chest at the [name], which I do not have access to at this time. His niece who is his HCPOA and next of kin brought him to [facility] on 07/03/15 for a PET scan. This revealed a 3.9-3.5 cm Right lung mass along the bronchus intermedius with an SUV of 30.6. This extends superiorly and posterior to the carina with infiltrative pattern and involvemenet of the mediastinum.  There is no hypermetabolic adenopathy or evidence of distant metastasis. He then had a bronchoscopy with biopsy yesterday in [place] and this revealed squamous cell carcinoma, pathology is being faxed but this diagnosis was per Dr. Jimmye Norman, the medical oncologist he met with last week.   During his bronchoscopy, he had significant collapse of his right lung and there was oozing of the tumor after the biopsy. Because of these concerns, his pulmonologist advised the patient to go to the ED for hospital admission until he could receive radiotherapy. As his niece lives in [place], she elected to have him seen here at [facility]. We are asked to see him today to discuss the options for radiotherapy treatment with Dr. Lisbeth Renshaw.   PREVIOUS RADIATION THERAPY: No   PAST MEDICAL HISTORY:  Past Medical History  Diagnosis Date  . Hypertension   . Hyperlipidemia   . Mitral regurgitation     Moderate by 08/20/11 echo    . Shortness of breath   . CAD (coronary artery disease)     a) 08/22/11 cath :  50-60% left main stenosis, mild-moderate LAD disease, 99% ostial large diagonal stenosis, occluded and collateralized LCx and RCA  . Aortic stenosis     Severe by 08/20/11 echo  . Aortic insufficiency     Moderate by 08/20/11 echo  . Chronic systolic CHF (congestive heart failure) ([place])     a) 08/20/11 echo : LVEF 35-40%       PAST SURGICAL HISTORY: Past Surgical History  Procedure Laterality Date  . Eye surgery    . Cardiac catheterization  08/22/11     50-60% left main stenosis, mild-moderate LAD disease, 99% ostial large diagonal stenosis, occluded and collateralized LCx and RCA  . Coronary artery bypass graft  08/25/2011    Procedure: CORONARY ARTERY BYPASS GRAFTING (CABG);  Surgeon: Grace Isaac, MD;  Location: [place];  Service: Open Heart Surgery;  Laterality: N/A;  Hot and humid all through surgery.  . Aortic valve replacement  08/25/2011    Procedure: AORTIC VALVE REPLACEMENT (AVR);  Surgeon: Grace Isaac, MD;  Location: [place] [facility];  Service: Open Heart Surgery;  Laterality: N/A;  hot and humid all through surgery.  . Intra-aortic balloon pump insertion N/A 08/24/2011    Procedure: INTRA-AORTIC BALLOON PUMP INSERTION;  Surgeon: Peter M Martinique, MD;  Location: [facility] CATH LAB;  Service: Cardiovascular;  Laterality: N/A;  . Implantable cardioverter defibrillator  implant N/A 09/06/2011    Procedure: IMPLANTABLE CARDIOVERTER DEFIBRILLATOR IMPLANT;  Surgeon: Evans Lance, MD;  Location: [facility] CATH LAB;  Service: Cardiovascular;  Laterality: N/A;     FAMILY HISTORY:  Family History  Problem Relation Age of Onset  . Heart disease Brother      SOCIAL HISTORY:  reports that he quit smoking about 20 years ago. He does not have any smokeless tobacco history on file. He reports that he does not drink alcohol or use illicit drugs. The patient is retired from working in [employer]. He served in [employer]  in [place]. He never married and lives alone in [place]. His niece [name], who is 70 years younger, is his next of kin and also his HCPOA   ALLERGIES: Nifedipine; Fish allergy; Lisinopril; Penicillins; Statins; and Sulfa antibiotics   MEDICATIONS:  Current Facility-Administered Medications  Medication Dose Route Frequency Provider Last Rate Last Dose  . 0.45 % sodium chloride infusion   Intravenous Continuous Roney Jaffe, MD 50 mL/hr at 07/09/15 2105    . acetaminophen (TYLENOL) tablet 500-1,000 mg  500-1,000 mg Oral Q6H PRN Roney Jaffe, MD      . albuterol (PROVENTIL) (2.5 MG/3ML) 0.083% nebulizer solution 2.5 mg  2.5 mg Nebulization Q4H PRN Roney Jaffe, MD      . amiodarone (PACERONE) tablet 200 mg  200 mg Oral Daily Roney Jaffe, MD   200 mg at 07/10/15 1000  . aspirin EC tablet 81 mg  81 mg Oral Daily Roney Jaffe, MD   81 mg at 07/09/15 2134  . docusate sodium (COLACE) capsule 100 mg  100 mg Oral BID Roney Jaffe, MD   100 mg at 07/10/15 0959  . enoxaparin (LOVENOX) injection 40 mg  40 mg Subcutaneous Q24H Roney Jaffe, MD   40 mg at 07/09/15 2134  . guaiFENesin-codeine 100-10 MG/5ML solution 5-10 mL  5-10 mL Oral Q4H PRN Roney Jaffe, MD      . HYDROcodone-acetaminophen (NORCO/VICODIN) 5-325 MG per tablet 1-2 tablet  1-2 tablet Oral Q4H PRN Roney Jaffe, MD      . ipratropium-albuterol (DUONEB) 0.5-2.5 (3) MG/3ML nebulizer solution 3 mL  3 mL Nebulization QID Roney Jaffe, MD   3 mL at 07/10/15 0919  . losartan (COZAAR) tablet 100 mg  100 mg Oral Daily Roney Jaffe, MD   100 mg at 07/10/15 0959  . menthol-cetylpyridinium (CEPACOL) lozenge 3 mg  1 lozenge Oral PRN Roney Jaffe, MD   3 mg at 07/09/15 2110  . metoprolol tartrate (LOPRESSOR) tablet 25 mg  25 mg Oral BID Roney Jaffe, MD   25 mg at 07/10/15 1000  . pantoprazole (PROTONIX) EC tablet 40 mg  40 mg Oral Daily Roney Jaffe, MD   40 mg at 07/10/15 0959  . polyethylene glycol (MIRALAX / GLYCOLAX)  packet 17 g  17 g Oral QODAY Roney Jaffe, MD   17 g at 07/10/15 0959  . simvastatin (ZOCOR) tablet 5 mg  5 mg Oral QPM Roney Jaffe, MD   5 mg at 07/09/15 2136  . sodium phosphate (FLEET) 7-19 GM/118ML enema 1 enema  1 enema Rectal Once PRN Roney Jaffe, MD         REVIEW OF SYSTEMS: On review of systems, the patient reports that he is doing better since coming into the hospital. He denies any chest pain, fevers, chills, night sweats. He has lost about 15 pounds in the last two months and has been trying to lose weight with dietary changes. He is  having some difficulty with pain when swallowing tough foods. He has been enjoying ice cream and ensures because of this. He continues to cough frequently and has had a few episodes in the last 2-3 months with hemoptysis, though states this is less than a teaspoon of blood at a time.  He denies any bowel or bladder disturbances, and denies abdominal pain, nausea or vomiting. He denies any new musculoskeletal or joint aches or pains. A complete review of systems is obtained and is otherwise negative.     PHYSICAL EXAM:  oral temperature is 98 F (36.7 C). His blood pressure is 116/63 and his pulse is 75. His respiration is 20 and oxygen saturation is 94%.   Pain scale 0/10 In general this is a Caucasian male who appears younger than his stated age, in no acute distress. He is alert and oriented x4 and appropriate throughout the examination. HEENT reveals that the patient is normocephalic, atraumatic. EOMs are intact. PERRLA. Skin is intact without any evidence of gross lesions. Cardiovascular exam reveals a regular rate and rhythm, no clicks rubs or murmurs are auscultated. Chest is clear to auscultation bilaterally. Lymphatic assessment is performed and does not reveal any adenopathy in the cervical, supraclavicular, axillary, or inguinal chains. Abdomen has active bowel sounds in all quadrants and is intact. The abdomen is soft, non tender, non  distended. Lower extremities are negative for pretibial pitting edema, deep calf tenderness, cyanosis or clubbing.   ECOG = 2 0 - Asymptomatic (Fully active, able to carry on all predisease activities without restriction)  1 - Symptomatic but completely ambulatory (Restricted in physically strenuous activity but ambulatory and able to carry out work of a light or sedentary nature. For example, light housework, office work)  2 - Symptomatic, <50% in bed during the day (Ambulatory and capable of all self care but unable to carry out any work activities. Up and about more than 50% of waking hours)  3 - Symptomatic, >50% in bed, but not bedbound (Capable of only limited self-care, confined to bed or chair 50% or more of waking hours)  4 - Bedbound (Completely disabled. Cannot carry on any self-care. Totally confined to bed or chair)  5 - Death   Eustace Pen MM, Creech RH, Tormey DC, et al. [phone]). "Toxicity and response criteria of the [facility]". [place] Oncol. 5 (6): 649-55    LABORATORY DATA:  Lab Results  Component Value Date   WBC 11.2* 07/09/2015   HGB 14.1 07/09/2015   HCT 42.3 07/09/2015   MCV 92.4 07/09/2015   PLT 224 07/09/2015   Lab Results  Component Value Date   NA 134* 07/09/2015   K 3.7 07/09/2015   CL 103 07/09/2015   CO2 24 07/09/2015   Lab Results  Component Value Date   ALT 15* 07/09/2015   AST 21 07/09/2015   ALKPHOS 76 07/09/2015   BILITOT 0.9 07/09/2015      RADIOGRAPHY: X-ray Chest Pa And Lateral  07/09/2015  CLINICAL DATA:  Known right-sided lung carcinoma EXAM: CHEST  2 VIEW COMPARISON:  07/03/2015 FINDINGS: Cardiac shadow is within normal limits. The known central and right-sided mass lesion is not well appreciated due to overlying mediastinal shadows. Postsurgical changes are seen. A defibrillator is again noted. No focal infiltrate or sizable effusion is seen. IMPRESSION: No new acute abnormality is noted. The known mass  centrally on the right is not well appreciated due to the tortuous aorta. Electronically Signed   By: Elta Guadeloupe  Lukens M.D.   On: 07/09/2015 21:57   Nm Pet Image Initial (pi) Skull Base To Thigh  07/03/2015  CLINICAL DATA:  Initial treatment strategy for RIGHT lung mass. EXAM: NUCLEAR MEDICINE PET SKULL BASE TO THIGH TECHNIQUE: 8.4 mCi F-18 FDG was injected intravenously. Full-ring PET imaging was performed from the skull base to thigh after the radiotracer. CT data was obtained and used for attenuation correction and anatomic localization. FASTING BLOOD GLUCOSE:  Value: 95 mg/dl COMPARISON:  None available. FINDINGS: NECK No hypermetabolic lymph nodes in the neck. CHEST Large hypermetabolic mass surrounds the bronchus intermedius with intense metabolic activity. Mass measures 3.9 by 3.5 cm at the level of the bronchus intermedius (image 66, series 4) with intense metabolic activity (insert SUV max 30.6). Mass extends superiorly and posterior to the carina with contiguous infiltrative pattern (image 59, series 4). Mass measures approximately 9 cm in craniocaudad dimension the from mid tracheal level to the distal aspect of the bronchus intermedius. There is no hypermetabolic supraclavicular nodes. There is no hypermetabolic pulmonary nodules. There is a mild reticular pattern in the LEFT lung suggesting chronic interstitial lung disease. Esophagus appears normal. The mass lesion within posterior mediastinum contacts the esophagus at the level of the carina with mild leftward displacement. ABDOMEN/PELVIS No abnormal hypermetabolic activity within the liver, pancreas, adrenal glands, or spleen. No hypermetabolic lymph nodes in the abdomen or pelvis. Sigmoid diverticulosis. Prostate hypertrophy. Atherosclerotic calcification aorta SKELETON No focal hypermetabolic activity to suggest skeletal metastasis. IMPRESSION: 1. Large intensely hypermetabolic mass surrounding the bronchus intermedius and infiltrating the posterior  mediastinum most consistent with bronchogenic carcinoma. Consider small cell carcinoma. 2. Mass lesion contacts the wall of the esophagus without clear evidence of invasion. 3. No discrete hypermetabolic pulmonary nodules. These results will be called to the ordering clinician or representative by the Radiologist Assistant, and communication documented in the PACS or zVision Dashboard. Electronically Signed   By: Suzy Bouchard M.D.   On: 07/03/2015 15:29       IMPRESSION: Stage III, T4, N0M0 NSCLC, squamous cell carcinoma of the right lung    PLAN: Dr. Lisbeth Renshaw has reviewed his films and we are in the process of obtaining his pathology records from the [name] in [place]. I have been in communication with the medical oncologist he met last week, Dr. Jimmye Norman. He was considering chemotherapy and taxol/carboplatin in this patient. Due to logistics of the patient's care and location of family who can help, he would like to receive his treatments in [place]. We discussed the options for 6 1/2 weeks of external radiotherapy to the right chest with consideration of concurrent chemotherapy. Dr. Alen Blew will see him and review his thoughts on this. We discussed radiotherapy including the risks, benefits, short, and long term effects of treatment. He is interested in proceeding and will come to our department for simulation at 3pm today.  For the patient's cough, I will check his prn meds to see if we can add any medication to help his symptoms. We will also contact his cardiologist to ensure that we can proceed with his ICD present.  In a visit lasting 60 minutes, greater than 50% of the time was spend explaining Dr. Jimmye Norman' recommendations and the coordination of his care now that he will be treated in [place].   Carola Rhine, PAC

## 2015-07-10 NOTE — Consult Note (Signed)
Reason for Referral: Lung cancer.   HPI: 82 year old gentleman currently of [place] where he had been living for an extended period of time. There is a gentleman with history of coronary artery disease, cardiomyopathy and remote smoking history. He started developing symptoms of shortness of breath and mild hemoptysis in the last year and certainly gotten worse in the last 3 months. He gets his routine care at the [facility] and his workup included a CT scan of the chest revealed lung mass and subsequently a PET CT scan showed 3.9 x 3.5 cm mass at the level of the bronchus intermedius with hypermetabolic activity at SUV of 30. The mass extends into the carina without any other nodules. He underwent a bronchoscopy and biopsy done on 07/09/2015 at the [facility] which showed a high-grade occlusion of the right mainstem bronchus with diffuse infiltration of the distal trachea and the main carina. The biopsy obtained at the time confirmed the presence of squamous cell carcinoma. He was transferred to was a long hospital urgently to start radiation. Clinically he does report dyspnea on exertion and cough. He does report some mild hemoptysis. He also reported some weight loss. Does not report any chest pain or abdominal pain.   Denies any headaches or blurry vision. He denied any peripheral neuropathy or alteration of mental status. He does not report any fevers, chills, sweats and appetite is improving. Does not report any palpitation, orthopnea or leg edema. Does not report any wheezing. Does not report any nausea, vomiting, abdominal pain, hematochezia or melena. He does not report any frequency urgency or hesitancy. As that report any skeletal complaints of arthralgias or myalgias. Remaining review of systems unremarkable.   Past Medical History  Diagnosis Date  . Hypertension   . Hyperlipidemia   . Mitral regurgitation     Moderate by 08/20/11 echo  . Shortness of breath   . CAD  (coronary artery disease)     a) 08/22/11 cath :  50-60% left main stenosis, mild-moderate LAD disease, 99% ostial large diagonal stenosis, occluded and collateralized LCx and RCA  . Aortic stenosis     Severe by 08/20/11 echo  . Aortic insufficiency     Moderate by 08/20/11 echo  . Chronic systolic CHF (congestive heart failure) ([place])     a) 08/20/11 echo : LVEF 35-40%  :  Past Surgical History  Procedure Laterality Date  . Eye surgery    . Cardiac catheterization  08/22/11     50-60% left main stenosis, mild-moderate LAD disease, 99% ostial large diagonal stenosis, occluded and collateralized LCx and RCA  . Coronary artery bypass graft  08/25/2011    Procedure: CORONARY ARTERY BYPASS GRAFTING (CABG);  Surgeon: Grace Isaac, MD;  Location: [place];  Service: Open Heart Surgery;  Laterality: N/A;  Hot and humid all through surgery.  . Aortic valve replacement  08/25/2011    Procedure: AORTIC VALVE REPLACEMENT (AVR);  Surgeon: Grace Isaac, MD;  Location: [place];  Service: Open Heart Surgery;  Laterality: N/A;  hot and humid all through surgery.  . Intra-aortic balloon pump insertion N/A 08/24/2011    Procedure: INTRA-AORTIC BALLOON PUMP INSERTION;  Surgeon: Peter M Martinique, MD;  Location: [facility] CATH LAB;  Service: Cardiovascular;  Laterality: N/A;  . Implantable cardioverter defibrillator implant N/A 09/06/2011    Procedure: IMPLANTABLE CARDIOVERTER DEFIBRILLATOR IMPLANT;  Surgeon: Evans Lance, MD;  Location: [facility] CATH LAB;  Service: Cardiovascular;  Laterality: N/A;  :   Current facility-administered  medications:  .  0.45 % sodium chloride infusion, , Intravenous, Continuous, Roney Jaffe, MD, Last Rate: 50 mL/hr at 07/10/15 1333, 1,000 mL at 07/10/15 1333 .  acetaminophen (TYLENOL) tablet 500-1,000 mg, 500-1,000 mg, Oral, Q6H PRN, Roney Jaffe, MD .  albuterol (PROVENTIL) (2.5 MG/3ML) 0.083% nebulizer solution 2.5 mg, 2.5 mg, Nebulization, Q4H PRN, Roney Jaffe, MD .  amiodarone  (PACERONE) tablet 200 mg, 200 mg, Oral, Daily, Roney Jaffe, MD, 200 mg at 07/10/15 1000 .  aspirin EC tablet 81 mg, 81 mg, Oral, Daily, Roney Jaffe, MD, 81 mg at 07/09/15 2134 .  docusate sodium (COLACE) capsule 100 mg, 100 mg, Oral, BID, Roney Jaffe, MD, 100 mg at 07/10/15 0959 .  enoxaparin (LOVENOX) injection 40 mg, 40 mg, Subcutaneous, Q24H, Roney Jaffe, MD, 40 mg at 07/09/15 2134 .  guaiFENesin-codeine 100-10 MG/5ML solution 5-10 mL, 5-10 mL, Oral, Q4H PRN, Roney Jaffe, MD .  HYDROcodone-acetaminophen (NORCO/VICODIN) 5-325 MG per tablet 1-2 tablet, 1-2 tablet, Oral, Q4H PRN, Roney Jaffe, MD .  ipratropium-albuterol (DUONEB) 0.5-2.5 (3) MG/3ML nebulizer solution 3 mL, 3 mL, Nebulization, QID, Roney Jaffe, MD, 3 mL at 07/10/15 1314 .  losartan (COZAAR) tablet 100 mg, 100 mg, Oral, Daily, Roney Jaffe, MD, 100 mg at 07/10/15 0959 .  menthol-cetylpyridinium (CEPACOL) lozenge 3 mg, 1 lozenge, Oral, PRN, Roney Jaffe, MD, 3 mg at 07/09/15 2110 .  metoprolol tartrate (LOPRESSOR) tablet 25 mg, 25 mg, Oral, BID, Roney Jaffe, MD, 25 mg at 07/10/15 1000 .  pantoprazole (PROTONIX) EC tablet 40 mg, 40 mg, Oral, Daily, Roney Jaffe, MD, 40 mg at 07/10/15 0959 .  polyethylene glycol (MIRALAX / GLYCOLAX) packet 17 g, 17 g, Oral, QODAY, Roney Jaffe, MD, 17 g at 07/10/15 0959 .  simvastatin (ZOCOR) tablet 5 mg, 5 mg, Oral, QPM, Roney Jaffe, MD, 5 mg at 07/09/15 2136 .  sodium phosphate (FLEET) 7-19 GM/118ML enema 1 enema, 1 enema, Rectal, Once PRN, Roney Jaffe, MD:  Allergies  Allergen Reactions  . Nifedipine Other (See Comments)    edema  . Fish Allergy Nausea And Vomiting  . Lisinopril     cough  . Penicillins Hives    Has patient had a PCN reaction causing immediate rash, facial/tongue/throat swelling, SOB or lightheadedness with hypotension: Yes Has patient had a PCN reaction causing severe rash involving mucus membranes or skin necrosis: Yes Has patient had a  PCN reaction that required hospitalization Unknown Has patient had a PCN reaction occurring within the last 10 years: No If all of the above answers are "NO", then may proceed with Cephalosporin use.   . Statins     Leg cramps  . Sulfa Antibiotics     Hives   :  Family History  Problem Relation Age of Onset  . Heart disease Brother   :  Social History   Social History  . Marital Status: Single    Spouse Name: N/A  . Number of Children: N/A  . Years of Education: N/A   Occupational History  . Not on file.   Social History Main Topics  . Smoking status: Former Smoker    Quit date: 01/25/1995  . Smokeless tobacco: Not on file  . Alcohol Use: No  . Drug Use: No  . Sexual Activity: Not on file   Other Topics Concern  . Not on file   Social History Narrative  :  Pertinent items are noted in HPI.  Exam: Blood pressure 99/53, pulse 75, temperature 98.4 F (36.9 C), temperature source Oral,  resp. rate 14, SpO2 97 %. General appearance: alert and cooperative hard of hearing. Head: Normocephalic, without obvious abnormality Throat: lips, mucosa, and tongue normal; teeth and gums normal Neck: no adenopathy Back: negative Resp: Expiratory wheezes noted bilaterally. No dullness to percussion. Chest wall: no tenderness Cardio: regular rate and rhythm, S1, S2 normal, no murmur, click, rub or gallop GI: soft, non-tender; bowel sounds normal; no masses,  no organomegaly Extremities: extremities normal, atraumatic, no cyanosis or edema Pulses: 2+ and symmetric Skin: Skin color, texture, turgor normal. No rashes or lesions Lymph nodes: Cervical, supraclavicular, and axillary nodes normal. Neurologic: Grossly normal   Recent Labs  07/09/15 2132  WBC 11.2*  HGB 14.1  HCT 42.3  PLT 224    Recent Labs  07/09/15 2132  NA 134*  K 3.7  CL 103  CO2 24  GLUCOSE 123*  BUN 21*  CREATININE 1.01  CALCIUM 9.2      X-ray Chest Pa And Lateral  07/09/2015  CLINICAL  DATA:  Known right-sided lung carcinoma EXAM: CHEST  2 VIEW COMPARISON:  07/03/2015 FINDINGS: Cardiac shadow is within normal limits. The known central and right-sided mass lesion is not well appreciated due to overlying mediastinal shadows. Postsurgical changes are seen. A defibrillator is again noted. No focal infiltrate or sizable effusion is seen. IMPRESSION: No new acute abnormality is noted. The known mass centrally on the right is not well appreciated due to the tortuous aorta. Electronically Signed   By: [name] M.D.   On: 07/09/2015 21:57   Nm Pet Image Initial (pi) Skull Base To Thigh  07/03/2015  CLINICAL DATA:  Initial treatment strategy for RIGHT lung mass. EXAM: NUCLEAR MEDICINE PET SKULL BASE TO THIGH TECHNIQUE: 8.4 mCi F-18 FDG was injected intravenously. Full-ring PET imaging was performed from the skull base to thigh after the radiotracer. CT data was obtained and used for attenuation correction and anatomic localization. FASTING BLOOD GLUCOSE:  Value: 95 mg/dl COMPARISON:  None available. FINDINGS: NECK No hypermetabolic lymph nodes in the neck. CHEST Large hypermetabolic mass surrounds the bronchus intermedius with intense metabolic activity. Mass measures 3.9 by 3.5 cm at the level of the bronchus intermedius (image 66, series 4) with intense metabolic activity (insert SUV max 30.6). Mass extends superiorly and posterior to the carina with contiguous infiltrative pattern (image 59, series 4). Mass measures approximately 9 cm in craniocaudad dimension the from mid tracheal level to the distal aspect of the bronchus intermedius. There is no hypermetabolic supraclavicular nodes. There is no hypermetabolic pulmonary nodules. There is a mild reticular pattern in the LEFT lung suggesting chronic interstitial lung disease. Esophagus appears normal. The mass lesion within posterior mediastinum contacts the esophagus at the level of the carina with mild leftward displacement. ABDOMEN/PELVIS No  abnormal hypermetabolic activity within the liver, pancreas, adrenal glands, or spleen. No hypermetabolic lymph nodes in the abdomen or pelvis. Sigmoid diverticulosis. Prostate hypertrophy. Atherosclerotic calcification aorta SKELETON No focal hypermetabolic activity to suggest skeletal metastasis. IMPRESSION: 1. Large intensely hypermetabolic mass surrounding the bronchus intermedius and infiltrating the posterior mediastinum most consistent with bronchogenic carcinoma. Consider small cell carcinoma. 2. Mass lesion contacts the wall of the esophagus without clear evidence of invasion. 3. No discrete hypermetabolic pulmonary nodules. These results will be called to the ordering clinician or representative by the Radiologist Assistant, and communication documented in the PACS or zVision Dashboard. Electronically Signed   By: Suzy Bouchard M.D.   On: 07/03/2015 15:29    Assessment and Plan:  82 year old gentleman with the following issues:  1. Squamous cell carcinoma of the right lung presented with a large mass measuring 3.9 x 3.5 cm with invasion into the bronchus intermedius and infiltrating into the mediastinum. No evidence of lymphadenopathy noted on the PET/CT scan or distant metastasis. He is status post a bronchoscopy obtained on 07/09/2015 and the preliminary report support the finding of a squamous cell carcinoma. Clinical staging indicated IIIA disease.  The natural course of this disease was discussed with the patient and adjuvant radiation therapy is highly recommended and he is being simulated in the near future to start radiation on Monday, 07/13/2015. The role of concomitant chemotherapy for a curative intent was discussed today. He is a marginal candidate for combination of carboplatin and Taxol concomitantly with radiation. Complications associated with this regimen were reviewed today include nausea, vomiting, myelosuppression, neutropenia, neutropenic sepsis and possible late for  intravenous antibiotics. Peripheral neuropathy, renal insufficiency and infusion related complications are also has been noted.  After discussion today, he is willing to try this regimen and attempt to accomplish long-term disease control and possible cure. He does that discontinuation of this therapy might be needed if he has poor tolerance. Chemotherapy will be and do not started weekly for a total of 6 weeks if is tolerated properly. His laboratory data from 07/09/2015 were reviewed today and appears adequate to start chemotherapy next week.  I plan on starting the first cycle of chemotherapy on 07/17/2015 after chemotherapy education class.  2. IV access: His peripheral veins will be used but Port-A-Cath might be needed if his access becomes an issue.  3. Antiemetics: Prescription for antiemetics will be available to the patient before the start of chemotherapy.  4. Respiratory distress: Multifactorial in nature related to his primary tumor and underlying lung disease. Radiation therapy should help in improving aeration of his lung. He is receiving oxygen therapy as well as bronchodilators.  Follow-up will be arranged for the patient upon discharge from the hospital to start chemotherapy in the next week.

## 2015-07-10 NOTE — Telephone Encounter (Signed)
vm full....mailed pt appt sched and letter °

## 2015-07-10 NOTE — Telephone Encounter (Signed)
Called [employer], [place] rep, he will come to the hospital t [facility] long room 1332 and check pateint's device  Today and gave info when tohave ICD cjehecked again per MD Cardiology office in [place] gave verbal understanding, thanked Aaron Edelman,  For  His help 2:01 PM

## 2015-07-11 DIAGNOSIS — Z951 Presence of aortocoronary bypass graft: Secondary | ICD-10-CM

## 2015-07-11 DIAGNOSIS — I10 Essential (primary) hypertension: Secondary | ICD-10-CM

## 2015-07-11 LAB — BASIC METABOLIC PANEL
ANION GAP: 6 (ref 5–15)
BUN: 18 mg/dL (ref 6–20)
CALCIUM: 8.5 mg/dL — AB (ref 8.9–10.3)
CO2: 25 mmol/L (ref 22–32)
CREATININE: 0.98 mg/dL (ref 0.61–1.24)
Chloride: 104 mmol/L (ref 101–111)
GFR calc Af Amer: >60 mL/min (ref >60)
GLUCOSE: 94 mg/dL (ref 65–99)
Potassium: 3.8 mmol/L (ref 3.5–5.1)
Sodium: 135 mmol/L (ref 135–145)

## 2015-07-11 LAB — CBC
HCT: 36.5 % — ABNORMAL LOW (ref 39.0–52.0)
Hemoglobin: 12.1 g/dL — ABNORMAL LOW (ref 13.0–17.0)
MCH: 30.9 pg (ref 26.0–34.0)
MCHC: 33.2 g/dL (ref 30.0–36.0)
MCV: 93.4 fL (ref 78.0–100.0)
PLATELETS: 202 10*3/uL (ref 150–400)
RBC: 3.91 MIL/uL — ABNORMAL LOW (ref 4.22–5.81)
RDW: 14.8 % (ref 11.5–15.5)
WBC: 6.4 10*3/uL (ref 4.0–10.5)

## 2015-07-11 MED ORDER — ENSURE ENLIVE PO LIQD
237.0000 mL | Freq: Two times a day (BID) | ORAL | Status: DC
  Administered 2015-07-12 – 2015-07-13 (×3): 237 mL via ORAL

## 2015-07-11 MED ORDER — POLYVINYL ALCOHOL 1.4 % OP SOLN
2.0000 [drp] | OPHTHALMIC | Status: DC | PRN
  Administered 2015-07-11: 2 [drp] via OPHTHALMIC
  Filled 2015-07-11: qty 15

## 2015-07-11 MED ORDER — HYDROCODONE-ACETAMINOPHEN 5-325 MG PO TABS
1.0000 | ORAL_TABLET | Freq: Once | ORAL | Status: AC
  Administered 2015-07-11: 1 via ORAL
  Filled 2015-07-11: qty 1

## 2015-07-11 NOTE — Progress Notes (Signed)
[facility]  PROGRESS NOTE  Gabriel Marsh GUR:[id number] DOB: 1930/12/07 DOA: 07/09/2015 PCP: [name], MD    Brief HPI:  82 y.o. male with hx of CABG / AVR biovalve, afib, HTN, CM w EF 25%/ AICD, sent for admission from [name] in [place] due to near occlusion of R mainstem bronchus due to infiltrating tumor/ cancer.   Patient and niece provide history, she has medical POA. Pt has had cough about 1 year, bloody cough about 2-3 mos. Dx'd 3 wks ago w lung mass and today underwent bronchoscopy.   Bronch report from today > high grade occlusion at origin of R mainstem bronchus w diffuse tumor infiltrating the distal trachea and main carina. Origin of R mainstem bronchus was occluded to about 5 mm diameter. Path is pending. Pt transferred for radiation.   Principal Problem:   Lung cancer [facility]) Active Problems:   Hypertension   Atrial fibrillation (HCC)   S/P aortic valve replacement with bioprosthetic valve   Bronchus, R mainstem stenosis   S/P CABG x 3,  2831   Chronic systolic heart failure ([place])   DNR (do not resuscitate)   Cough   Mainstem bronchial stenosis   Assessment/Plan: 1. Lung cancer, right mainstem bronchus near occlusion- patient underwent drawn yesterday at the [facility], and transferred for radiation treatment. Continue DuoNeb nebulizers 4 times a day. Patient to start XRT on Monday and chemotherapy on 03/19/2015 2. Cough- likely from above. Start codeine cough syrup when necessary. 3. Hypertension-blood pressure on the low side, but hold losartan and continue metoprolol 25 mg twice a day,  4. History of CABG/aortic valve replacement bioprosthetic/AICD - stable    DVT prophylaxis: Lovenox Code Status: DO NOT RESUSCITATE Family Communication: No family at bedside Disposition Plan: Pending evaluation by oncology and radiation oncology   Consultants:  None  Procedures:  None  Antibiotics:  None  Subjective: Patient seen and examined. Patient  to start XRT on Monday  Objective: Filed Vitals:   07/10/15 2115 07/11/15 0458 07/11/15 0855 07/11/15 1012  BP: 116/65 99/59  113/62  Pulse: 63 65  83  Temp: 97.4 F (36.3 C) 98.2 F (36.8 C)    TempSrc: Axillary Oral    Resp:      Height:  '5\' 10"'$  (1.778 m)    Weight:  71.7 kg (158 lb 1.1 oz)    SpO2: 100% 97% 97%     Intake/Output Summary (Last 24 hours) at 07/11/15 1057 Last data filed at 07/11/15 0530  Gross per 24 hour  Intake 1795.83 ml  Output    200 ml  Net 1595.83 ml   Filed Weights   07/11/15 0458  Weight: 71.7 kg (158 lb 1.1 oz)    Examination:  General exam: Appears calm and comfortable  Respiratory system: Bilateral rhonchi Cardiovascular system: S1 & S2 heard, RRR. No JVD, murmurs, rubs, gallops or clicks. No pedal edema. Gastrointestinal system: Abdomen is nondistended, soft and nontender. No organomegaly or masses felt. Normal bowel sounds heard. Central nervous system: Alert and oriented. No focal neurological deficits. Extremities: Symmetric 5 x 5 power. Skin: No rashes, lesions or ulcers Psychiatry: Judgement and insight appear normal. Mood & affect appropriate.    Data Reviewed: I have personally reviewed following labs and imaging studies Basic Metabolic Panel:  Recent Labs Lab 07/09/15 2132 07/11/15 0351  NA 134* 135  K 3.7 3.8  CL 103 104  CO2 24 25  GLUCOSE 123* 94  BUN 21* 18  CREATININE 1.01 0.98  CALCIUM 9.2  8.5*   Liver Function Tests:  Recent Labs Lab 07/09/15 2132  AST 21  ALT 15*  ALKPHOS 76  BILITOT 0.9  PROT 6.9  ALBUMIN 3.7    CBC:  Recent Labs Lab 07/09/15 2132 07/11/15 0351  WBC 11.2* 6.4  NEUTROABS 8.4*  --   HGB 14.1 12.1*  HCT 42.3 36.5*  MCV 92.4 93.4  PLT 224 202     Studies: X-ray Chest Pa And Lateral  07/09/2015  CLINICAL DATA:  Known right-sided lung carcinoma EXAM: CHEST  2 VIEW COMPARISON:  07/03/2015 FINDINGS: Cardiac shadow is within normal limits. The known central and right-sided  mass lesion is not well appreciated due to overlying mediastinal shadows. Postsurgical changes are seen. A defibrillator is again noted. No focal infiltrate or sizable effusion is seen. IMPRESSION: No new acute abnormality is noted. The known mass centrally on the right is not well appreciated due to the tortuous aorta. Electronically Signed   By: [name] M.D.   On: 07/09/2015 21:57    Scheduled Meds: . amiodarone  200 mg Oral Daily  . aspirin EC  81 mg Oral Daily  . docusate sodium  100 mg Oral BID  . enoxaparin (LOVENOX) injection  40 mg Subcutaneous Q24H  . ipratropium-albuterol  3 mL Nebulization QID  . metoprolol tartrate  25 mg Oral BID  . pantoprazole  40 mg Oral Daily  . polyethylene glycol  17 g Oral QODAY  . simvastatin  5 mg Oral QPM   Continuous Infusions: . sodium chloride 50 mL/hr at 07/11/15 0530       Time spent: 25 min    [facility] Pager [phone]. If 7PM-7AM, please contact night-coverage at [email or web address], Office  [phone]  password [email or web address] 07/11/2015, 10:57 AM  LOS: 2 days

## 2015-07-11 NOTE — Evaluation (Signed)
Physical Therapy Evaluation Patient Details Name: Gabriel Marsh MRN: [id number] DOB: [birth year] Today's Date: 07/11/2015   History of Present Illness  Pt admitted with near occlusion R mainstem bronchus due to inflitrating tumor/CA.Marland Kitchen  Pt with hx of CABG  Clinical Impression  Pt admitted as above and presenting with functional mobility limitations 2* generalized weakness, SOB with exertion and mild ambulatory balance deficits.  Pt plans dc home with niece and would benefit from follow up HHPT to further address deficits.    Follow Up Recommendations Home health PT    Equipment Recommendations  None recommended by PT    Recommendations for Other Services       Precautions / Restrictions Precautions Precautions: Fall Restrictions Weight Bearing Restrictions: No      Mobility  Bed Mobility Overal bed mobility: Needs Assistance Bed Mobility: Supine to Sit     Supine to sit: Min assist     General bed mobility comments: min assist to bring trunk to upright  Transfers Overall transfer level: Needs assistance Equipment used: Rolling walker (2 wheeled) Transfers: Sit to/from Stand Sit to Stand: Min assist         General transfer comment: cues for use of UEs to self assist  Ambulation/Gait Ambulation/Gait assistance: Min assist Ambulation Distance (Feet): 220 Feet Assistive device: None Gait Pattern/deviations: Step-through pattern;Decreased step length - right;Decreased step length - left;Shuffle;Trunk flexed;Wide base of support Gait velocity: Decreased with multiple standing rests to complete task   General Gait Details: Multiple standing rests to complete task with mild instability noted but no loss of balance.  Distance ltd 2* pt c/o LE weakness and SOB - Pt maintained SaO2 at 94% or higher except for one brief desat to 88% on O2 @ 2L  Stairs            Wheelchair Mobility    Modified Rankin (Stroke Patients Only)       Balance Overall balance  assessment: Needs assistance Sitting-balance support: Feet supported;No upper extremity supported Sitting balance-Leahy Scale: Good     Standing balance support: No upper extremity supported Standing balance-Leahy Scale: Fair                               Pertinent Vitals/Pain Pain Assessment: No/denies pain    Home Living Family/patient expects to be discharged to:: Private residence Living Arrangements: Alone Available Help at Discharge: Family;Available 24 hours/day Type of Home: House Home Access: Stairs to enter Entrance Stairs-Rails: Right Entrance Stairs-Number of Steps: 3 Home Layout: Two level Home Equipment: Walker - 2 wheels;Cane - single point;Bedside commode Additional Comments: Pt plans to stay with niece after dc    Prior Function Level of Independence: Independent;Independent with assistive device(s)         Comments: used cane for ambulation out of house     Hand Dominance        Extremity/Trunk Assessment   Upper Extremity Assessment: Generalized weakness           Lower Extremity Assessment: Generalized weakness      Cervical / Trunk Assessment: Kyphotic  Communication   Communication: No difficulties;HOH  Cognition Arousal/Alertness: Awake/alert Behavior During Therapy: WFL for tasks assessed/performed Overall Cognitive Status: Within Functional Limits for tasks assessed                      General Comments      Exercises        Assessment/Plan  PT Assessment Patient needs continued PT services  PT Diagnosis Difficulty walking   PT Problem List Decreased strength;Decreased range of motion;Decreased activity tolerance;Decreased balance;Decreased mobility  PT Treatment Interventions DME instruction;Gait training;Stair training;Functional mobility training;Therapeutic activities;Therapeutic exercise;Patient/family education;Balance training   PT Goals (Current goals can be found in the Care Plan section)  Acute Rehab PT Goals Patient Stated Goal: Regain IND  PT Goal Formulation: With patient Time For Goal Achievement: 07/25/15 Potential to Achieve Goals: Good    Frequency Min 3X/week   Barriers to discharge        Co-evaluation               End of Session Equipment Utilized During Treatment: Gait belt;Oxygen Activity Tolerance: Patient limited by fatigue Patient left: in chair;with call bell/phone within reach;with chair alarm set;with family/visitor present Nurse Communication: Mobility status         Time: 1446-1510 PT Time Calculation (min) (ACUTE ONLY): 24 min   Charges:   PT Evaluation $PT Eval Low Complexity: 1 Procedure PT Treatments $Gait Training: 8-22 mins   PT G Codes:        Gabriel Marsh 08-10-2015, 5:12 PM

## 2015-07-12 MED ORDER — GUAIFENESIN ER 600 MG PO TB12
600.0000 mg | ORAL_TABLET | Freq: Two times a day (BID) | ORAL | Status: DC
  Administered 2015-07-12 – 2015-07-13 (×3): 600 mg via ORAL
  Filled 2015-07-12 (×3): qty 1

## 2015-07-12 NOTE — Progress Notes (Signed)
[facility]  PROGRESS NOTE  Gabriel Marsh KGU:[id number] DOB: 01-17-1931 DOA: 07/09/2015 PCP: [name], MD    Brief HPI:  82 y.o. male with hx of CABG / AVR biovalve, afib, HTN, CM w EF 25%/ AICD, sent for admission from [name] in [place] due to near occlusion of R mainstem bronchus due to infiltrating tumor/ cancer.   Patient and niece provide history, she has medical POA. Pt has had cough about 1 year, bloody cough about 2-3 mos. Dx'd 3 wks ago w lung mass and today underwent bronchoscopy.   Bronch report from today > high grade occlusion at origin of R mainstem bronchus w diffuse tumor infiltrating the distal trachea and main carina. Origin of R mainstem bronchus was occluded to about 5 mm diameter. Path is pending. Pt transferred for radiation.   Principal Problem:   Lung cancer [facility]) Active Problems:   Hypertension   Atrial fibrillation (HCC)   S/P aortic valve replacement with bioprosthetic valve   Bronchus, R mainstem stenosis   S/P CABG x 3,  6283   Chronic systolic heart failure ([place])   DNR (do not resuscitate)   Cough   Mainstem bronchial stenosis   Assessment/Plan: 1. Lung cancer, right mainstem bronchus near occlusion- patient underwent drawn yesterday at the [facility], and transferred for radiation treatment. Continue DuoNeb nebulizers 4 times a day. Patient to start XRT on Monday and chemotherapy on 03/19/2015 2. Cough- likely from above. Start codeine cough syrup when necessary. Will add Mucinex 600 mg po BID 3. Hypertension-blood pressure on the low side, but hold losartan and continue metoprolol 25 mg twice a day,  4. History of CABG/aortic valve replacement bioprosthetic/AICD - stable    DVT prophylaxis: Lovenox Code Status: DO NOT RESUSCITATE Family Communication: No family at bedside Disposition Plan: Pending evaluation by oncology and radiation  oncology   Consultants:  None  Procedures:  None  Antibiotics:  None  Subjective: Patient seen and examined. Patient to start XRT on Monday. Coughing up phlegm.   Objective: Filed Vitals:   07/12/15 0617 07/12/15 0745 07/12/15 1027 07/12/15 1152  BP: 131/72  107/65   Pulse: 63  76   Temp: 98 F (36.7 C)     TempSrc: Oral     Resp: 17     Height:      Weight:      SpO2: 99% 98%  98%    Intake/Output Summary (Last 24 hours) at 07/12/15 1318 Last data filed at 07/12/15 0100  Gross per 24 hour  Intake   1540 ml  Output    200 ml  Net   1340 ml   Filed Weights   07/11/15 0458  Weight: 71.7 kg (158 lb 1.1 oz)    Examination:  General exam: Appears calm and comfortable  Respiratory system: Bilateral rhonchi Cardiovascular system: S1 & S2 heard, RRR. No JVD, murmurs, rubs, gallops or clicks. No pedal edema. Gastrointestinal system: Abdomen is nondistended, soft and nontender. No organomegaly or masses felt. Normal bowel sounds heard. Central nervous system: Alert and oriented. No focal neurological deficits. Extremities: Symmetric 5 x 5 power. Skin: No rashes, lesions or ulcers Psychiatry: Judgement and insight appear normal. Mood & affect appropriate.    Data Reviewed: I have personally reviewed following labs and imaging studies Basic Metabolic Panel:  Recent Labs Lab 07/09/15 2132 07/11/15 0351  NA 134* 135  K 3.7 3.8  CL 103 104  CO2 24 25  GLUCOSE 123* 94  BUN 21* 18  CREATININE 1.01  0.98  CALCIUM 9.2 8.5*   Liver Function Tests:  Recent Labs Lab 07/09/15 2132  AST 21  ALT 15*  ALKPHOS 76  BILITOT 0.9  PROT 6.9  ALBUMIN 3.7    CBC:  Recent Labs Lab 07/09/15 2132 07/11/15 0351  WBC 11.2* 6.4  NEUTROABS 8.4*  --   HGB 14.1 12.1*  HCT 42.3 36.5*  MCV 92.4 93.4  PLT 224 202     Studies: No results found.  Scheduled Meds: . amiodarone  200 mg Oral Daily  . aspirin EC  81 mg Oral Daily  . docusate sodium  100 mg Oral BID   . enoxaparin (LOVENOX) injection  40 mg Subcutaneous Q24H  . feeding supplement (ENSURE ENLIVE)  237 mL Oral BID BM  . guaiFENesin  600 mg Oral BID  . ipratropium-albuterol  3 mL Nebulization QID  . metoprolol tartrate  25 mg Oral BID  . pantoprazole  40 mg Oral Daily  . polyethylene glycol  17 g Oral QODAY  . simvastatin  5 mg Oral QPM   Continuous Infusions: . sodium chloride 50 mL/hr at 07/12/15 0317       Time spent: 25 min    [facility] Pager [phone]. If 7PM-7AM, please contact night-coverage at [email or web address], Office  [phone]  password [email or web address] 07/12/2015, 1:18 PM  LOS: 3 days

## 2015-07-12 NOTE — Clinical Social Work Note (Signed)
CSW received referral for SNF.  CSW informed case manager, that PT is recommending home health.  CSW to sign off please re-consult if social work needs arise.  Jones Broom. [place], MSW, [place]

## 2015-07-13 ENCOUNTER — Encounter: Payer: Self-pay | Admitting: *Deleted

## 2015-07-13 ENCOUNTER — Ambulatory Visit
Admit: 2015-07-13 | Discharge: 2015-07-13 | Disposition: A | Discharge status: Home / Self Care | Payer: No Typology Code available for payment source | Attending: Radiation Oncology | Admitting: Radiation Oncology

## 2015-07-13 ENCOUNTER — Telehealth: Payer: Self-pay | Admitting: *Deleted

## 2015-07-13 ENCOUNTER — Other Ambulatory Visit: Payer: Self-pay | Admitting: *Deleted

## 2015-07-13 DIAGNOSIS — I480 Paroxysmal atrial fibrillation: Secondary | ICD-10-CM

## 2015-07-13 MED ORDER — HYDROCODONE-ACETAMINOPHEN 5-325 MG PO TABS
1.0000 | ORAL_TABLET | ORAL | Status: DC | PRN
Start: 2015-07-13 — End: 2015-07-30

## 2015-07-13 MED ORDER — POLYETHYLENE GLYCOL 3350 17 G PO PACK: 17.0000 g | PACK | ORAL | Status: AC

## 2015-07-13 MED ORDER — IPRATROPIUM-ALBUTEROL 0.5-2.5 (3) MG/3ML IN SOLN: 3.0000 mL | Freq: Four times a day (QID) | RESPIRATORY_TRACT | Status: AC

## 2015-07-13 MED ORDER — DOCUSATE SODIUM 100 MG PO CAPS: 100.0000 mg | ORAL_CAPSULE | Freq: Two times a day (BID) | ORAL | Status: AC

## 2015-07-13 MED ORDER — GUAIFENESIN-CODEINE 100-10 MG/5ML PO SOLN: 5.0000 mL | ORAL | Status: DC | PRN

## 2015-07-13 MED ORDER — SALINE SPRAY 0.65 % NA SOLN
1.0000 | NASAL | Status: DC | PRN
  Filled 2015-07-13: qty 44

## 2015-07-13 MED ORDER — PROCHLORPERAZINE MALEATE 10 MG PO TABS: 10.0000 mg | ORAL_TABLET | Freq: Four times a day (QID) | ORAL | Status: DC | PRN

## 2015-07-13 MED FILL — HYDROCODON-APAP 5-325: 5-325 | 5 days supply | Qty: 30 | Fill #0

## 2015-07-13 MED FILL — IPRAT-ALBUT 0.5-3(2.5) MG/3: 0.5-2.5 (3) | 30 days supply | Qty: 360 | Fill #0

## 2015-07-13 MED FILL — GUAIATUSSIN AC LIQUID: 100-10 | 2 days supply | Qty: 120 | Fill #0

## 2015-07-13 NOTE — Progress Notes (Signed)
Spoke with patient at bedside. States he will be staying with his niece, [name] at d/c ([place], [place], [place]) and she will make decision about HH. Spoke with [place] after she arrived, she chose [place] for [facility] services. Contacted Tim with [facility] for referral. Patient qualifies for home O2 at d/c, this is new, contacted [facility] to arrange this and provide a travel tank for the patient. Qualifying sats documented. [name] has lots of questions about medications and getting them filled at [facility]. Discussed with her to find a fax number and we can send directly to [facility] for fill. Discussed with attending who will review med concerns and changes prior to d/c.

## 2015-07-13 NOTE — Progress Notes (Signed)
IP PROGRESS NOTE  Subjective:   Gabriel Marsh reports no new complaints. He was able to ambulate to the bathroom and shower without any difficulties. He is dyspneic with this exertion. He is not reporting any cough or hemoptysis.  Objective:  Vital signs in last 24 hours: Temp:  [97.5 F (36.4 C)-98.6 F (37 C)] 97.5 F (36.4 C) (06/19 0553) Pulse Rate:  [75-77] 75 (06/19 0553) Resp:  [16-22] 16 (06/19 0553) BP: (107-126)/(64-71) 126/71 mmHg (06/19 0553) SpO2:  [95 %-100 %] 99 % (06/19 0553) Weight change:  Last BM Date: 07/11/15  Intake/Output from previous day: 06/18 0701 - 06/19 0700 In: 1440 [P.O.:840; I.V.:600] Out: 1500 [Urine:1500] Alert, awake gentleman with mild dyspnea. Mouth: mucous membranes moist, pharynx normal without lesions Resp: clear to auscultation bilaterally expiratory wheezes noted. Cardio: regular rate and rhythm, S1, S2 normal, no murmur, click, rub or gallop GI: soft, non-tender; bowel sounds normal; no masses,  no organomegaly Extremities: extremities normal, atraumatic, no cyanosis or edema   Lab Results:  Recent Labs  07/11/15 0351  WBC 6.4  HGB 12.1*  HCT 36.5*  PLT 202    BMET  Recent Labs  07/11/15 0351  NA 135  K 3.8  CL 104  CO2 25  GLUCOSE 94  BUN 18  CREATININE 0.98  CALCIUM 8.5*    Medications: I have reviewed the patient's current medications.  Assessment/Plan:  82 year old gentleman with the following issues:  1.Squamous cell carcinoma of the right lung presented with a large mass measuring 3.9 x 3.5 cm with invasion into the bronchus intermedius and infiltrating into the mediastinum. No evidence of lymphadenopathy noted on the PET/CT scan or distant metastasis. He is status post a bronchoscopy obtained on 07/09/2015 and the preliminary report support the finding of a squamous cell carcinoma. Clinical staging indicated IIIA disease.  He is to start definitive radiation therapy today and the first cycle of weekly  chemotherapy scheduled for 07/17/2015. Risks and benefits of carboplatin and Taxol chemotherapy were reviewed again. Carboplatin will be given at AUC of 2 and Taxol will be given 45 mg/m. These complications would include peripheral neuropathy, myelosuppression as well as GI toxicity. He is agreeable to proceed and chemotherapy education class is set up for 07/16/2015. I anticipate he will need 6 weeks of therapy at most.  2. IV access: Risks and benefits of Port-A-Cath insertion was reviewed today and he declined. He prefers to use peripheral veins which is reasonable at this time. Port-A-Cath insertion might be needed if his peripheral veins are inadequate to finish so course of chemotherapy.   3. Follow-up: He is scheduled to have a follow-up at the [facility] on the day of chemotherapy administration on 07/17/2015.   LOS: 4 days   [facility] 07/13/2015, 10:32 AM

## 2015-07-13 NOTE — Discharge Summary (Signed)
Physician Discharge Summary  Gabriel Marsh HAL:[id number] DOB: September 16, 1930 DOA: 07/09/2015  PCP: Gabriel Chestnut, MD  Admit date: 07/09/2015 Discharge date: 07/13/2015  Time spent: *35 minutes  Recommendations for Outpatient Follow-up:  1. Follow up Radiation oncology as per their instructions   Discharge Diagnoses:  Principal Problem:   Lung cancer [name]) Active Problems:   Hypertension   Atrial fibrillation ([name])   S/P aortic valve replacement with bioprosthetic valve   Bronchus, R mainstem stenosis   S/P CABG x 3,  7353   Chronic systolic heart failure ([name])   DNR (do not resuscitate)   Cough   Mainstem bronchial stenosis   Discharge Condition: Stable  Diet recommendation: Regular diet  Filed Weights   07/11/15 0458  Weight: 71.7 kg (158 lb 1.1 oz)    History of present illness:  82 y.o. male with hx of CABG / AVR biovalve, afib, HTN, CM w EF 25%/ AICD, sent for admission from [name] in [name] due to near occlusion of R mainstem bronchus due to infiltrating tumor/ cancer.   Patient and niece provide history, she has medical POA. Pt has had cough about 1 year, bloody cough about 2-3 mos. Dx'd 3 wks ago w lung mass and today underwent bronchoscopy.   Bronch report from today > high grade occlusion at origin of R mainstem bronchus w diffuse tumor infiltrating the distal trachea and main carina. Origin of R mainstem bronchus was occluded to about 5 mm diameter. Path is pending. Pt transferred for radiation.   Hospital Course:  1. Lung cancer, right mainstem bronchus near occlusion- patient underwent bronchoscopy  at the [name], and transferred for radiation treatment. Continue DuoNeb nebulizers 4 times a day. Patient underwent t XRT today and will complete 33 radiation treatments as outpt.  and start chemotherapy on 03/19/2015 2. Cough- likely from above. Start codeine- guanefensin cough syrup when necessary.  3. Hypertension- continue losartan and metoprolol 25 mg twice  a day,  4. History of CABG/aortic valve replacement bioprosthetic/AICD - stable  Procedures:  None   Consultations:  Radiation oncology   Oncology   Discharge Exam: Filed Vitals:   07/13/15 0553 07/13/15 1052  BP: 126/71 164/96  Pulse: 75 95  Temp: 97.5 F (36.4 C)   Resp: 16     General: appears in no acute distress Cardiovascular: s1s2 rrr Respiratory: clear bilaterally  Discharge Instructions   Discharge Instructions    Diet - low sodium heart healthy    Complete by:  As directed      Increase activity slowly    Complete by:  As directed           Current Discharge Medication List    START taking these medications   Details  docusate sodium (COLACE) 100 MG capsule Take 1 capsule (100 mg total) by mouth 2 (two) times daily. Qty: 10 capsule, Refills: 0    guaiFENesin-codeine 100-10 MG/5ML syrup Take 5-10 mLs by mouth every 4 (four) hours as needed for cough. Qty: 120 mL, Refills: 0    HYDROcodone-acetaminophen (NORCO/VICODIN) 5-325 MG tablet Take 1 tablet by mouth every 4 (four) hours as needed for moderate pain. Qty: 30 tablet, Refills: 0    ipratropium-albuterol (DUONEB) 0.5-2.5 (3) MG/3ML SOLN Take 3 mLs by nebulization 4 (four) times daily. Qty: 360 mL, Refills: 2    polyethylene glycol (MIRALAX / GLYCOLAX) packet Take 17 g by mouth every other day. Qty: 14 each, Refills: 2      CONTINUE these medications which have NOT CHANGED  Details  acetaminophen (TYLENOL) 500 MG tablet Take 500-1,000 mg by mouth every 6 (six) hours as needed for moderate pain.    amiodarone (PACERONE) 200 MG tablet Take 1 tablet (200 mg total) by mouth daily. Qty: 30 tablet, Refills: 11    aspirin EC 81 MG tablet Take 81 mg by mouth daily.    hydrochlorothiazide (HYDRODIURIL) 25 MG tablet Take 25 mg by mouth daily.    losartan (COZAAR) 100 MG tablet Take 100 mg by mouth daily.    metoprolol tartrate (LOPRESSOR) 25 MG tablet Take 1 tablet (25 mg total) by mouth 2  (two) times daily. Qty: 60 tablet, Refills: 1    omeprazole (PRILOSEC) 20 MG capsule Take 20 mg by mouth daily.    simvastatin (ZOCOR) 10 MG tablet Take 5 mg by mouth every evening.       Allergies  Allergen Reactions  . Nifedipine Other (See Comments)    edema  . Fish Allergy Nausea And Vomiting  . Lisinopril     cough  . Penicillins Hives    Has patient had a PCN reaction causing immediate rash, facial/tongue/throat swelling, SOB or lightheadedness with hypotension: Yes Has patient had a PCN reaction causing severe rash involving mucus membranes or skin necrosis: Yes Has patient had a PCN reaction that required hospitalization Unknown Has patient had a PCN reaction occurring within the last 10 years: No If all of the above answers are "NO", then may proceed with Cephalosporin use.   . Statins     Leg cramps  . Sulfa Antibiotics     Hives    Follow-up Information    Follow up with [name].   Why:  physical therapy   Contact information:   [name] [address] [phone]       Follow up with [name].   Why:  oxygen   Contact information:   [name] [address] [phone]        The results of significant diagnostics from this hospitalization (including imaging, microbiology, ancillary and laboratory) are listed below for reference.    Significant Diagnostic Studies: X-ray Chest Pa And Lateral  07/09/2015  CLINICAL DATA:  Known right-sided lung carcinoma EXAM: CHEST  2 VIEW COMPARISON:  07/03/2015 FINDINGS: Cardiac shadow is within normal limits. The known central and right-sided mass lesion is not well appreciated due to overlying mediastinal shadows. Postsurgical changes are seen. A defibrillator is again noted. No focal infiltrate or sizable effusion is seen. IMPRESSION: No new acute abnormality is noted. The known mass centrally on the right is not well appreciated due to the  tortuous aorta. Electronically Signed   By: [name] M.D.   On: 07/09/2015 21:57   Nm Pet Image Initial (pi) Skull Base To Thigh  07/03/2015  CLINICAL DATA:  Initial treatment strategy for RIGHT lung mass. EXAM: NUCLEAR MEDICINE PET SKULL BASE TO THIGH TECHNIQUE: 8.4 mCi F-18 FDG was injected intravenously. Full-ring PET imaging was performed from the skull base to thigh after the radiotracer. CT data was obtained and used for attenuation correction and anatomic localization. FASTING BLOOD GLUCOSE:  Value: 95 mg/dl COMPARISON:  None available. FINDINGS: NECK No hypermetabolic lymph nodes in the neck. CHEST Large hypermetabolic mass surrounds the bronchus intermedius with intense metabolic activity. Mass measures 3.9 by 3.5 cm at the level of the bronchus intermedius (image 66, series 4) with intense metabolic activity (insert SUV max 30.6). Mass extends superiorly  and posterior to the carina with contiguous infiltrative pattern (image 59, series 4). Mass measures approximately 9 cm in craniocaudad dimension the from mid tracheal level to the distal aspect of the bronchus intermedius. There is no hypermetabolic supraclavicular nodes. There is no hypermetabolic pulmonary nodules. There is a mild reticular pattern in the LEFT lung suggesting chronic interstitial lung disease. Esophagus appears normal. The mass lesion within posterior mediastinum contacts the esophagus at the level of the carina with mild leftward displacement. ABDOMEN/PELVIS No abnormal hypermetabolic activity within the liver, pancreas, adrenal glands, or spleen. No hypermetabolic lymph nodes in the abdomen or pelvis. Sigmoid diverticulosis. Prostate hypertrophy. Atherosclerotic calcification aorta SKELETON No focal hypermetabolic activity to suggest skeletal metastasis. IMPRESSION: 1. Large intensely hypermetabolic mass surrounding the bronchus intermedius and infiltrating the posterior mediastinum most consistent with bronchogenic carcinoma.  Consider small cell carcinoma. 2. Mass lesion contacts the wall of the esophagus without clear evidence of invasion. 3. No discrete hypermetabolic pulmonary nodules. These results will be called to the ordering clinician or representative by the Radiologist Assistant, and communication documented in the PACS or zVision Dashboard. Electronically Signed   By: Suzy Bouchard M.D.   On: 07/03/2015 15:29    Microbiology: No results found for this or any previous visit (from the past 240 hour(s)).   Labs: Basic Metabolic Panel:  Recent Labs Lab 07/09/15 2132 07/11/15 0351  NA 134* 135  K 3.7 3.8  CL 103 104  CO2 24 25  GLUCOSE 123* 94  BUN 21* 18  CREATININE 1.01 0.98  CALCIUM 9.2 8.5*   Liver Function Tests:  Recent Labs Lab 07/09/15 2132  AST 21  ALT 15*  ALKPHOS 76  BILITOT 0.9  PROT 6.9  ALBUMIN 3.7   No results for input(s): LIPASE, AMYLASE in the last 168 hours. No results for input(s): AMMONIA in the last 168 hours. CBC:  Recent Labs Lab 07/09/15 2132 07/11/15 0351  WBC 11.2* 6.4  NEUTROABS 8.4*  --   HGB 14.1 12.1*  HCT 42.3 36.5*  MCV 92.4 93.4  PLT 224 202     CBG: No results for input(s): GLUCAP in the last 168 hours.     SignedOswald Hillock MD.  [facility] 07/13/2015, 1:41 PM

## 2015-07-13 NOTE — Telephone Encounter (Signed)
[id number] Jud'es Rep, spoke with Windle Guard, [phone], he did check patient's ICD on Friday 07/10/15, and  Stated"every thing checked out okay , he is good to go and I'll check him again last day of rad tx" "15 pages in patient's chart and he will get a monitor when he goes home ," thanked Ortley ,notified   Ciara RT therapist on [place].knac #2 8:40 AM

## 2015-07-13 NOTE — Progress Notes (Signed)
SATURATION QUALIFICATIONS: (This note is used to comply with regulatory documentation for home oxygen)  Patient Saturations on Room Air at Rest = 92%  Patient Saturations on Room Air while Ambulating = 86%  Patient Saturations on 2 Liters of oxygen while Ambulating = 96%  Please briefly explain why patient needs home oxygen: lung CA, desats and dyspnea with exertion

## 2015-07-14 ENCOUNTER — Telehealth: Payer: Self-pay | Admitting: *Deleted

## 2015-07-14 ENCOUNTER — Other Ambulatory Visit: Payer: Self-pay | Admitting: *Deleted

## 2015-07-14 ENCOUNTER — Ambulatory Visit
Admit: 2015-07-14 | Discharge: 2015-07-14 | Disposition: A | Discharge status: Home / Self Care | Payer: No Typology Code available for payment source | Attending: Radiation Oncology | Admitting: Radiation Oncology

## 2015-07-14 DIAGNOSIS — Z510 Encounter for antineoplastic radiation therapy: Secondary | ICD-10-CM | POA: Diagnosis not present

## 2015-07-14 MED ORDER — PROCHLORPERAZINE MALEATE 10 MG PO TABS: 10.0000 mg | ORAL_TABLET | Freq: Four times a day (QID) | ORAL | Status: DC | PRN

## 2015-07-14 MED FILL — PROCHLORPERAZINE 10 MG TAB: 10 | 7 days supply | Qty: 30 | Fill #0

## 2015-07-14 NOTE — Telephone Encounter (Signed)
Called niece [name], and after speaking with her, she stated patient was nauseated after radition and last night, ,saw that compazine was sent to [facility] , but niece lives here in [place] to send rx to [facility] put patient pharmacy, his tx time is 1245pm today, pt chemo education6/22/17 at 1000am, and lab,D,r. Shadad and  Chemo infusion is on Friday, and after rxd txs, will print new schedule for the niece, and will call her back after speaking with Dr. Jasmine December nurse to transfer rx compazine, . Called and spoke with Dixie, RN ,she will call in rx compazine to [facility] out pt pharmacy. Called niece  [name] and is aware of status change of compazine  9:10 AM

## 2015-07-14 NOTE — Telephone Encounter (Signed)
Patient is staying with his sister here in [place], transferred compazine script to [facility]. Canc[place] [facility] [place]

## 2015-07-14 NOTE — Telephone Encounter (Signed)
Received vm message from pt's niece Polly Cobia @ 3:37 pm. TC back to her. She is inquiring about pt's schedule for his chemotherapy this week. He was discharged from the hospital yesterday and mildred is unsure of his schedule. Reviewed pt's schedule with her, including chemo education class on Thursday. She voiced understanding.  She is also asking when he will get his port inserted.  Informed [name] that a port insertion appt is not on his schedule at this time. [name] states that initially patient did not want a port but changed his mind while in the hospital. Informed her that I would pass this message on to Dr. Alen Blew. [name] is asking if it could be done prior to his chemo treatment on this coming Friday, 07/17/15. As there are only 2 days between now and his treatment day, I advised that it may not happen in time but his treatment could be done through peripheral IV and then done in time for next treatment.

## 2015-07-15 ENCOUNTER — Ambulatory Visit
Admission: RE | Admit: 2015-07-15 | Discharge: 2015-07-15 | Disposition: A | Discharge status: Home / Self Care | Payer: Medicare Other | Source: Ambulatory Visit | Attending: Radiation Oncology | Admitting: Radiation Oncology

## 2015-07-15 ENCOUNTER — Ambulatory Visit
Admit: 2015-07-15 | Discharge: 2015-07-15 | Disposition: A | Discharge status: Home / Self Care | Payer: No Typology Code available for payment source | Attending: Radiation Oncology | Admitting: Radiation Oncology

## 2015-07-15 ENCOUNTER — Other Ambulatory Visit: Payer: Self-pay | Admitting: Oncology

## 2015-07-15 ENCOUNTER — Encounter: Payer: Self-pay | Admitting: Radiation Oncology

## 2015-07-15 DIAGNOSIS — Z510 Encounter for antineoplastic radiation therapy: Secondary | ICD-10-CM | POA: Diagnosis not present

## 2015-07-15 DIAGNOSIS — C3431 Malignant neoplasm of lower lobe, right bronchus or lung: Secondary | ICD-10-CM

## 2015-07-15 DIAGNOSIS — C3401 Malignant neoplasm of right main bronchus: Secondary | ICD-10-CM

## 2015-07-15 MED ORDER — SONAFINE EX EMUL
1.0000 "application " | Freq: Two times a day (BID) | CUTANEOUS | Status: DC
  Administered 2015-07-15: 1 via TOPICAL

## 2015-07-15 NOTE — Progress Notes (Signed)
Patient education done, radiation therapy and you book, my business card, sonafine cream given to patient and niece mildred, discussed with both ways to manage side effects, fatigue, skin iritation, throat changes, difficulty swallowing, pain, weight loss, may need IVF'S, may need carafate  Medication for the throat  For swallowing difficulties , may need to eat 5-6 smaller meals with snacks(ensure) between meals, increase protein in diet, stay hydrated, drink plenty fluids,  Loss chest hair only, uses sonafine cream daily after rad txs, nothing 4 hours prior to treatments, verbal understanding, teach back method, will see MD/Staff  RN weekly  And prn  4:01 PM

## 2015-07-16 ENCOUNTER — Ambulatory Visit
Admission: RE | Admit: 2015-07-16 | Discharge: 2015-07-16 | Disposition: A | Discharge status: Home / Self Care | Payer: No Typology Code available for payment source | Source: Ambulatory Visit | Attending: Radiation Oncology | Admitting: Radiation Oncology

## 2015-07-16 ENCOUNTER — Encounter: Payer: Self-pay | Admitting: *Deleted

## 2015-07-16 ENCOUNTER — Other Ambulatory Visit: Payer: Self-pay | Admitting: *Deleted

## 2015-07-16 ENCOUNTER — Other Ambulatory Visit: Payer: Medicare Other

## 2015-07-16 DIAGNOSIS — Z510 Encounter for antineoplastic radiation therapy: Secondary | ICD-10-CM | POA: Diagnosis not present

## 2015-07-16 MED ORDER — LIDOCAINE-PRILOCAINE 2.5-2.5 % EX CREA: 1.0000 "application " | TOPICAL_CREAM | CUTANEOUS | Status: AC | PRN

## 2015-07-16 MED FILL — LIDOCAINE-PRILOCAINE CREAM: 2.5-2.5 | 10 days supply | Qty: 30 | Fill #0

## 2015-07-17 ENCOUNTER — Encounter: Payer: Self-pay | Admitting: Radiation Oncology

## 2015-07-17 ENCOUNTER — Telehealth: Payer: Self-pay | Admitting: *Deleted

## 2015-07-17 ENCOUNTER — Ambulatory Visit (HOSPITAL_BASED_OUTPATIENT_CLINIC_OR_DEPARTMENT_OTHER): Payer: Medicare Other

## 2015-07-17 ENCOUNTER — Other Ambulatory Visit (HOSPITAL_BASED_OUTPATIENT_CLINIC_OR_DEPARTMENT_OTHER): Payer: Medicare Other

## 2015-07-17 ENCOUNTER — Telehealth: Payer: Self-pay | Admitting: Oncology

## 2015-07-17 ENCOUNTER — Ambulatory Visit (HOSPITAL_BASED_OUTPATIENT_CLINIC_OR_DEPARTMENT_OTHER): Payer: Medicare Other | Admitting: Oncology

## 2015-07-17 ENCOUNTER — Ambulatory Visit
Admit: 2015-07-17 | Discharge: 2015-07-17 | Disposition: A | Discharge status: Home / Self Care | Payer: Medicare Other | Attending: Radiation Oncology | Admitting: Radiation Oncology

## 2015-07-17 ENCOUNTER — Ambulatory Visit
Admission: RE | Admit: 2015-07-17 | Discharge: 2015-07-17 | Disposition: A | Discharge status: Home / Self Care | Payer: No Typology Code available for payment source | Source: Ambulatory Visit | Attending: Radiation Oncology | Admitting: Radiation Oncology

## 2015-07-17 VITALS — BP 152/75 | HR 78 | Temp 97.6°F | Resp 20

## 2015-07-17 VITALS — BP 130/77 | HR 71 | Temp 98.2°F | Resp 18 | Ht 70.0 in | Wt 164.9 lb

## 2015-07-17 VITALS — BP 136/78 | HR 74 | Temp 98.1°F | Resp 18

## 2015-07-17 DIAGNOSIS — C3431 Malignant neoplasm of lower lobe, right bronchus or lung: Secondary | ICD-10-CM

## 2015-07-17 DIAGNOSIS — C3401 Malignant neoplasm of right main bronchus: Secondary | ICD-10-CM

## 2015-07-17 DIAGNOSIS — Z510 Encounter for antineoplastic radiation therapy: Secondary | ICD-10-CM | POA: Diagnosis not present

## 2015-07-17 DIAGNOSIS — Z5111 Encounter for antineoplastic chemotherapy: Secondary | ICD-10-CM | POA: Diagnosis present

## 2015-07-17 LAB — COMPREHENSIVE METABOLIC PANEL
ALBUMIN: 3.3 g/dL — AB (ref 3.5–5.0)
ALK PHOS: 78 U/L (ref 40–150)
ALT: 25 U/L (ref 0–55)
ANION GAP: 9 meq/L (ref 3–11)
AST: 23 U/L (ref 5–34)
BUN: 14.3 mg/dL (ref 7.0–26.0)
CALCIUM: 9.9 mg/dL (ref 8.4–10.4)
CO2: 28 mEq/L (ref 22–29)
Chloride: 101 mEq/L (ref 98–109)
Creatinine: 0.8 mg/dL (ref 0.7–1.3)
EGFR: 81 mL/min/{1.73_m2} — AB (ref >90)
Glucose: 90 mg/dl (ref 70–140)
POTASSIUM: 4.4 meq/L (ref 3.5–5.1)
Sodium: 138 mEq/L (ref 136–145)
Total Bilirubin: 0.58 mg/dL (ref 0.20–1.20)
Total Protein: 7.4 g/dL (ref 6.4–8.3)

## 2015-07-17 LAB — CBC WITH DIFFERENTIAL/PLATELET
BASO%: 0.5 % (ref 0.0–2.0)
BASOS ABS: 0 10*3/uL (ref 0.0–0.1)
EOS ABS: 0.2 10*3/uL (ref 0.0–0.5)
EOS%: 2.3 % (ref 0.0–7.0)
HEMATOCRIT: 42 % (ref 38.4–49.9)
HEMOGLOBIN: 13.8 g/dL (ref 13.0–17.1)
LYMPH#: 0.7 10*3/uL — AB (ref 0.9–3.3)
LYMPH%: 9 % — ABNORMAL LOW (ref 14.0–49.0)
MCH: 30.9 pg (ref 27.2–33.4)
MCHC: 33 g/dL (ref 32.0–36.0)
MCV: 93.7 fL (ref 79.3–98.0)
MONO#: 0.8 10*3/uL (ref 0.1–0.9)
MONO%: 11.4 % (ref 0.0–14.0)
NEUT#: 5.7 10*3/uL (ref 1.5–6.5)
NEUT%: 76.8 % — ABNORMAL HIGH (ref 39.0–75.0)
PLATELETS: 275 10*3/uL (ref 140–400)
RBC: 4.48 10*6/uL (ref 4.20–5.82)
RDW: 15 % — AB (ref 11.0–14.6)
WBC: 7.4 10*3/uL (ref 4.0–10.3)

## 2015-07-17 MED ORDER — SODIUM CHLORIDE 0.9 % IV SOLN
20.0000 mg | Freq: Once | INTRAVENOUS | Status: AC
  Administered 2015-07-17: 20 mg via INTRAVENOUS
  Filled 2015-07-17: qty 2

## 2015-07-17 MED ORDER — SODIUM CHLORIDE 0.9 % IV SOLN
45.0000 mg/m2 | Freq: Once | INTRAVENOUS | Status: AC
  Administered 2015-07-17: 90 mg via INTRAVENOUS
  Filled 2015-07-17: qty 15

## 2015-07-17 MED ORDER — FAMOTIDINE IN NACL 20-0.9 MG/50ML-% IV SOLN
INTRAVENOUS | Status: AC
  Filled 2015-07-17: qty 50

## 2015-07-17 MED ORDER — PALONOSETRON HCL INJECTION 0.25 MG/5ML
0.2500 mg | Freq: Once | INTRAVENOUS | Status: AC
  Administered 2015-07-17: 0.25 mg via INTRAVENOUS

## 2015-07-17 MED ORDER — DIPHENHYDRAMINE HCL 50 MG/ML IJ SOLN
INTRAMUSCULAR | Status: AC
  Filled 2015-07-17: qty 1

## 2015-07-17 MED ORDER — SODIUM CHLORIDE 0.9 % IV SOLN
180.0000 mg | Freq: Once | INTRAVENOUS | Status: AC
  Administered 2015-07-17: 180 mg via INTRAVENOUS
  Filled 2015-07-17: qty 18

## 2015-07-17 MED ORDER — PALONOSETRON HCL INJECTION 0.25 MG/5ML
INTRAVENOUS | Status: AC
  Filled 2015-07-17: qty 5

## 2015-07-17 MED ORDER — FAMOTIDINE IN NACL 20-0.9 MG/50ML-% IV SOLN
20.0000 mg | Freq: Once | INTRAVENOUS | Status: AC
  Administered 2015-07-17: 20 mg via INTRAVENOUS

## 2015-07-17 MED ORDER — DIPHENHYDRAMINE HCL 50 MG/ML IJ SOLN
50.0000 mg | Freq: Once | INTRAMUSCULAR | Status: AC
  Administered 2015-07-17: 50 mg via INTRAVENOUS

## 2015-07-17 MED ORDER — SODIUM CHLORIDE 0.9 % IV SOLN
Freq: Once | INTRAVENOUS | Status: AC
  Administered 2015-07-17: 09:00:00 via INTRAVENOUS

## 2015-07-17 NOTE — Telephone Encounter (Signed)
Gave adn pritned appt sched for June and July ...the patient aware MD visit on 7.14 @ 11:30am

## 2015-07-17 NOTE — Patient Instructions (Signed)
[facility] Discharge Instructions for Patients Receiving Chemotherapy  Today you received the following chemotherapy agents: Taxol and Carboplatin.  To help prevent nausea and vomiting after your treatment, we encourage you to take your nausea medication: Compazine. Take one every 6 hours as needed. If you develop nausea and vomiting that is not controlled by your nausea medication, call the clinic.   BELOW ARE SYMPTOMS THAT SHOULD BE REPORTED IMMEDIATELY:  *FEVER GREATER THAN 100.5 F  *CHILLS WITH OR WITHOUT FEVER  NAUSEA AND VOMITING THAT IS NOT CONTROLLED WITH YOUR NAUSEA MEDICATION  *UNUSUAL SHORTNESS OF BREATH  *UNUSUAL BRUISING OR BLEEDING  TENDERNESS IN MOUTH AND THROAT WITH OR WITHOUT PRESENCE OF ULCERS  *URINARY PROBLEMS  *BOWEL PROBLEMS  UNUSUAL RASH Items with * indicate a potential emergency and should be followed up as soon as possible.  Feel free to call the clinic should you have any questions or concerns. The clinic phone number is [phone].  Please show the CHEMO ALERT CARD at check-in to the Emergency Department and triage nurse.   

## 2015-07-17 NOTE — Progress Notes (Addendum)
Weekly rad txs right lung, wears 2 liters n/c oxygen 96%, slight audiv=ble wheezing heard, says coughs up whitish/yellowish saliva, appetite   Slight poor, is improving, some food feels like it gets stuck,   ctakes menthol cough drops tongue is bluish purple, moist,   Had chemotherapy today,  Weak and fatigued,  Has severe  pain in shoulder from having them up for radiation, resolves  After a while  Has sonafine cream, not using as yet,skin  No changes 1:54 PM BP 152/75 mmHg  Pulse 78  Temp(Src) 97.6 F (36.4 C) (Oral)  Resp 20  Wt   SpO2 96%  Wt Readings from Last 3 Encounters:  07/17/15 164 lb 14.4 oz (74.798 kg)  07/11/15 158 lb 1.1 oz (71.7 kg)  12/29/11 174 lb (78.926 kg)

## 2015-07-17 NOTE — Progress Notes (Signed)
Late entry for 1300: Pt tolerated first Taxol/Carbo treatment well aside from Benadryl side effects. Pt had slurred speech, drowsiness and reported feeling "loopy." Will request Benadryl dose reduction for next cycle.

## 2015-07-17 NOTE — Progress Notes (Signed)
  Radiation Oncology         [phone]   Name: Gabriel Marsh MRN: [id number]   Date: 07/17/2015  DOB: Feb 09, 1930   Weekly Radiation Therapy Management    ICD-9-CM ICD-10-CM   1. Malignant neoplasm of lower lobe of right lung (HCC) 162.5 C34.31     Current Dose: 10 Gy  Planned Dose:  66 Gy  Narrative The patient presents for routine under treatment assessment.  Weekly rad txs right lung. He says he has a productive cough with white/yellow sputum. He has a poor appetite and food feels like it gets stuck when he swallows. He is weak and fatigued. He has severe should pain from having them up for radiation that resolves after a while. Has sonafine cream, not using as of yet. No skin changes noted by the nurse.  Set-up films were reviewed. The chart was checked.  Physical Findings  oral temperature is 97.6 F (36.4 C). His blood pressure is 152/75 and his pulse is 78. His respiration is 20 and oxygen saturation is 96%. . Weight essentially stable.  No significant changes. Pt on 2 L of oxygen via nasal cannula. The patient presents to the clinic in a wheelchair. The patient is somewhat hard of hearing.  Impression The patient is tolerating radiation.  Plan Continue treatment as planned.     Gabriel Marsh, M.D.  This document serves as a record of services personally performed by Gabriel Pita, MD. It was created on his behalf by Gabriel Marsh, a trained medical scribe. The creation of this record is based on the scribe's personal observations and the provider's statements to them. This document has been checked and approved by the attending provider.

## 2015-07-17 NOTE — Progress Notes (Signed)
Hematology and Oncology Follow Up Visit  Gabriel Marsh [id number] 11/14/1930 82 y.o. 07/17/2015 8:28 AM Gabriel Marsh, Gabriel Marsh, Renu, MD   Principle Diagnosis: 82 year old gentleman with squamous cell carcinoma of the right lung presented with a mass measuring 3.9 x 3.5 cm and the clinical staging is stage IIIA disease. This was diagnosed in June 2017.   Prior Therapy: Status post bronchoscopy done on 07/09/2015 confirmed the presence of squamous cell carcinoma.  Current therapy: Definitive radiation therapy concomitantly with carboplatin and Taxol given on a weekly basis. Week one is starting on 07/17/2015.  Interim History: Gabriel Marsh presents today for a follow-up visit. He is a gentleman presents for a follow-up visit after he was seen as a consult in the hospital. Since his discharge, he is feeling fairly well and bleeding under reasonable control. He is still requiring 2 L of oxygen and does not report any dyspnea at rest. He does report dyspnea on exertion. Does not report any cough or hemoptysis. He received 4 treatments of radiation without any complications and ready to proceed with a first cycle of chemotherapy today.  Denies any headaches or blurry vision. He denied any peripheral neuropathy or alteration of mental status. He does not report any fevers, chills, sweats and appetite is improving. Does not report any palpitation, orthopnea or leg edema. Does not report any wheezing. Does not report any nausea, vomiting, abdominal pain, hematochezia or melena. He does not report any frequency urgency or hesitancy. As that report any skeletal complaints of arthralgias or myalgias. Remaining review of systems unremarkable.   Medications: I have reviewed the patient's current medications.  Current Outpatient Prescriptions  Medication Sig Dispense Refill  . acetaminophen (TYLENOL) 500 MG tablet Take 500-1,000 mg by mouth every 6 (six) hours as needed for moderate pain.    Marland Kitchen amiodarone (PACERONE)  200 MG tablet Take 1 tablet (200 mg total) by mouth daily. 30 tablet 11  . aspirin EC 81 MG tablet Take 81 mg by mouth daily.    Marland Kitchen docusate sodium (COLACE) 100 MG capsule Take 1 capsule (100 mg total) by mouth 2 (two) times daily. 10 capsule 0  . guaiFENesin-codeine 100-10 MG/5ML syrup Take 5-10 mLs by mouth every 4 (four) hours as needed for cough. 120 mL 0  . hydrochlorothiazide (HYDRODIURIL) 25 MG tablet Take 25 mg by mouth daily.    Marland Kitchen HYDROcodone-acetaminophen (NORCO/VICODIN) 5-325 MG tablet Take 1 tablet by mouth every 4 (four) hours as needed for moderate pain. 30 tablet 0  . ipratropium-albuterol (DUONEB) 0.5-2.5 (3) MG/3ML SOLN Take 3 mLs by nebulization 4 (four) times daily. 360 mL 2  . lidocaine-prilocaine (EMLA) cream Apply 1 application topically as needed. Apply 1/2 tsp to skin over port 1 hour  prior to chemotherapy treatment. Do not rub in. Cover with plastic only 30 g 1  . losartan (COZAAR) 100 MG tablet Take 100 mg by mouth daily.    . metoprolol tartrate (LOPRESSOR) 25 MG tablet Take 1 tablet (25 mg total) by mouth 2 (two) times daily. 60 tablet 1  . omeprazole (PRILOSEC) 20 MG capsule Take 20 mg by mouth daily.    . polyethylene glycol (MIRALAX / GLYCOLAX) packet Take 17 g by mouth every other day. 14 each 2  . prochlorperazine (COMPAZINE) 10 MG tablet Take 1 tablet (10 mg total) by mouth every 6 (six) hours as needed for nausea or vomiting. 30 tablet 0  . simvastatin (ZOCOR) 10 MG tablet Take 5 mg by mouth every evening.  No current facility-administered medications for this visit.     Allergies:  Allergies  Allergen Reactions  . Nifedipine Other (See Comments)    edema  . Fish Allergy Nausea And Vomiting  . Lisinopril     cough  . Penicillins Hives    Has patient had a PCN reaction causing immediate rash, facial/tongue/throat swelling, SOB or lightheadedness with hypotension: Yes Has patient had a PCN reaction causing severe rash involving mucus membranes or skin  necrosis: Yes Has patient had a PCN reaction that required hospitalization Unknown Has patient had a PCN reaction occurring within the last 10 years: No If all of the above answers are "NO", then may proceed with Cephalosporin use.   . Statins     Leg cramps  . Sulfa Antibiotics     Hives     Past Medical History, Surgical history, Social history, and Family History were reviewed and updated.   Physical Exam: Blood pressure 130/77, pulse 71, temperature 98.2 F (36.8 C), temperature source Oral, resp. rate 18, height '5\' 10"'$  (1.778 m), weight 164 lb 14.4 oz (74.798 kg), SpO2 99 %.   ECOG:  2 General appearance: alert and cooperative Head: Normocephalic, without obvious abnormality Neck: no adenopathy Lymph nodes: Cervical, supraclavicular, and axillary nodes normal. Heart:regular rate and rhythm, S1, S2 normal, no murmur, click, rub or gallop Lung:chest clear, no wheezing, rales, normal symmetric air entry.Expiratory wheezes noted bilaterally. Abdomin: soft, non-tender, without masses or organomegaly EXT:no erythema, induration, or nodules   Lab Results: Lab Results  Component Value Date   WBC 7.4 07/17/2015   HGB 13.8 07/17/2015   HCT 42.0 07/17/2015   MCV 93.7 07/17/2015   PLT 275 07/17/2015     Chemistry      Component Value Date/Time   NA 135 07/11/2015 0351   K 3.8 07/11/2015 0351   CL 104 07/11/2015 0351   CO2 25 07/11/2015 0351   BUN 18 07/11/2015 0351   CREATININE 0.98 07/11/2015 0351      Component Value Date/Time   CALCIUM 8.5* 07/11/2015 0351   ALKPHOS 76 07/09/2015 2132   AST 21 07/09/2015 2132   ALT 15* 07/09/2015 2132   BILITOT 0.9 07/09/2015 2132       Impression and Plan:   82 year old gentleman with the following issues:  1. Squamous cell carcinoma of the right lung presented with a large mass measuring 3.9 x 3.5 cm with invasion into the bronchus intermedius and infiltrating into the mediastinum. No evidence of lymphadenopathy noted on  the PET/CT scan or distant metastasis. He is status post a bronchoscopy obtained on 07/09/2015 and the preliminary report support the finding of a squamous cell carcinoma. Clinical staging indicated IIIA disease.  He is receiving definitive radiation therapy and will receive his first week of carboplatin and Taxol today. Complications from this therapy were reviewed again including nausea, vomiting, myelosuppression and esophagitis. He is ready to proceed with week 1 today.  2. IV access: He agreed to a Port-A-Cath insertion which will be scheduled next week.  3. Antiemetics: Compazine is available to the patient.  4. Respiratory distress: Seems to be improving with definitive therapy for his lung cancer. He continues to be on oxygen therapy.  5. Pain: Limited mostly to his shoulder because of positioning. He is using hydrocodone for pain.  6. Follow-up: Will be on a weekly basis for his chemotherapy.  [facility], MD 6/23/20178:28 AM

## 2015-07-17 NOTE — Telephone Encounter (Signed)
eror

## 2015-07-20 ENCOUNTER — Ambulatory Visit
Admission: RE | Admit: 2015-07-20 | Discharge: 2015-07-20 | Disposition: A | Discharge status: Home / Self Care | Payer: No Typology Code available for payment source | Source: Ambulatory Visit | Attending: Radiation Oncology | Admitting: Radiation Oncology

## 2015-07-20 ENCOUNTER — Other Ambulatory Visit: Payer: Self-pay | Admitting: *Deleted

## 2015-07-20 DIAGNOSIS — C3490 Malignant neoplasm of unspecified part of unspecified bronchus or lung: Secondary | ICD-10-CM

## 2015-07-20 DIAGNOSIS — Z510 Encounter for antineoplastic radiation therapy: Secondary | ICD-10-CM | POA: Diagnosis not present

## 2015-07-20 MED ORDER — GUAIFENESIN-CODEINE 100-10 MG/5ML PO SOLN: 5.0000 mL | ORAL | Status: AC | PRN

## 2015-07-20 MED FILL — GUAIATUSSIN AC LIQUID: 100-10 | 4 days supply | Qty: 240 | Fill #0

## 2015-07-21 ENCOUNTER — Ambulatory Visit
Admission: RE | Admit: 2015-07-21 | Discharge: 2015-07-21 | Disposition: A | Discharge status: Home / Self Care | Payer: No Typology Code available for payment source | Source: Ambulatory Visit | Attending: Radiation Oncology | Admitting: Radiation Oncology

## 2015-07-21 ENCOUNTER — Ambulatory Visit: Payer: No Typology Code available for payment source | Admitting: Radiation Oncology

## 2015-07-21 ENCOUNTER — Other Ambulatory Visit: Payer: Self-pay | Admitting: Radiology

## 2015-07-21 DIAGNOSIS — Z510 Encounter for antineoplastic radiation therapy: Secondary | ICD-10-CM | POA: Diagnosis not present

## 2015-07-22 ENCOUNTER — Encounter (HOSPITAL_COMMUNITY): Payer: Self-pay

## 2015-07-22 ENCOUNTER — Other Ambulatory Visit: Payer: Self-pay | Admitting: Oncology

## 2015-07-22 ENCOUNTER — Ambulatory Visit
Admission: RE | Admit: 2015-07-22 | Discharge: 2015-07-22 | Disposition: A | Discharge status: Home / Self Care | Payer: No Typology Code available for payment source | Source: Ambulatory Visit | Attending: Radiation Oncology | Admitting: Radiation Oncology

## 2015-07-22 ENCOUNTER — Ambulatory Visit (HOSPITAL_COMMUNITY)
Admission: RE | Admit: 2015-07-22 | Discharge: 2015-07-22 | Disposition: A | Discharge status: Home / Self Care | Payer: Medicare Other | Source: Ambulatory Visit | Attending: Oncology | Admitting: Oncology

## 2015-07-22 ENCOUNTER — Telehealth: Payer: Self-pay | Admitting: Radiation Oncology

## 2015-07-22 ENCOUNTER — Telehealth: Payer: Self-pay | Admitting: *Deleted

## 2015-07-22 DIAGNOSIS — I5022 Chronic systolic (congestive) heart failure: Secondary | ICD-10-CM | POA: Insufficient documentation

## 2015-07-22 DIAGNOSIS — C3491 Malignant neoplasm of unspecified part of right bronchus or lung: Secondary | ICD-10-CM | POA: Insufficient documentation

## 2015-07-22 DIAGNOSIS — E785 Hyperlipidemia, unspecified: Secondary | ICD-10-CM | POA: Insufficient documentation

## 2015-07-22 DIAGNOSIS — Z952 Presence of prosthetic heart valve: Secondary | ICD-10-CM | POA: Insufficient documentation

## 2015-07-22 DIAGNOSIS — I110 Hypertensive heart disease with heart failure: Secondary | ICD-10-CM | POA: Diagnosis not present

## 2015-07-22 DIAGNOSIS — Z95810 Presence of automatic (implantable) cardiac defibrillator: Secondary | ICD-10-CM | POA: Diagnosis not present

## 2015-07-22 DIAGNOSIS — Z880 Allergy status to penicillin: Secondary | ICD-10-CM | POA: Diagnosis not present

## 2015-07-22 DIAGNOSIS — Z7982 Long term (current) use of aspirin: Secondary | ICD-10-CM | POA: Insufficient documentation

## 2015-07-22 DIAGNOSIS — I080 Rheumatic disorders of both mitral and aortic valves: Secondary | ICD-10-CM | POA: Insufficient documentation

## 2015-07-22 DIAGNOSIS — Z87891 Personal history of nicotine dependence: Secondary | ICD-10-CM | POA: Diagnosis not present

## 2015-07-22 DIAGNOSIS — I2510 Atherosclerotic heart disease of native coronary artery without angina pectoris: Secondary | ICD-10-CM | POA: Insufficient documentation

## 2015-07-22 DIAGNOSIS — Z951 Presence of aortocoronary bypass graft: Secondary | ICD-10-CM | POA: Insufficient documentation

## 2015-07-22 DIAGNOSIS — Z882 Allergy status to sulfonamides status: Secondary | ICD-10-CM | POA: Diagnosis not present

## 2015-07-22 DIAGNOSIS — Z510 Encounter for antineoplastic radiation therapy: Secondary | ICD-10-CM | POA: Diagnosis not present

## 2015-07-22 DIAGNOSIS — C3401 Malignant neoplasm of right main bronchus: Secondary | ICD-10-CM

## 2015-07-22 LAB — CBC
HCT: 39.9 % (ref 39.0–52.0)
Hemoglobin: 13.6 g/dL (ref 13.0–17.0)
MCH: 30.7 pg (ref 26.0–34.0)
MCHC: 34.1 g/dL (ref 30.0–36.0)
MCV: 90.1 fL (ref 78.0–100.0)
PLATELETS: 248 10*3/uL (ref 150–400)
RBC: 4.43 MIL/uL (ref 4.22–5.81)
RDW: 14.3 % (ref 11.5–15.5)
WBC: 6.4 10*3/uL (ref 4.0–10.5)

## 2015-07-22 LAB — BASIC METABOLIC PANEL
Anion gap: 8 (ref 5–15)
BUN: 25 mg/dL — ABNORMAL HIGH (ref 6–20)
CHLORIDE: 99 mmol/L — AB (ref 101–111)
CO2: 27 mmol/L (ref 22–32)
CREATININE: 0.77 mg/dL (ref 0.61–1.24)
Calcium: 9.1 mg/dL (ref 8.9–10.3)
GFR calc non Af Amer: >60 mL/min (ref >60)
GLUCOSE: 105 mg/dL — AB (ref 65–99)
Potassium: 4.3 mmol/L (ref 3.5–5.1)
Sodium: 134 mmol/L — ABNORMAL LOW (ref 135–145)

## 2015-07-22 LAB — PROTIME-INR
INR: 1.08 (ref 0.00–1.49)
Prothrombin Time: 13.7 seconds (ref 11.6–15.2)

## 2015-07-22 LAB — APTT: APTT: 29 s (ref 24–37)

## 2015-07-22 MED ORDER — MIDAZOLAM HCL 2 MG/2ML IJ SOLN
INTRAMUSCULAR | Status: AC
  Filled 2015-07-22: qty 2

## 2015-07-22 MED ORDER — SODIUM CHLORIDE 0.9 % IV SOLN
Freq: Once | INTRAVENOUS | Status: AC
  Administered 2015-07-22: 14:00:00 via INTRAVENOUS

## 2015-07-22 MED ORDER — MIDAZOLAM HCL 2 MG/2ML IJ SOLN
INTRAMUSCULAR | Status: AC | PRN
  Administered 2015-07-22: 1 mg via INTRAVENOUS

## 2015-07-22 MED ORDER — FENTANYL CITRATE (PF) 100 MCG/2ML IJ SOLN
INTRAMUSCULAR | Status: AC | PRN
  Administered 2015-07-22: 50 ug via INTRAVENOUS

## 2015-07-22 MED ORDER — FENTANYL CITRATE (PF) 100 MCG/2ML IJ SOLN
INTRAMUSCULAR | Status: AC
  Filled 2015-07-22: qty 2

## 2015-07-22 MED ORDER — VANCOMYCIN HCL IN DEXTROSE 1-5 GM/200ML-% IV SOLN
1000.0000 mg | INTRAVENOUS | Status: AC
  Administered 2015-07-22: 1000 mg via INTRAVENOUS
  Filled 2015-07-22: qty 200

## 2015-07-22 NOTE — Discharge Instructions (Signed)
Moderate Conscious Sedation, Adult, Care After Refer to this sheet in the next few weeks. These instructions provide you with information on caring for yourself after your procedure. Your health care provider may also give you more specific instructions. Your treatment has been planned according to current medical practices, but problems sometimes occur. Call your health care provider if you have any problems or questions after your procedure. WHAT TO EXPECT AFTER THE PROCEDURE  After your procedure:  You may feel sleepy, clumsy, and have poor balance for several hours.  Vomiting may occur if you eat too soon after the procedure. HOME CARE INSTRUCTIONS  Do not participate in any activities where you could become injured for at least 24 hours. Do not:  Drive.  Swim.  Ride a bicycle.  Operate heavy machinery.  Cook.  Use power tools.  Climb ladders.  Work from a high place.  Do not make important decisions or sign legal documents until you are improved.  If you vomit, drink water, juice, or soup when you can drink without vomiting. Make sure you have little or no nausea before eating solid foods.  Only take over-the-counter or prescription medicines for pain, discomfort, or fever as directed by your health care provider.  Make sure you and your family fully understand everything about the medicines given to you, including what side effects may occur.  You should not drink alcohol, take sleeping pills, or take medicines that cause drowsiness for at least 24 hours.  If you smoke, do not smoke without supervision.  If you are feeling better, you may resume normal activities 24 hours after you were sedated.  Keep all appointments with your health care provider. SEEK MEDICAL CARE IF:  Your skin is pale or bluish in color.  You continue to feel nauseous or vomit.  Your pain is getting worse and is not helped by medicine.  You have bleeding or swelling.  You are still  sleepy or feeling clumsy after 24 hours. SEEK IMMEDIATE MEDICAL CARE IF:  You develop a rash.  You have difficulty breathing.  You develop any type of allergic problem.  You have a fever. MAKE SURE YOU:  Understand these instructions.  Will watch your condition.  Will get help right away if you are not doing well or get worse.   This information is not intended to replace advice given to you by your health care provider. Make sure you discuss any questions you have with your health care provider.   Document Released: 10/31/2012 Document Revised: 01/31/2014 Document Reviewed: 10/31/2012 Elsevier Interactive Patient Education 2016 [place] An implanted port is a type of central line that is placed under the skin. Central lines are used to provide IV access when treatment or nutrition needs to be given through a person's veins. Implanted ports are used for long-term IV access. An implanted port may be placed because:   You need IV medicine that would be irritating to the small veins in your hands or arms.   You need long-term IV medicines, such as antibiotics.   You need IV nutrition for a long period.   You need frequent blood draws for lab tests.   You need dialysis.  Implanted ports are usually placed in the chest area, but they can also be placed in the upper arm, the abdomen, or the leg. An implanted port has two main parts:   Reservoir. The reservoir is round and will appear as a small, raised  area under your skin. The reservoir is the part where a needle is inserted to give medicines or draw blood.   Catheter. The catheter is a thin, flexible tube that extends from the reservoir. The catheter is placed into a large vein. Medicine that is inserted into the reservoir goes into the catheter and then into the vein.  HOW WILL I CARE FOR MY INCISION SITE? Do not get the incision site wet. Bathe or shower as directed by your health care  provider.  HOW IS MY PORT ACCESSED? Special steps must be taken to access the port:   Before the port is accessed, a numbing cream can be placed on the skin. This helps numb the skin over the port site.   Your health care provider uses a sterile technique to access the port.  Your health care provider must put on a mask and sterile gloves.  The skin over your port is cleaned carefully with an antiseptic and allowed to dry.  The port is gently pinched between sterile gloves, and a needle is inserted into the port.  Only "non-coring" port needles should be used to access the port. Once the port is accessed, a blood return should be checked. This helps ensure that the port is in the vein and is not clogged.   If your port needs to remain accessed for a constant infusion, a clear (transparent) bandage will be placed over the needle site. The bandage and needle will need to be changed every week, or as directed by your health care provider.   Keep the bandage covering the needle clean and dry. Do not get it wet. Follow your health care provider's instructions on how to take a shower or bath while the port is accessed.   If your port does not need to stay accessed, no bandage is needed over the port.  WHAT IS FLUSHING? Flushing helps keep the port from getting clogged. Follow your health care provider's instructions on how and when to flush the port. Ports are usually flushed with saline solution or a medicine called heparin. The need for flushing will depend on how the port is used.   If the port is used for intermittent medicines or blood draws, the port will need to be flushed:   After medicines have been given.   After blood has been drawn.   As part of routine maintenance.   If a constant infusion is running, the port may not need to be flushed.  HOW LONG WILL MY PORT STAY IMPLANTED? The port can stay in for as long as your health care provider thinks it is needed. When it  is time for the port to come out, surgery will be done to remove it. The procedure is similar to the one performed when the port was put in.  WHEN SHOULD I SEEK IMMEDIATE MEDICAL CARE? When you have an implanted port, you should seek immediate medical care if:   You notice a bad smell coming from the incision site.   You have swelling, redness, or drainage at the incision site.   You have more swelling or pain at the port site or the surrounding area.   You have a fever that is not controlled with medicine.   This information is not intended to replace advice given to you by your health care provider. Make sure you discuss any questions you have with your health care provider.   Document Released: 01/10/2005 Document Revised: 10/31/2012 Document Reviewed: 09/17/2012 Elsevier Interactive  Patient Education 2016 [place] Insertion, Care After Refer to this sheet in the next few weeks. These instructions provide you with information on caring for yourself after your procedure. Your health care provider may also give you more specific instructions. Your treatment has been planned according to current medical practices, but problems sometimes occur. Call your health care provider if you have any problems or questions after your procedure. WHAT TO EXPECT AFTER THE PROCEDURE After your procedure, it is typical to have the following:   Discomfort at the port insertion site. Ice packs to the area will help.  Bruising on the skin over the port. This will subside in 3-4 days. HOME CARE INSTRUCTIONS  After your port is placed, you will get a manufacturer's information card. The card has information about your port. Keep this card with you at all times.   Know what kind of port you have. There are many types of ports available.   Wear a medical alert bracelet in case of an emergency. This can help alert health care workers that you have a port.   The port can stay in  for as long as your health care provider believes it is necessary.   A home health care nurse may give medicines and take care of the port.   You or a family member can get special training and directions for giving medicine and taking care of the port at home.  SEEK MEDICAL CARE IF:   Your port does not flush or you are unable to get a blood return.   You have a fever or chills. SEEK IMMEDIATE MEDICAL CARE IF:  You have new fluid or pus coming from your incision.   You notice a bad smell coming from your incision site.   You have swelling, pain, or more redness at the incision or port site.   You have chest pain or shortness of breath.   This information is not intended to replace advice given to you by your health care provider. Make sure you discuss any questions you have with your health care provider.   Document Released: 10/31/2012 Document Revised: 01/15/2013 Document Reviewed: 10/31/2012 Elsevier Interactive Patient Education [employer].

## 2015-07-22 NOTE — Consult Note (Signed)
Chief Complaint: Patient was seen in consultation today for Port-A-Cath placement  Referring Physician(s): Wyatt Portela  Supervising Physician: Sandi Mariscal  Patient Status: Outpatient  History of Present Illness: Gabriel Marsh is a 82 y.o. male with recently diagnosed squamous cell carcinoma of the right lung, stage IIIA disease, who presents today for Port-A-Cath placement for chemotherapy. He is undergoing chemoradiation at this time. Additional medical history as outlined below.  Past Medical History  Diagnosis Date  . Hypertension   . Hyperlipidemia   . Mitral regurgitation     Moderate by 08/20/11 echo  . Shortness of breath   . CAD (coronary artery disease)     a) 08/22/11 cath :  50-60% left main stenosis, mild-moderate LAD disease, 99% ostial large diagonal stenosis, occluded and collateralized LCx and RCA  . Aortic stenosis     Severe by 08/20/11 echo  . Aortic insufficiency     Moderate by 08/20/11 echo  . Chronic systolic CHF (congestive heart failure) ([place])     a) 08/20/11 echo : LVEF 35-40%    Past Surgical History  Procedure Laterality Date  . Eye surgery    . Cardiac catheterization  08/22/11     50-60% left main stenosis, mild-moderate LAD disease, 99% ostial large diagonal stenosis, occluded and collateralized LCx and RCA  . Coronary artery bypass graft  08/25/2011    Procedure: CORONARY ARTERY BYPASS GRAFTING (CABG);  Surgeon: Grace Isaac, MD;  Location: [facility];  Service: Open Heart Surgery;  Laterality: N/A;  Hot and humid all through surgery.  . Aortic valve replacement  08/25/2011    Procedure: AORTIC VALVE REPLACEMENT (AVR);  Surgeon: Grace Isaac, MD;  Location: [facility];  Service: Open Heart Surgery;  Laterality: N/A;  hot and humid all through surgery.  . Intra-aortic balloon pump insertion N/A 08/24/2011    Procedure: INTRA-AORTIC BALLOON PUMP INSERTION;  Surgeon: Peter M Martinique, MD;  Location: [facility] CATH LAB;  Service: Cardiovascular;   Laterality: N/A;  . Implantable cardioverter defibrillator implant N/A 09/06/2011    Procedure: IMPLANTABLE CARDIOVERTER DEFIBRILLATOR IMPLANT;  Surgeon: Evans Lance, MD;  Location: [facility] CATH LAB;  Service: Cardiovascular;  Laterality: N/A;    Allergies: Nifedipine; Fish allergy; Lisinopril; Penicillins; Statins; and Sulfa antibiotics  Medications: Prior to Admission medications   Medication Sig Start Date End Date Taking? Authorizing Provider  acetaminophen (TYLENOL) 500 MG tablet Take 500-1,000 mg by mouth every 6 (six) hours as needed for moderate pain.   Yes Historical Provider, MD  amiodarone (PACERONE) 200 MG tablet Take 1 tablet (200 mg total) by mouth daily. 12/29/11 07/22/15 Yes Evans Lance, MD  aspirin EC 81 MG tablet Take 81 mg by mouth daily.   Yes Historical Provider, MD  docusate sodium (COLACE) 100 MG capsule Take 1 capsule (100 mg total) by mouth 2 (two) times daily. 07/13/15  Yes Oswald Hillock, MD  guaiFENesin-codeine 100-10 MG/5ML syrup Take 5-10 mLs by mouth every 4 (four) hours as needed for cough. 07/20/15  Yes Wyatt Portela, MD  hydrochlorothiazide (HYDRODIURIL) 25 MG tablet Take 25 mg by mouth daily.   Yes Historical Provider, MD  HYDROcodone-acetaminophen (NORCO/VICODIN) 5-325 MG tablet Take 1 tablet by mouth every 4 (four) hours as needed for moderate pain. 07/13/15  Yes Oswald Hillock, MD  ipratropium-albuterol (DUONEB) 0.5-2.5 (3) MG/3ML SOLN Take 3 mLs by nebulization 4 (four) times daily. 07/13/15  Yes Oswald Hillock, MD  losartan (COZAAR) 100 MG tablet Take 100 mg  by mouth daily.   Yes Historical Provider, MD  metoprolol tartrate (LOPRESSOR) 25 MG tablet Take 1 tablet (25 mg total) by mouth 2 (two) times daily. 09/16/11 07/22/15 Yes Daniel J Angiulli, PA-C  omeprazole (PRILOSEC) 20 MG capsule Take 20 mg by mouth daily.   Yes Historical Provider, MD  polyethylene glycol (MIRALAX / GLYCOLAX) packet Take 17 g by mouth every other day. 07/13/15  Yes Oswald Hillock, MD    prochlorperazine (COMPAZINE) 10 MG tablet Take 1 tablet (10 mg total) by mouth every 6 (six) hours as needed for nausea or vomiting. 07/14/15  Yes Wyatt Portela, MD  simvastatin (ZOCOR) 10 MG tablet Take 5 mg by mouth every evening.   Yes Historical Provider, MD  lidocaine-prilocaine (EMLA) cream Apply 1 application topically as needed. Apply 1/2 tsp to skin over port 1 hour  prior to chemotherapy treatment. Do not rub in. Cover with plastic only 07/16/15   Wyatt Portela, MD  Wound Dressings (SONAFINE) Apply 1 application topically daily.    Historical Provider, MD     Family History  Problem Relation Age of Onset  . Heart disease Brother     Social History   Social History  . Marital Status: Single    Spouse Name: N/A  . Number of Children: N/A  . Years of Education: N/A   Social History Main Topics  . Smoking status: Former Smoker    Quit date: 01/25/1995  . Smokeless tobacco: None  . Alcohol Use: No  . Drug Use: No  . Sexual Activity: Not Asked   Other Topics Concern  . None   Social History Narrative      Review of Systems  Constitutional: Negative for fever and chills.  Respiratory: Positive for cough and shortness of breath.   Cardiovascular: Negative for chest pain.  Gastrointestinal: Positive for nausea. Negative for vomiting, abdominal pain and blood in stool.  Genitourinary: Negative for dysuria and hematuria.  Musculoskeletal: Negative for back pain.       Shoulder pain  Neurological: Negative for headaches.    Vital Signs: BP 115/65 mmHg  Pulse 78  Temp(Src) 97.7 F (36.5 C) (Oral)  Resp 20  Ht '5\' 10"'$  (1.778 m)  Wt 160 lb (72.576 kg)  BMI 22.96 kg/m2  SpO2 96%  Physical Exam  Constitutional: He is oriented to person, place, and time. He appears well-developed and well-nourished.  Cardiovascular: Normal rate and regular rhythm.   Left chest wall AICD  Pulmonary/Chest: Effort normal.  scatt wheezes bilat lung fields  Abdominal: Soft. Bowel  sounds are normal. There is no tenderness.  Musculoskeletal: He exhibits no edema.  Neurological: He is alert and oriented to person, place, and time.    Mallampati Score:     Imaging: X-ray Chest Pa And Lateral  07/09/2015  CLINICAL DATA:  Known right-sided lung carcinoma EXAM: CHEST  2 VIEW COMPARISON:  07/03/2015 FINDINGS: Cardiac shadow is within normal limits. The known central and right-sided mass lesion is not well appreciated due to overlying mediastinal shadows. Postsurgical changes are seen. A defibrillator is again noted. No focal infiltrate or sizable effusion is seen. IMPRESSION: No new acute abnormality is noted. The known mass centrally on the right is not well appreciated due to the tortuous aorta. Electronically Signed   By: [name] M.D.   On: 07/09/2015 21:57   Nm Pet Image Initial (pi) Skull Base To Thigh  07/03/2015  CLINICAL DATA:  Initial treatment strategy for RIGHT lung mass. EXAM:  NUCLEAR MEDICINE PET SKULL BASE TO THIGH TECHNIQUE: 8.4 mCi F-18 FDG was injected intravenously. Full-ring PET imaging was performed from the skull base to thigh after the radiotracer. CT data was obtained and used for attenuation correction and anatomic localization. FASTING BLOOD GLUCOSE:  Value: 95 mg/dl COMPARISON:  None available. FINDINGS: NECK No hypermetabolic lymph nodes in the neck. CHEST Large hypermetabolic mass surrounds the bronchus intermedius with intense metabolic activity. Mass measures 3.9 by 3.5 cm at the level of the bronchus intermedius (image 66, series 4) with intense metabolic activity (insert SUV max 30.6). Mass extends superiorly and posterior to the carina with contiguous infiltrative pattern (image 59, series 4). Mass measures approximately 9 cm in craniocaudad dimension the from mid tracheal level to the distal aspect of the bronchus intermedius. There is no hypermetabolic supraclavicular nodes. There is no hypermetabolic pulmonary nodules. There is a mild reticular  pattern in the LEFT lung suggesting chronic interstitial lung disease. Esophagus appears normal. The mass lesion within posterior mediastinum contacts the esophagus at the level of the carina with mild leftward displacement. ABDOMEN/PELVIS No abnormal hypermetabolic activity within the liver, pancreas, adrenal glands, or spleen. No hypermetabolic lymph nodes in the abdomen or pelvis. Sigmoid diverticulosis. Prostate hypertrophy. Atherosclerotic calcification aorta SKELETON No focal hypermetabolic activity to suggest skeletal metastasis. IMPRESSION: 1. Large intensely hypermetabolic mass surrounding the bronchus intermedius and infiltrating the posterior mediastinum most consistent with bronchogenic carcinoma. Consider small cell carcinoma. 2. Mass lesion contacts the wall of the esophagus without clear evidence of invasion. 3. No discrete hypermetabolic pulmonary nodules. These results will be called to the ordering clinician or representative by the Radiologist Assistant, and communication documented in the PACS or zVision Dashboard. Electronically Signed   By: Suzy Bouchard M.D.   On: 07/03/2015 15:29    Labs:  CBC:  Recent Labs  07/09/15 2132 07/11/15 0351 07/17/15 0813 07/22/15 1318  WBC 11.2* 6.4 7.4 6.4  HGB 14.1 12.1* 13.8 13.6  HCT 42.3 36.5* 42.0 39.9  PLT 224 202 275 248    COAGS:  Recent Labs  07/22/15 1318  INR 1.08  APTT 29    BMP:  Recent Labs  07/09/15 2132 07/11/15 0351 07/17/15 0813  NA 134* 135 138  K 3.7 3.8 4.4  CL 103 104  --   CO2 '24 25 28  '$ GLUCOSE 123* 94 90  BUN 21* 18 14.3  CALCIUM 9.2 8.5* 9.9  CREATININE 1.01 0.98 0.8  GFRNONAA >60 >60  --   GFRAA >60 >60  --     LIVER FUNCTION TESTS:  Recent Labs  07/09/15 2132 07/17/15 0813  BILITOT 0.9 0.58  AST 21 23  ALT 15* 25  ALKPHOS 76 78  PROT 6.9 7.4  ALBUMIN 3.7 3.3*    TUMOR MARKERS: No results for input(s): AFPTM, CEA, CA199, CHROMGRNA in the last 8760 hours.  Assessment and  Plan: 82 y.o. male with recently diagnosed squamous cell carcinoma of the right lung, stage IIIA disease, who presents today for Port-A-Cath placement for chemotherapy. He is undergoing chemoradiation at this time. Risks and benefits discussed with the patient including, but not limited to bleeding, infection, pneumothorax, or fibrin sheath development and need for additional procedures.All of the patient's questions were answered, patient is agreeable to proceed. Consent signed and in chart.     Thank you for this interesting consult.  I greatly enjoyed meeting Jafeth Mustin and look forward to participating in their care.  A copy of this report was sent to the  requesting provider on this date.  Electronically Signed: D. Rowe Robert 07/22/2015, 2:00 PM   I spent a total of 20 minutes  in face to face in clinical consultation, greater than 50% of which was counseling/coordinating care for port a cath placement

## 2015-07-22 NOTE — Telephone Encounter (Signed)
Niece called stating patient was having a power port a cath placement today and has to be NPO after 8am, ,asked if he could take 2 extra strength  tylenol before radiation because his shoulder hurt so bad, no pain medication is helping even the rx pain med", informed her no, she could call the surgeon ,will inform Dr. Lisbeth Renshaw that patient's pain meds isn't helping with his shoulder pain when lying on linac table for radiation 8:42 AM

## 2015-07-22 NOTE — Telephone Encounter (Signed)
I spoke with the patient's niece and he did well today with the modifications made at the machine for his treatment. We discussed the use of Norco 2 tabs 30 minutes prior to his radiation. If this does not help tomorrow, we could go to oxycodone. [name] will keep me informed.

## 2015-07-22 NOTE — Procedures (Signed)
Successful placement of right IJ approach port-a-cath with tip at the superior caval atrial junction. The catheter is ready for immediate use. Estimated Blood Loss: Minimal No immediate post procedural complications.  Note was made of a sizable skin tear at the desired location of the port reservoir site, however a left IJ port could NOT be placed secondary to the left anterior chest wall pacemaker.  Ronny Bacon, MD Pager #: [phone]

## 2015-07-23 ENCOUNTER — Ambulatory Visit
Admission: RE | Admit: 2015-07-23 | Discharge: 2015-07-23 | Disposition: A | Discharge status: Home / Self Care | Payer: No Typology Code available for payment source | Source: Ambulatory Visit | Attending: Radiation Oncology | Admitting: Radiation Oncology

## 2015-07-23 DIAGNOSIS — Z510 Encounter for antineoplastic radiation therapy: Secondary | ICD-10-CM | POA: Diagnosis not present

## 2015-07-24 ENCOUNTER — Ambulatory Visit
Admission: RE | Admit: 2015-07-24 | Discharge: 2015-07-24 | Disposition: A | Discharge status: Home / Self Care | Payer: No Typology Code available for payment source | Source: Ambulatory Visit | Attending: Radiation Oncology | Admitting: Radiation Oncology

## 2015-07-24 ENCOUNTER — Encounter: Payer: Self-pay | Admitting: Radiation Oncology

## 2015-07-24 ENCOUNTER — Other Ambulatory Visit (HOSPITAL_BASED_OUTPATIENT_CLINIC_OR_DEPARTMENT_OTHER): Payer: Medicare Other

## 2015-07-24 ENCOUNTER — Ambulatory Visit
Admission: RE | Admit: 2015-07-24 | Discharge: 2015-07-24 | Disposition: A | Discharge status: Home / Self Care | Payer: Non-veteran care | Source: Ambulatory Visit | Attending: Radiation Oncology | Admitting: Radiation Oncology

## 2015-07-24 ENCOUNTER — Telehealth: Payer: Self-pay | Admitting: Oncology

## 2015-07-24 ENCOUNTER — Ambulatory Visit: Payer: No Typology Code available for payment source

## 2015-07-24 ENCOUNTER — Ambulatory Visit (HOSPITAL_BASED_OUTPATIENT_CLINIC_OR_DEPARTMENT_OTHER): Payer: Medicare Other

## 2015-07-24 VITALS — BP 107/68 | HR 66 | Temp 97.7°F | Resp 18

## 2015-07-24 VITALS — BP 95/52 | HR 73 | Temp 98.0°F | Resp 20 | Wt 164.0 lb

## 2015-07-24 DIAGNOSIS — C3431 Malignant neoplasm of lower lobe, right bronchus or lung: Secondary | ICD-10-CM | POA: Diagnosis present

## 2015-07-24 DIAGNOSIS — C3401 Malignant neoplasm of right main bronchus: Secondary | ICD-10-CM

## 2015-07-24 DIAGNOSIS — Z510 Encounter for antineoplastic radiation therapy: Secondary | ICD-10-CM | POA: Diagnosis not present

## 2015-07-24 DIAGNOSIS — Z5111 Encounter for antineoplastic chemotherapy: Secondary | ICD-10-CM | POA: Diagnosis present

## 2015-07-24 DIAGNOSIS — C3490 Malignant neoplasm of unspecified part of unspecified bronchus or lung: Secondary | ICD-10-CM

## 2015-07-24 LAB — CBC WITH DIFFERENTIAL/PLATELET
BASO%: 0.3 % (ref 0.0–2.0)
Basophils Absolute: 0 10*3/uL (ref 0.0–0.1)
EOS%: 0.7 % (ref 0.0–7.0)
Eosinophils Absolute: 0 10*3/uL (ref 0.0–0.5)
HEMATOCRIT: 39.1 % (ref 38.4–49.9)
HEMOGLOBIN: 12.9 g/dL — AB (ref 13.0–17.1)
LYMPH#: 0.3 10*3/uL — AB (ref 0.9–3.3)
LYMPH%: 6.4 % — ABNORMAL LOW (ref 14.0–49.0)
MCH: 30.6 pg (ref 27.2–33.4)
MCHC: 33 g/dL (ref 32.0–36.0)
MCV: 92.8 fL (ref 79.3–98.0)
MONO#: 0.4 10*3/uL (ref 0.1–0.9)
MONO%: 7.2 % (ref 0.0–14.0)
NEUT%: 85.4 % — AB (ref 39.0–75.0)
NEUTROS ABS: 4.6 10*3/uL (ref 1.5–6.5)
PLATELETS: 238 10*3/uL (ref 140–400)
RBC: 4.22 10*6/uL (ref 4.20–5.82)
RDW: 14.4 % (ref 11.0–14.6)
WBC: 5.4 10*3/uL (ref 4.0–10.3)

## 2015-07-24 LAB — COMPREHENSIVE METABOLIC PANEL
ALBUMIN: 3 g/dL — AB (ref 3.5–5.0)
ALT: 18 U/L (ref 0–55)
ANION GAP: 5 meq/L (ref 3–11)
AST: 18 U/L (ref 5–34)
Alkaline Phosphatase: 73 U/L (ref 40–150)
BILIRUBIN TOTAL: 0.42 mg/dL (ref 0.20–1.20)
BUN: 20.4 mg/dL (ref 7.0–26.0)
CALCIUM: 9.4 mg/dL (ref 8.4–10.4)
CO2: 31 mEq/L — ABNORMAL HIGH (ref 22–29)
CREATININE: 0.8 mg/dL (ref 0.7–1.3)
Chloride: 98 mEq/L (ref 98–109)
EGFR: 81 mL/min/{1.73_m2} — ABNORMAL LOW (ref >90)
Glucose: 102 mg/dl (ref 70–140)
Potassium: 4.5 mEq/L (ref 3.5–5.1)
Sodium: 134 mEq/L — ABNORMAL LOW (ref 136–145)
TOTAL PROTEIN: 6.7 g/dL (ref 6.4–8.3)

## 2015-07-24 MED ORDER — DIPHENHYDRAMINE HCL 50 MG/ML IJ SOLN
INTRAMUSCULAR | Status: AC
  Filled 2015-07-24: qty 1

## 2015-07-24 MED ORDER — PALONOSETRON HCL INJECTION 0.25 MG/5ML
0.2500 mg | Freq: Once | INTRAVENOUS | Status: AC
  Administered 2015-07-24: 0.25 mg via INTRAVENOUS

## 2015-07-24 MED ORDER — SODIUM CHLORIDE 0.9 % IV SOLN
Freq: Once | INTRAVENOUS | Status: AC
  Administered 2015-07-24: 12:00:00 via INTRAVENOUS

## 2015-07-24 MED ORDER — SODIUM CHLORIDE 0.9% FLUSH
10.0000 mL | INTRAVENOUS | Status: DC | PRN
  Administered 2015-07-24: 10 mL
  Filled 2015-07-24: qty 10

## 2015-07-24 MED ORDER — DIPHENHYDRAMINE HCL 50 MG/ML IJ SOLN
50.0000 mg | Freq: Once | INTRAMUSCULAR | Status: AC
  Administered 2015-07-24: 25 mg via INTRAVENOUS

## 2015-07-24 MED ORDER — HEPARIN SOD (PORK) LOCK FLUSH 100 UNIT/ML IV SOLN
500.0000 [IU] | Freq: Once | INTRAVENOUS | Status: AC | PRN
  Administered 2015-07-24: 500 [IU]
  Filled 2015-07-24: qty 5

## 2015-07-24 MED ORDER — SODIUM CHLORIDE 0.9 % IV SOLN
20.0000 mg | Freq: Once | INTRAVENOUS | Status: AC
  Administered 2015-07-24: 20 mg via INTRAVENOUS
  Filled 2015-07-24: qty 2

## 2015-07-24 MED ORDER — SODIUM CHLORIDE 0.9 % IV SOLN
177.2000 mg | Freq: Once | INTRAVENOUS | Status: AC
  Administered 2015-07-24: 180 mg via INTRAVENOUS
  Filled 2015-07-24: qty 18

## 2015-07-24 MED ORDER — SODIUM CHLORIDE 0.9 % IV SOLN
45.0000 mg/m2 | Freq: Once | INTRAVENOUS | Status: AC
  Administered 2015-07-24: 90 mg via INTRAVENOUS
  Filled 2015-07-24: qty 15

## 2015-07-24 MED ORDER — FAMOTIDINE IN NACL 20-0.9 MG/50ML-% IV SOLN
20.0000 mg | Freq: Once | INTRAVENOUS | Status: AC
  Administered 2015-07-24: 20 mg via INTRAVENOUS

## 2015-07-24 MED ORDER — PALONOSETRON HCL INJECTION 0.25 MG/5ML
INTRAVENOUS | Status: AC
  Filled 2015-07-24: qty 5

## 2015-07-24 MED ORDER — FAMOTIDINE IN NACL 20-0.9 MG/50ML-% IV SOLN
INTRAVENOUS | Status: AC
  Filled 2015-07-24: qty 50

## 2015-07-24 NOTE — Patient Instructions (Addendum)
[facility] Discharge Instructions for Patients Receiving Chemotherapy  Today you received the following chemotherapy agents Taxol.  To help prevent nausea and vomiting after your treatment, we encourage you to take your nausea medication as directed.   If you develop nausea and vomiting that is not controlled by your nausea medication, call the clinic.   BELOW ARE SYMPTOMS THAT SHOULD BE REPORTED IMMEDIATELY:  *FEVER GREATER THAN 100.5 F  *CHILLS WITH OR WITHOUT FEVER  NAUSEA AND VOMITING THAT IS NOT CONTROLLED WITH YOUR NAUSEA MEDICATION  *UNUSUAL SHORTNESS OF BREATH  *UNUSUAL BRUISING OR BLEEDING  TENDERNESS IN MOUTH AND THROAT WITH OR WITHOUT PRESENCE OF ULCERS  *URINARY PROBLEMS  *BOWEL PROBLEMS  UNUSUAL RASH Items with * indicate a potential emergency and should be followed up as soon as possible.  Feel free to call the clinic you have any questions or concerns. The clinic phone number is [phone].  Please show the [facility] at check-in to the Emergency Department and triage nurse.   Implanted [facility] Guide An implanted port is a type of central line that is placed under the skin. Central lines are used to provide IV access when treatment or nutrition needs to be given through a person's veins. Implanted ports are used for long-term IV access. An implanted port may be placed because:   You need IV medicine that would be irritating to the small veins in your hands or arms.   You need long-term IV medicines, such as antibiotics.   You need IV nutrition for a long period.   You need frequent blood draws for lab tests.   You need dialysis.  Implanted ports are usually placed in the chest area, but they can also be placed in the upper arm, the abdomen, or the leg. An implanted port has two main parts:   Reservoir. The reservoir is round and will appear as a small, raised area under your skin. The reservoir is the part where a needle  is inserted to give medicines or draw blood.   Catheter. The catheter is a thin, flexible tube that extends from the reservoir. The catheter is placed into a large vein. Medicine that is inserted into the reservoir goes into the catheter and then into the vein.  HOW WILL I CARE FOR MY INCISION SITE? Do not get the incision site wet. Bathe or shower as directed by your health care provider.  HOW IS MY PORT ACCESSED? Special steps must be taken to access the port:   Before the port is accessed, a numbing cream can be placed on the skin. This helps numb the skin over the port site.   Your health care provider uses a sterile technique to access the port.  Your health care provider must put on a mask and sterile gloves.  The skin over your port is cleaned carefully with an antiseptic and allowed to dry.  The port is gently pinched between sterile gloves, and a needle is inserted into the port.  Only "non-coring" port needles should be used to access the port. Once the port is accessed, a blood return should be checked. This helps ensure that the port is in the vein and is not clogged.   If your port needs to remain accessed for a constant infusion, a clear (transparent) bandage will be placed over the needle site. The bandage and needle will need to be changed every week, or as directed by your health care provider.   Keep  the bandage covering the needle clean and dry. Do not get it wet. Follow your health care provider's instructions on how to take a shower or bath while the port is accessed.   If your port does not need to stay accessed, no bandage is needed over the port.  WHAT IS FLUSHING? Flushing helps keep the port from getting clogged. Follow your health care provider's instructions on how and when to flush the port. Ports are usually flushed with saline solution or a medicine called heparin. The need for flushing will depend on how the port is used.   If the port is used for  intermittent medicines or blood draws, the port will need to be flushed:   After medicines have been given.   After blood has been drawn.   As part of routine maintenance.   If a constant infusion is running, the port may not need to be flushed.  HOW LONG WILL MY PORT STAY IMPLANTED? The port can stay in for as long as your health care provider thinks it is needed. When it is time for the port to come out, surgery will be done to remove it. The procedure is similar to the one performed when the port was put in.  WHEN SHOULD I SEEK IMMEDIATE MEDICAL CARE? When you have an implanted port, you should seek immediate medical care if:   You notice a bad smell coming from the incision site.   You have swelling, redness, or drainage at the incision site.   You have more swelling or pain at the port site or the surrounding area.   You have a fever that is not controlled with medicine.   This information is not intended to replace advice given to you by your health care provider. Make sure you discuss any questions you have with your health care provider.   Document Released: 01/10/2005 Document Revised: 10/31/2012 Document Reviewed: 09/17/2012 Elsevier Interactive Patient Education [employer].

## 2015-07-24 NOTE — Progress Notes (Addendum)
Weekly rad txs lung/mediastinum,no skin irritation ,hasn't started using sonaine cream as yet, on 2 liters n/c, low b/p , took in both arms, weakness, poor appetite,  Eats soft foods, drinks ensure plus,no c/o pain, except shoulder hurting from lianc table lying with arms up with treatment  BP 90/49 mmHg  Pulse 77  Temp(Src) 98 F (36.7 C)  Resp 20  Wt 164 lb (74.39 kg)  SpO2 100% right arm sitting  BP 95/52 mmHg  Pulse 73  Temp(Src) 98 F (36.7 C)  Resp 20  Wt 164 lb (74.39 kg)  SpO2 100%left arm sitting Wt Readings from Last 3 Encounters:  07/24/15 164 lb (74.39 kg)  07/22/15 160 lb (72.576 kg)  07/17/15 164 lb 14.4 oz (74.798 kg)

## 2015-07-24 NOTE — Telephone Encounter (Signed)
pt preferred to have flush appt added to sch-gave updated sch

## 2015-07-24 NOTE — Progress Notes (Signed)
Department of Radiation Oncology  Phone:  [phone] Fax:        [phone]  Weekly Treatment Note    Name: Gabriel Marsh Date: 07/24/2015 MRN: [id number] DOB: [birth year]   Diagnosis:     ICD-9-CM ICD-10-CM   1. Malignant neoplasm of lung, unspecified laterality, unspecified part of lung ([place]) 162.9 C34.90   2. Cancer of carina of right bronchus ([facility]) 162.2 C34.01      Current dose: 20 Gy  Current fraction: 10   MEDICATIONS: Current Outpatient Prescriptions  Medication Sig Dispense Refill  . acetaminophen (TYLENOL) 500 MG tablet Take 500-1,000 mg by mouth every 6 (six) hours as needed for moderate pain.    Marland Kitchen aspirin EC 81 MG tablet Take 81 mg by mouth daily.    Marland Kitchen docusate sodium (COLACE) 100 MG capsule Take 1 capsule (100 mg total) by mouth 2 (two) times daily. 10 capsule 0  . guaiFENesin-codeine 100-10 MG/5ML syrup Take 5-10 mLs by mouth every 4 (four) hours as needed for cough. 240 mL 0  . hydrochlorothiazide (HYDRODIURIL) 25 MG tablet Take 25 mg by mouth daily.    Marland Kitchen HYDROcodone-acetaminophen (NORCO/VICODIN) 5-325 MG tablet Take 1 tablet by mouth every 4 (four) hours as needed for moderate pain. 30 tablet 0  . ipratropium-albuterol (DUONEB) 0.5-2.5 (3) MG/3ML SOLN Take 3 mLs by nebulization 4 (four) times daily. 360 mL 2  . lidocaine-prilocaine (EMLA) cream Apply 1 application topically as needed. Apply 1/2 tsp to skin over port 1 hour  prior to chemotherapy treatment. Do not rub in. Cover with plastic only 30 g 1  . losartan (COZAAR) 100 MG tablet Take 100 mg by mouth daily.    Marland Kitchen omeprazole (PRILOSEC) 20 MG capsule Take 20 mg by mouth daily.    . polyethylene glycol (MIRALAX / GLYCOLAX) packet Take 17 g by mouth every other day. 14 each 2  . prochlorperazine (COMPAZINE) 10 MG tablet Take 1 tablet (10 mg total) by mouth every 6 (six) hours as needed for nausea or vomiting. 30 tablet 0  . simvastatin (ZOCOR) 10 MG tablet Take 5 mg by mouth every evening.    . Wound  Dressings (SONAFINE) Apply 1 application topically daily.    Marland Kitchen amiodarone (PACERONE) 200 MG tablet Take 1 tablet (200 mg total) by mouth daily. 30 tablet 11  . metoprolol tartrate (LOPRESSOR) 25 MG tablet Take 1 tablet (25 mg total) by mouth 2 (two) times daily. 60 tablet 1   No current facility-administered medications for this encounter.     ALLERGIES: Nifedipine; Fish allergy; Lisinopril; Penicillins; Statins; and Sulfa antibiotics   LABORATORY DATA:  Lab Results  Component Value Date   WBC 6.4 07/22/2015   HGB 13.6 07/22/2015   HCT 39.9 07/22/2015   MCV 90.1 07/22/2015   PLT 248 07/22/2015   Lab Results  Component Value Date   NA 134* 07/22/2015   K 4.3 07/22/2015   CL 99* 07/22/2015   CO2 27 07/22/2015   Lab Results  Component Value Date   ALT 25 07/17/2015   AST 23 07/17/2015   ALKPHOS 78 07/17/2015   BILITOT 0.58 07/17/2015     NARRATIVE: Gabriel Marsh was seen today for weekly treatment management. The chart was checked and the patient's films were reviewed.  Weekly rad txs lung/mediastinum,no skin irritation ,hasn't started using sonaine cream as yet, on 2 liters n/c, low b/p , took in both arms, weakness, poor appetite,  Eats soft foods, drinks ensure plus,no c/o pain, except shoulder hurting  from lianc table lying with arms up with treatment  BP 95/52 mmHg  Pulse 73  Temp(Src) 98 F (36.7 C)  Resp 20  Wt 164 lb (74.39 kg)  SpO2 100% right arm sitting  BP 95/52 mmHg  Pulse 73  Temp(Src) 98 F (36.7 C)  Resp 20  Wt 164 lb (74.39 kg)  SpO2 100%left arm sitting Wt Readings from Last 3 Encounters:  07/24/15 164 lb (74.39 kg)  07/22/15 160 lb (72.576 kg)  07/17/15 164 lb 14.4 oz (74.798 kg)    PHYSICAL EXAMINATION: weight is 164 lb (74.39 kg). His temperature is 98 F (36.7 C). His blood pressure is 95/52 and his pulse is 73. His respiration is 20 and oxygen saturation is 100%.        ASSESSMENT: The patient is doing satisfactorily with  treatment.  PLAN: We will continue with the patient's radiation treatment as planned. I discussed the patient's pain medication today. He is still having significant shoulder pain with treatment. We discussed that his plan could be change but this would be difficult and likely would not be quite as good of a planned as his current one. He is going to increase his pain medication by 1 more tablet to 3 prior to treatment each day if necessary. He understands not to increase this further without discussing this with me. He is also going to talk to his cardiologist regarding his low blood pressure. He is on several blood pressure medications.

## 2015-07-25 ENCOUNTER — Ambulatory Visit: Payer: No Typology Code available for payment source

## 2015-07-27 ENCOUNTER — Ambulatory Visit
Admission: RE | Admit: 2015-07-27 | Discharge: 2015-07-27 | Disposition: A | Discharge status: Home / Self Care | Payer: No Typology Code available for payment source | Source: Ambulatory Visit | Attending: Radiation Oncology | Admitting: Radiation Oncology

## 2015-07-27 ENCOUNTER — Telehealth: Payer: Self-pay | Admitting: *Deleted

## 2015-07-27 DIAGNOSIS — Z510 Encounter for antineoplastic radiation therapy: Secondary | ICD-10-CM | POA: Diagnosis not present

## 2015-07-27 NOTE — Telephone Encounter (Addendum)
Niece mildred called asking about patient's pain meds, stating MD Lisbeth Renshaw said he could take 3 pain pills , but patient is reluctant with his heart rate at 60, 'I returned the call, at 1010am,  no answer, I went ot the qwaiting area , and found both pateint and nice  Waiting for ard txs, she had called her pharmacist and was told 3 hydrocodone meds wouldn't make his jeart rate up, so she gave those to the pateint.,   10:12 AM

## 2015-07-29 ENCOUNTER — Telehealth: Payer: Self-pay | Admitting: *Deleted

## 2015-07-29 ENCOUNTER — Ambulatory Visit
Admission: RE | Admit: 2015-07-29 | Discharge: 2015-07-29 | Disposition: A | Discharge status: Home / Self Care | Payer: No Typology Code available for payment source | Source: Ambulatory Visit | Attending: Radiation Oncology | Admitting: Radiation Oncology

## 2015-07-29 ENCOUNTER — Encounter: Payer: Self-pay | Admitting: *Deleted

## 2015-07-29 ENCOUNTER — Other Ambulatory Visit: Payer: Self-pay | Admitting: Radiation Oncology

## 2015-07-29 DIAGNOSIS — Z510 Encounter for antineoplastic radiation therapy: Secondary | ICD-10-CM | POA: Diagnosis not present

## 2015-07-29 NOTE — Telephone Encounter (Signed)
Niece [name] called, stating she needs a refill on patient's hydrocodone, he will run out on Friday, he is taking 3 tablets  Before rasd txs okayed by Dr. Lisbeth Renshaw stated niece, she can pick up tomorrow if Md not available to write rx today, will  Let niece know when to pick rx, will inbasket MD and Shona Simpson, our PA, 10:46 AM

## 2015-07-30 ENCOUNTER — Other Ambulatory Visit: Payer: Self-pay | Admitting: Radiation Oncology

## 2015-07-30 ENCOUNTER — Ambulatory Visit
Admission: RE | Admit: 2015-07-30 | Discharge: 2015-07-30 | Disposition: A | Discharge status: Home / Self Care | Payer: Medicare Other | Source: Ambulatory Visit | Attending: Radiation Oncology | Admitting: Radiation Oncology

## 2015-07-30 ENCOUNTER — Ambulatory Visit
Admission: RE | Admit: 2015-07-30 | Discharge: 2015-07-30 | Disposition: A | Discharge status: Home / Self Care | Payer: No Typology Code available for payment source | Source: Ambulatory Visit | Attending: Radiation Oncology | Admitting: Radiation Oncology

## 2015-07-30 ENCOUNTER — Encounter: Payer: Self-pay | Admitting: Radiation Oncology

## 2015-07-30 VITALS — BP 112/64 | HR 93 | Temp 98.0°F | Ht 70.0 in | Wt 162.1 lb

## 2015-07-30 DIAGNOSIS — Z510 Encounter for antineoplastic radiation therapy: Secondary | ICD-10-CM | POA: Diagnosis not present

## 2015-07-30 DIAGNOSIS — C3401 Malignant neoplasm of right main bronchus: Secondary | ICD-10-CM

## 2015-07-30 MED ORDER — HYDROCODONE-ACETAMINOPHEN 5-325 MG PO TABS: 1.0000 | ORAL_TABLET | ORAL | Status: AC | PRN

## 2015-07-30 NOTE — Progress Notes (Signed)
Department of Radiation Oncology  Phone:  [phone] Fax:        [phone]  Weekly Treatment Note    Name: Gabriel Marsh Date: 07/30/2015 MRN: [id number] DOB: 1930-11-18   Diagnosis:     ICD-9-CM ICD-10-CM   1. Cancer of carina of right bronchus (HCC) 162.2 C34.01      Current dose: 26 Gy  Current fraction: 13   MEDICATIONS: Current Outpatient Prescriptions  Medication Sig Dispense Refill  . acetaminophen (TYLENOL) 500 MG tablet Take 500-1,000 mg by mouth every 6 (six) hours as needed for moderate pain.    Marland Kitchen aspirin EC 81 MG tablet Take 81 mg by mouth daily.    Marland Kitchen docusate sodium (COLACE) 100 MG capsule Take 1 capsule (100 mg total) by mouth 2 (two) times daily. 10 capsule 0  . guaiFENesin-codeine 100-10 MG/5ML syrup Take 5-10 mLs by mouth every 4 (four) hours as needed for cough. 240 mL 0  . hydrochlorothiazide (HYDRODIURIL) 25 MG tablet Take 12.5 mg by mouth daily.     Marland Kitchen HYDROcodone-acetaminophen (NORCO/VICODIN) 5-325 MG tablet Take 1-2 tablets by mouth every 4 (four) hours as needed for moderate pain. 60 tablet 0  . ipratropium-albuterol (DUONEB) 0.5-2.5 (3) MG/3ML SOLN Take 3 mLs by nebulization 4 (four) times daily. 360 mL 2  . lidocaine-prilocaine (EMLA) cream Apply 1 application topically as needed. Apply 1/2 tsp to skin over port 1 hour  prior to chemotherapy treatment. Do not rub in. Cover with plastic only 30 g 1  . losartan (COZAAR) 100 MG tablet Take 100 mg by mouth daily.    Marland Kitchen omeprazole (PRILOSEC) 20 MG capsule Take 20 mg by mouth daily.    . polyethylene glycol (MIRALAX / GLYCOLAX) packet Take 17 g by mouth every other day. 14 each 2  . prochlorperazine (COMPAZINE) 10 MG tablet Take 1 tablet (10 mg total) by mouth every 6 (six) hours as needed for nausea or vomiting. 30 tablet 0  . simvastatin (ZOCOR) 10 MG tablet Take 5 mg by mouth every evening.    . Wound Dressings (SONAFINE) Apply 1 application topically daily.    Marland Kitchen amiodarone (PACERONE) 200 MG tablet  Take 1 tablet (200 mg total) by mouth daily. 30 tablet 11  . metoprolol tartrate (LOPRESSOR) 25 MG tablet Take 1 tablet (25 mg total) by mouth 2 (two) times daily. (Patient taking differently: Take 12.5 mg by mouth 2 (two) times daily. Takings 12.5 in the am and in the PM) 60 tablet 1   No current facility-administered medications for this encounter.     ALLERGIES: Nifedipine; Fish allergy; Lisinopril; Penicillins; Statins; and Sulfa antibiotics   LABORATORY DATA:  Lab Results  Component Value Date   WBC 5.4 07/24/2015   HGB 12.9* 07/24/2015   HCT 39.1 07/24/2015   MCV 92.8 07/24/2015   PLT 238 07/24/2015   Lab Results  Component Value Date   NA 134* 07/24/2015   K 4.5 07/24/2015   CL 99* 07/22/2015   CO2 31* 07/24/2015   Lab Results  Component Value Date   ALT 18 07/24/2015   AST 18 07/24/2015   ALKPHOS 73 07/24/2015   BILITOT 0.42 07/24/2015     NARRATIVE: [name] was seen today for weekly treatment management. The chart was checked and the patient's films were reviewed.  Mr. Toney Reil has received 13 fractions to the right lung. He denies any pain. He reports that he has "a little more energy"  Wt Readings from Last 3 Encounters:  07/30/15 162  lb 1.6 oz (73.528 kg)  07/24/15 164 lb (74.39 kg)  07/22/15 160 lb (72.576 kg)    PHYSICAL EXAMINATION: height is '5\' 10"'$  (1.778 m) and weight is 162 lb 1.6 oz (73.528 kg). His temperature is 98 F (36.7 C). His blood pressure is 112/64 and his pulse is 93. His oxygen saturation is 99%.   2 liters of oxygen per minute via nasal cannula. Patient in a wheelchair.  The patient has a mild rash on his back that we will follow.  ASSESSMENT: The patient is doing satisfactorily with treatment.  PLAN: We will continue with the patient's radiation treatment as planned.  ------------------------------------------------  Jodelle Gross, MD, PhD   This document serves as a record of services personally performed by Kyung Rudd,  MD. It was created on his behalf by Darcus Austin, a trained medical scribe. The creation of this record is based on the scribe's personal observations and the provider's statements to them. This document has been checked and approved by the attending provider.

## 2015-07-30 NOTE — Progress Notes (Signed)
Mr. Gabriel Marsh has received 13 fractions to the right lung.   He denies any pain. O2 at 2 liters/min and currently receiving O2 va [place] - 2 liters/min. Travel by W/C.  He reports that he has "little more energy".

## 2015-07-31 ENCOUNTER — Ambulatory Visit: Payer: Non-veteran care

## 2015-07-31 ENCOUNTER — Other Ambulatory Visit (HOSPITAL_BASED_OUTPATIENT_CLINIC_OR_DEPARTMENT_OTHER): Payer: Medicare Other

## 2015-07-31 ENCOUNTER — Other Ambulatory Visit: Payer: Medicare Other

## 2015-07-31 ENCOUNTER — Ambulatory Visit (HOSPITAL_BASED_OUTPATIENT_CLINIC_OR_DEPARTMENT_OTHER): Payer: Medicare Other

## 2015-07-31 ENCOUNTER — Ambulatory Visit
Admission: RE | Admit: 2015-07-31 | Discharge: 2015-07-31 | Disposition: A | Discharge status: Home / Self Care | Payer: No Typology Code available for payment source | Source: Ambulatory Visit | Attending: Radiation Oncology | Admitting: Radiation Oncology

## 2015-07-31 VITALS — BP 103/58 | HR 72 | Temp 97.8°F | Resp 17

## 2015-07-31 DIAGNOSIS — C3401 Malignant neoplasm of right main bronchus: Secondary | ICD-10-CM

## 2015-07-31 DIAGNOSIS — C3431 Malignant neoplasm of lower lobe, right bronchus or lung: Secondary | ICD-10-CM | POA: Diagnosis present

## 2015-07-31 DIAGNOSIS — Z510 Encounter for antineoplastic radiation therapy: Secondary | ICD-10-CM | POA: Diagnosis not present

## 2015-07-31 DIAGNOSIS — Z5111 Encounter for antineoplastic chemotherapy: Secondary | ICD-10-CM | POA: Diagnosis present

## 2015-07-31 LAB — COMPREHENSIVE METABOLIC PANEL
ALT: 20 U/L (ref 0–55)
ANION GAP: 9 meq/L (ref 3–11)
AST: 20 U/L (ref 5–34)
Albumin: 2.8 g/dL — ABNORMAL LOW (ref 3.5–5.0)
Alkaline Phosphatase: 66 U/L (ref 40–150)
BUN: 17.3 mg/dL (ref 7.0–26.0)
CHLORIDE: 99 meq/L (ref 98–109)
CO2: 26 meq/L (ref 22–29)
CREATININE: 0.8 mg/dL (ref 0.7–1.3)
Calcium: 9.2 mg/dL (ref 8.4–10.4)
EGFR: 81 mL/min/{1.73_m2} — ABNORMAL LOW (ref >90)
GLUCOSE: 107 mg/dL (ref 70–140)
Potassium: 4.5 mEq/L (ref 3.5–5.1)
SODIUM: 134 meq/L — AB (ref 136–145)
TOTAL PROTEIN: 6.6 g/dL (ref 6.4–8.3)
Total Bilirubin: 0.51 mg/dL (ref 0.20–1.20)

## 2015-07-31 LAB — CBC WITH DIFFERENTIAL/PLATELET
BASO%: 0.5 % (ref 0.0–2.0)
Basophils Absolute: 0 10*3/uL (ref 0.0–0.1)
EOS%: 0.5 % (ref 0.0–7.0)
Eosinophils Absolute: 0 10*3/uL (ref 0.0–0.5)
HCT: 37.4 % — ABNORMAL LOW (ref 38.4–49.9)
HGB: 12.7 g/dL — ABNORMAL LOW (ref 13.0–17.1)
LYMPH%: 10.9 % — AB (ref 14.0–49.0)
MCH: 31 pg (ref 27.2–33.4)
MCHC: 34 g/dL (ref 32.0–36.0)
MCV: 91.2 fL (ref 79.3–98.0)
MONO#: 0.4 10*3/uL (ref 0.1–0.9)
MONO%: 17.1 % — AB (ref 0.0–14.0)
NEUT%: 71 % (ref 39.0–75.0)
NEUTROS ABS: 1.5 10*3/uL (ref 1.5–6.5)
PLATELETS: 249 10*3/uL (ref 140–400)
RBC: 4.1 10*6/uL — AB (ref 4.20–5.82)
RDW: 14.2 % (ref 11.0–14.6)
WBC: 2.1 10*3/uL — AB (ref 4.0–10.3)
lymph#: 0.2 10*3/uL — ABNORMAL LOW (ref 0.9–3.3)

## 2015-07-31 MED ORDER — SODIUM CHLORIDE 0.9 % IV SOLN
20.0000 mg | Freq: Once | INTRAVENOUS | Status: AC
  Administered 2015-07-31: 20 mg via INTRAVENOUS
  Filled 2015-07-31: qty 2

## 2015-07-31 MED ORDER — SODIUM CHLORIDE 0.9 % IV SOLN
Freq: Once | INTRAVENOUS | Status: AC
  Administered 2015-07-31: 12:00:00 via INTRAVENOUS

## 2015-07-31 MED ORDER — DIPHENHYDRAMINE HCL 50 MG/ML IJ SOLN
50.0000 mg | Freq: Once | INTRAMUSCULAR | Status: AC
  Administered 2015-07-31: 50 mg via INTRAVENOUS

## 2015-07-31 MED ORDER — DIPHENHYDRAMINE HCL 50 MG/ML IJ SOLN
INTRAMUSCULAR | Status: AC
  Filled 2015-07-31: qty 1

## 2015-07-31 MED ORDER — SODIUM CHLORIDE 0.9% FLUSH
10.0000 mL | INTRAVENOUS | Status: DC | PRN
  Administered 2015-07-31: 10 mL
  Filled 2015-07-31: qty 10

## 2015-07-31 MED ORDER — SODIUM CHLORIDE 0.9 % IV SOLN
45.0000 mg/m2 | Freq: Once | INTRAVENOUS | Status: AC
  Administered 2015-07-31: 90 mg via INTRAVENOUS
  Filled 2015-07-31: qty 15

## 2015-07-31 MED ORDER — PALONOSETRON HCL INJECTION 0.25 MG/5ML
INTRAVENOUS | Status: AC
  Filled 2015-07-31: qty 5

## 2015-07-31 MED ORDER — HEPARIN SOD (PORK) LOCK FLUSH 100 UNIT/ML IV SOLN
500.0000 [IU] | Freq: Once | INTRAVENOUS | Status: AC | PRN
  Administered 2015-07-31: 500 [IU]
  Filled 2015-07-31: qty 5

## 2015-07-31 MED ORDER — FAMOTIDINE IN NACL 20-0.9 MG/50ML-% IV SOLN
20.0000 mg | Freq: Once | INTRAVENOUS | Status: AC
  Administered 2015-07-31: 20 mg via INTRAVENOUS

## 2015-07-31 MED ORDER — PALONOSETRON HCL INJECTION 0.25 MG/5ML
0.2500 mg | Freq: Once | INTRAVENOUS | Status: AC
  Administered 2015-07-31: 0.25 mg via INTRAVENOUS

## 2015-07-31 MED ORDER — SODIUM CHLORIDE 0.9 % IV SOLN
177.2000 mg | Freq: Once | INTRAVENOUS | Status: AC
  Administered 2015-07-31: 180 mg via INTRAVENOUS
  Filled 2015-07-31: qty 18

## 2015-07-31 MED ORDER — FAMOTIDINE IN NACL 20-0.9 MG/50ML-% IV SOLN
INTRAVENOUS | Status: AC
  Filled 2015-07-31: qty 50

## 2015-07-31 MED FILL — HYDROCODON-APAP 5-325: 5-325 | 5 days supply | Qty: 60 | Fill #0

## 2015-07-31 NOTE — Patient Instructions (Signed)
[facility] Discharge Instructions for Patients Receiving Chemotherapy  Today you received the following chemotherapy agents :  Taxol,  Carboplatin.  To help prevent nausea and vomiting after your treatment, we encourage you to take your nausea medication as prescribed.   If you develop nausea and vomiting that is not controlled by your nausea medication, call the clinic.   BELOW ARE SYMPTOMS THAT SHOULD BE REPORTED IMMEDIATELY:  *FEVER GREATER THAN 100.5 F  *CHILLS WITH OR WITHOUT FEVER  NAUSEA AND VOMITING THAT IS NOT CONTROLLED WITH YOUR NAUSEA MEDICATION  *UNUSUAL SHORTNESS OF BREATH  *UNUSUAL BRUISING OR BLEEDING  TENDERNESS IN MOUTH AND THROAT WITH OR WITHOUT PRESENCE OF ULCERS  *URINARY PROBLEMS  *BOWEL PROBLEMS  UNUSUAL RASH Items with * indicate a potential emergency and should be followed up as soon as possible.  Feel free to call the clinic you have any questions or concerns. The clinic phone number is [phone].  Please show the CHEMO ALERT CARD at check-in to the Emergency Department and triage nurse.   

## 2015-08-03 ENCOUNTER — Ambulatory Visit
Admission: RE | Admit: 2015-08-03 | Discharge: 2015-08-03 | Disposition: A | Discharge status: Home / Self Care | Payer: No Typology Code available for payment source | Source: Ambulatory Visit | Attending: Radiation Oncology | Admitting: Radiation Oncology

## 2015-08-03 ENCOUNTER — Other Ambulatory Visit: Payer: Self-pay | Admitting: Oncology

## 2015-08-03 DIAGNOSIS — Z510 Encounter for antineoplastic radiation therapy: Secondary | ICD-10-CM | POA: Diagnosis not present

## 2015-08-03 MED FILL — PROCHLORPERAZINE 10 MG TAB: 10 | 7 days supply | Qty: 30 | Fill #0

## 2015-08-04 ENCOUNTER — Ambulatory Visit
Admission: RE | Admit: 2015-08-04 | Discharge: 2015-08-04 | Disposition: A | Discharge status: Home / Self Care | Payer: No Typology Code available for payment source | Source: Ambulatory Visit | Attending: Radiation Oncology | Admitting: Radiation Oncology

## 2015-08-04 ENCOUNTER — Telehealth: Payer: Self-pay | Admitting: *Deleted

## 2015-08-04 DIAGNOSIS — Z510 Encounter for antineoplastic radiation therapy: Secondary | ICD-10-CM | POA: Diagnosis not present

## 2015-08-04 NOTE — Telephone Encounter (Signed)
Returned call to niece [name],  She is concerned  About patient's  No energy and wobbly, but it is probably form the pain medication he is getting 3 pain pills  At a time  To lay on rad table, asking for another tube sonafine cream getting, low, [facility] is there now assessing patient, he has a walker but not using, per his niece she has asked him to start using but he doesn't like to be told what to do , he lives by himself, stated [place], he is used to not having anyone help him,  I informed  Her she could say I advised to start using his walker  As long as he is wobbly and taking pain medication,She just wanted some advice and thanked me for listening  1:08 PM

## 2015-08-05 ENCOUNTER — Ambulatory Visit
Admission: RE | Admit: 2015-08-05 | Discharge: 2015-08-05 | Disposition: A | Discharge status: Home / Self Care | Payer: No Typology Code available for payment source | Source: Ambulatory Visit | Attending: Radiation Oncology | Admitting: Radiation Oncology

## 2015-08-05 DIAGNOSIS — Z510 Encounter for antineoplastic radiation therapy: Secondary | ICD-10-CM | POA: Diagnosis not present

## 2015-08-06 ENCOUNTER — Ambulatory Visit
Admission: RE | Admit: 2015-08-06 | Discharge: 2015-08-06 | Disposition: A | Discharge status: Home / Self Care | Payer: No Typology Code available for payment source | Source: Ambulatory Visit | Attending: Radiation Oncology | Admitting: Radiation Oncology

## 2015-08-06 ENCOUNTER — Encounter: Payer: Self-pay | Admitting: Radiation Oncology

## 2015-08-06 ENCOUNTER — Ambulatory Visit
Admission: RE | Admit: 2015-08-06 | Discharge: 2015-08-06 | Disposition: A | Discharge status: Home / Self Care | Payer: Non-veteran care | Source: Ambulatory Visit | Attending: Radiation Oncology | Admitting: Radiation Oncology

## 2015-08-06 VITALS — BP 103/59 | HR 84 | Temp 97.8°F | Resp 20

## 2015-08-06 DIAGNOSIS — C3401 Malignant neoplasm of right main bronchus: Secondary | ICD-10-CM

## 2015-08-06 DIAGNOSIS — Z510 Encounter for antineoplastic radiation therapy: Secondary | ICD-10-CM | POA: Diagnosis not present

## 2015-08-06 MED ORDER — SUCRALFATE 1 G PO TABS: 1.0000 g | ORAL_TABLET | Freq: Four times a day (QID) | ORAL | Status: AC

## 2015-08-06 MED ORDER — NYSTATIN 100000 UNIT/ML MT SUSP: 5.0000 mL | Freq: Four times a day (QID) | OROMUCOSAL | Status: AC

## 2015-08-06 MED FILL — NYSTATIN 100,000 UNITS/ML S: 100000 | 12 days supply | Qty: 240 | Fill #0

## 2015-08-06 MED FILL — SUCRALFATE 1 GM TABLET: 1 | 30 days supply | Qty: 120 | Fill #0

## 2015-08-06 NOTE — Progress Notes (Signed)
weekly rad txs right lung  18/ 33 completed, on oxygen 2 liters n/c, in w/c, weakness , coughing loose congestive cough, coughs up thick phelgm in am,  Some difficulty swallowing foods now, drinks ensure enlive, hasn't eaten food 2 days,  Tongue is coated yellow, thrush? No taste in foods, using sonafione cream daily on chest and on back, rash eythrma  Skin intact,   1:15 PM   Wt Readings from Last 3 Encounters:  07/30/15 162 lb 1.6 oz (73.528 kg)  07/24/15 164 lb (74.39 kg)  07/22/15 160 lb (72.576 kg)

## 2015-08-06 NOTE — Progress Notes (Signed)
Department of Radiation Oncology  Phone:  [phone] Fax:        [phone]  Weekly Treatment Note    Name: Gabriel Marsh Date: 08/06/2015 MRN: [id number] DOB: 11-Jun-1930   Diagnosis:     ICD-9-CM ICD-10-CM   1. Cancer of carina of right bronchus (HCC) 162.2 C34.01      Current dose: 36 Gy  Current fraction: 18   MEDICATIONS: Current Outpatient Prescriptions  Medication Sig Dispense Refill  . acetaminophen (TYLENOL) 500 MG tablet Take 500-1,000 mg by mouth every 6 (six) hours as needed for moderate pain.    Marland Kitchen aspirin EC 81 MG tablet Take 81 mg by mouth daily.    Marland Kitchen docusate sodium (COLACE) 100 MG capsule Take 1 capsule (100 mg total) by mouth 2 (two) times daily. 10 capsule 0  . guaiFENesin-codeine 100-10 MG/5ML syrup Take 5-10 mLs by mouth every 4 (four) hours as needed for cough. 240 mL 0  . hydrochlorothiazide (HYDRODIURIL) 25 MG tablet Take 12.5 mg by mouth daily.     Marland Kitchen HYDROcodone-acetaminophen (NORCO/VICODIN) 5-325 MG tablet Take 1-2 tablets by mouth every 4 (four) hours as needed for moderate pain. 60 tablet 0  . ipratropium-albuterol (DUONEB) 0.5-2.5 (3) MG/3ML SOLN Take 3 mLs by nebulization 4 (four) times daily. 360 mL 2  . lidocaine-prilocaine (EMLA) cream Apply 1 application topically as needed. Apply 1/2 tsp to skin over port 1 hour  prior to chemotherapy treatment. Do not rub in. Cover with plastic only 30 g 1  . losartan (COZAAR) 100 MG tablet Take 100 mg by mouth daily.    Marland Kitchen omeprazole (PRILOSEC) 20 MG capsule Take 20 mg by mouth daily.    . polyethylene glycol (MIRALAX / GLYCOLAX) packet Take 17 g by mouth every other day. 14 each 2  . prochlorperazine (COMPAZINE) 10 MG tablet TAKE 1 TABLET BY MOUTH EVERY 6 HOURS AS NEEDED FOR NAUSEA OR VOMITING. 30 tablet 0  . simvastatin (ZOCOR) 10 MG tablet Take 5 mg by mouth every evening.    . Wound Dressings (SONAFINE) Apply 1 application topically daily.    Marland Kitchen amiodarone (PACERONE) 200 MG tablet Take 1 tablet (200  mg total) by mouth daily. 30 tablet 11  . metoprolol tartrate (LOPRESSOR) 25 MG tablet Take 1 tablet (25 mg total) by mouth 2 (two) times daily. (Patient taking differently: Take 12.5 mg by mouth 2 (two) times daily. Takings 12.5 in the am and in the PM) 60 tablet 1  . nystatin (MYCOSTATIN) 100000 UNIT/ML suspension Take 5 mLs (500,000 Units total) by mouth 4 (four) times daily. Swish/spit solution. 240 mL 0  . sucralfate (CARAFATE) 1 g tablet Take 1 tablet (1 g total) by mouth 4 (four) times daily. 120 tablet 2   No current facility-administered medications for this encounter.     ALLERGIES: Nifedipine; Fish allergy; Lisinopril; Penicillins; Statins; and Sulfa antibiotics   LABORATORY DATA:  Lab Results  Component Value Date   WBC 2.1* 07/31/2015   HGB 12.7* 07/31/2015   HCT 37.4* 07/31/2015   MCV 91.2 07/31/2015   PLT 249 07/31/2015   Lab Results  Component Value Date   NA 134* 07/31/2015   K 4.5 07/31/2015   CL 99* 07/22/2015   CO2 26 07/31/2015   Lab Results  Component Value Date   ALT 20 07/31/2015   AST 20 07/31/2015   ALKPHOS 66 07/31/2015   BILITOT 0.51 07/31/2015     NARRATIVE: Gabriel Marsh was seen today for weekly treatment management. The chart  was checked and the patient's films were reviewed.  Weekly radiation treatments to the right lung. On oxygen 2 liters via nasal cannula. Presents in wheelchair. Reports weakness and some difficulty swallowing foods now. Drinks ensure enlive. She has not eaten food in 2 days. She has no taste in foods. Reports loose congestive cough. Using sonafine cream daily on chest and back. His wife notes that his cough has been worse today than it has been for the last several days. He has cough syrup that has codeine in it but he does not like to take it. His throat does not feel as though it is reflux but rather a sore throat. He has been burping and coughing up "blobs of yellow". His wife asks if he should start taking omeprazole in  the mornings. He has biotene to brush on his tongue and gums. Reports he is sleeping more frequently, for example he will sleep for three hours and get up for about two hours, then go back to sleep. Wife notes physical therapy came for an evaluation the day before yesterday and will call back to set up appointments to begin routine physical therapy.   PHYSICAL EXAMINATION: oral temperature is 97.8 F (36.6 C). His blood pressure is 103/59 and his pulse is 84. His respiration is 20 and oxygen saturation is 96%.      Presents in wheelchair. Alert and in no acute distress. Coating present on tongue consistent with thrush.   ASSESSMENT: The patient is doing satisfactorily with treatment.  PLAN: We will continue with the patient's radiation treatment as planned. I have written the patient a prescription for nystatin and carafate to resolve current symptoms.    ------------------------------------------------  Jodelle Gross, MD, PhD  This document serves as a record of services personally performed by Gabriel Rudd, MD. It was created on his behalf by Arlyce Harman, a trained medical scribe. The creation of this record is based on the scribe's personal observations and the provider's statements to them. This document has been checked and approved by the attending provider.

## 2015-08-07 ENCOUNTER — Telehealth: Payer: Self-pay | Admitting: *Deleted

## 2015-08-07 ENCOUNTER — Ambulatory Visit (HOSPITAL_BASED_OUTPATIENT_CLINIC_OR_DEPARTMENT_OTHER): Payer: Medicare Other

## 2015-08-07 ENCOUNTER — Other Ambulatory Visit (HOSPITAL_BASED_OUTPATIENT_CLINIC_OR_DEPARTMENT_OTHER): Payer: Medicare Other

## 2015-08-07 ENCOUNTER — Other Ambulatory Visit: Payer: Medicare Other

## 2015-08-07 ENCOUNTER — Ambulatory Visit: Payer: Medicare Other

## 2015-08-07 ENCOUNTER — Other Ambulatory Visit: Payer: Self-pay | Admitting: Radiation Oncology

## 2015-08-07 ENCOUNTER — Ambulatory Visit (HOSPITAL_BASED_OUTPATIENT_CLINIC_OR_DEPARTMENT_OTHER): Payer: Medicare Other | Admitting: Oncology

## 2015-08-07 ENCOUNTER — Telehealth: Payer: Self-pay | Admitting: Oncology

## 2015-08-07 ENCOUNTER — Ambulatory Visit
Admission: RE | Admit: 2015-08-07 | Discharge: 2015-08-07 | Disposition: A | Discharge status: Home / Self Care | Payer: No Typology Code available for payment source | Source: Ambulatory Visit | Attending: Radiation Oncology | Admitting: Radiation Oncology

## 2015-08-07 VITALS — BP 95/54 | HR 83 | Temp 97.8°F | Resp 16 | Ht 70.0 in | Wt 156.3 lb

## 2015-08-07 DIAGNOSIS — C3401 Malignant neoplasm of right main bronchus: Secondary | ICD-10-CM

## 2015-08-07 DIAGNOSIS — Z510 Encounter for antineoplastic radiation therapy: Secondary | ICD-10-CM | POA: Diagnosis not present

## 2015-08-07 DIAGNOSIS — C3431 Malignant neoplasm of lower lobe, right bronchus or lung: Secondary | ICD-10-CM

## 2015-08-07 DIAGNOSIS — Z95828 Presence of other vascular implants and grafts: Secondary | ICD-10-CM

## 2015-08-07 DIAGNOSIS — C3490 Malignant neoplasm of unspecified part of unspecified bronchus or lung: Secondary | ICD-10-CM

## 2015-08-07 DIAGNOSIS — Z5111 Encounter for antineoplastic chemotherapy: Secondary | ICD-10-CM | POA: Diagnosis present

## 2015-08-07 DIAGNOSIS — R52 Pain, unspecified: Secondary | ICD-10-CM

## 2015-08-07 LAB — COMPREHENSIVE METABOLIC PANEL
ALBUMIN: 2.4 g/dL — AB (ref 3.5–5.0)
ALK PHOS: 62 U/L (ref 40–150)
ALT: 31 U/L (ref 0–55)
ANION GAP: 10 meq/L (ref 3–11)
AST: 24 U/L (ref 5–34)
BILIRUBIN TOTAL: 0.62 mg/dL (ref 0.20–1.20)
BUN: 44.4 mg/dL — ABNORMAL HIGH (ref 7.0–26.0)
CALCIUM: 9.3 mg/dL (ref 8.4–10.4)
CO2: 28 mEq/L (ref 22–29)
CREATININE: 0.9 mg/dL (ref 0.7–1.3)
Chloride: 97 mEq/L — ABNORMAL LOW (ref 98–109)
EGFR: 79 mL/min/{1.73_m2} — AB (ref >90)
Glucose: 140 mg/dl (ref 70–140)
Potassium: 3.9 mEq/L (ref 3.5–5.1)
Sodium: 136 mEq/L (ref 136–145)
TOTAL PROTEIN: 6.4 g/dL (ref 6.4–8.3)

## 2015-08-07 LAB — CBC WITH DIFFERENTIAL/PLATELET
BASO%: 0.3 % (ref 0.0–2.0)
Basophils Absolute: 0 10*3/uL (ref 0.0–0.1)
EOS ABS: 0 10*3/uL (ref 0.0–0.5)
EOS%: 0 % (ref 0.0–7.0)
HEMATOCRIT: 33.3 % — AB (ref 38.4–49.9)
HGB: 11.4 g/dL — ABNORMAL LOW (ref 13.0–17.1)
LYMPH#: 0.2 10*3/uL — AB (ref 0.9–3.3)
LYMPH%: 5.5 % — AB (ref 14.0–49.0)
MCH: 31.3 pg (ref 27.2–33.4)
MCHC: 34.2 g/dL (ref 32.0–36.0)
MCV: 91.5 fL (ref 79.3–98.0)
MONO#: 0.5 10*3/uL (ref 0.1–0.9)
MONO%: 14.9 % — ABNORMAL HIGH (ref 0.0–14.0)
NEUT%: 79.3 % — AB (ref 39.0–75.0)
NEUTROS ABS: 2.5 10*3/uL (ref 1.5–6.5)
PLATELETS: 214 10*3/uL (ref 140–400)
RBC: 3.64 10*6/uL — ABNORMAL LOW (ref 4.20–5.82)
RDW: 14.6 % (ref 11.0–14.6)
WBC: 3.1 10*3/uL — AB (ref 4.0–10.3)

## 2015-08-07 MED ORDER — FAMOTIDINE IN NACL 20-0.9 MG/50ML-% IV SOLN
INTRAVENOUS | Status: AC
  Filled 2015-08-07: qty 50

## 2015-08-07 MED ORDER — SODIUM CHLORIDE 0.9% FLUSH
10.0000 mL | INTRAVENOUS | Status: DC | PRN
  Administered 2015-08-07: 10 mL via INTRAVENOUS
  Filled 2015-08-07: qty 10

## 2015-08-07 MED ORDER — DIPHENHYDRAMINE HCL 50 MG/ML IJ SOLN
INTRAMUSCULAR | Status: AC
Start: 2015-08-07 — End: 2015-08-07
  Filled 2015-08-07: qty 1

## 2015-08-07 MED ORDER — HEPARIN SOD (PORK) LOCK FLUSH 100 UNIT/ML IV SOLN
500.0000 [IU] | Freq: Once | INTRAVENOUS | Status: AC | PRN
  Administered 2015-08-07: 500 [IU]
  Filled 2015-08-07: qty 5

## 2015-08-07 MED ORDER — FAMOTIDINE IN NACL 20-0.9 MG/50ML-% IV SOLN
20.0000 mg | Freq: Once | INTRAVENOUS | Status: AC
  Administered 2015-08-07: 20 mg via INTRAVENOUS

## 2015-08-07 MED ORDER — SODIUM CHLORIDE 0.9 % IV SOLN
177.2000 mg | Freq: Once | INTRAVENOUS | Status: AC
  Administered 2015-08-07: 180 mg via INTRAVENOUS
  Filled 2015-08-07: qty 18

## 2015-08-07 MED ORDER — DIPHENHYDRAMINE HCL 50 MG/ML IJ SOLN
50.0000 mg | Freq: Once | INTRAMUSCULAR | Status: AC
  Administered 2015-08-07: 50 mg via INTRAVENOUS

## 2015-08-07 MED ORDER — PACLITAXEL CHEMO INJECTION 300 MG/50ML
45.0000 mg/m2 | Freq: Once | INTRAVENOUS | Status: AC
  Administered 2015-08-07: 90 mg via INTRAVENOUS
  Filled 2015-08-07: qty 15

## 2015-08-07 MED ORDER — SODIUM CHLORIDE 0.9% FLUSH
10.0000 mL | INTRAVENOUS | Status: DC | PRN
  Administered 2015-08-07: 10 mL
  Filled 2015-08-07: qty 10

## 2015-08-07 MED ORDER — SODIUM CHLORIDE 0.9 % IV SOLN
20.0000 mg | Freq: Once | INTRAVENOUS | Status: AC
  Administered 2015-08-07: 20 mg via INTRAVENOUS
  Filled 2015-08-07: qty 2

## 2015-08-07 MED ORDER — PALONOSETRON HCL INJECTION 0.25 MG/5ML
0.2500 mg | Freq: Once | INTRAVENOUS | Status: AC
  Administered 2015-08-07: 0.25 mg via INTRAVENOUS

## 2015-08-07 MED ORDER — PALONOSETRON HCL INJECTION 0.25 MG/5ML
INTRAVENOUS | Status: AC
  Filled 2015-08-07: qty 5

## 2015-08-07 MED ORDER — SODIUM CHLORIDE 0.9 % IV SOLN
Freq: Once | INTRAVENOUS | Status: AC
  Administered 2015-08-07: 12:00:00 via INTRAVENOUS

## 2015-08-07 NOTE — Telephone Encounter (Signed)
Done orders in

## 2015-08-07 NOTE — Telephone Encounter (Signed)
Called Gabriel Marsh [phone], left vm, that orders for PT [facility] has been faxed , 1:17 PM

## 2015-08-07 NOTE — Telephone Encounter (Signed)
error 

## 2015-08-07 NOTE — Telephone Encounter (Signed)
per pof to sch pt appt-gave pt copy of avs °

## 2015-08-07 NOTE — Patient Instructions (Addendum)

## 2015-08-07 NOTE — Telephone Encounter (Signed)
Rosalie Doctor Physical Therapist  Called and left voice message  requesting  Home health Physical therapy  From [facility]  For 6 weeks  Twice daily for transfer of balance training, , ambulation, safety in home,will route ot Dr. Lisbeth Renshaw and call him back after speaking with MD at [phone] 10:00 AM

## 2015-08-07 NOTE — Telephone Encounter (Signed)
Patient has [facility], when I called Rosalie Doctor, Physical therapist back, his voice mail satted [facility].left vm to call me To clarify so MD can write orders for physical therapy  10:54 AM\

## 2015-08-07 NOTE — Progress Notes (Signed)
Hematology and Oncology Follow Up Visit  Gabriel Marsh [id number] 11/29/1930 82 y.o. 08/07/2015 11:28 AM BALE,RENU, MDBale, Renu, MD   Principle Diagnosis: 82 year old gentleman with squamous cell carcinoma of the right lung presented with a mass measuring 3.9 x 3.5 cm and the clinical staging is stage IIIA disease. This was diagnosed in June 2017.   Prior Therapy: Status post bronchoscopy done on 07/09/2015 confirmed the presence of squamous cell carcinoma.  Current therapy: Definitive radiation therapy concomitantly with carboplatin and Taxol given on a weekly basis. Week one is starting on 07/17/2015. He is here for week 4 of therapy.  Interim History: Gabriel Marsh presents today for a follow-up visit. Since the last visit, he continues to tolerate therapy reasonably well. He denied any complications related to chemotherapy. He did not report any nausea or vomiting. He did have one episode of diarrhea after cycle 1 of therapy which have resolved He is still requiring 2 L of oxygen and does not report any dyspnea at rest. He does report dyspnea on exertion. Does not report any cough or hemoptysis. He is reporting dysphagia related to radiation therapy and currently using ensure to maintain his nutritional status..  Denies any headaches or blurry vision. He denied any peripheral neuropathy or alteration of mental status. He does not report any fevers, chills, sweats and appetite is improving. Does not report any palpitation, orthopnea or leg edema. Does not report any wheezing. Does not report any nausea, vomiting, abdominal pain, hematochezia or melena. He does not report any frequency urgency or hesitancy. As that report any skeletal complaints of arthralgias or myalgias. Remaining review of systems unremarkable.   Medications: I have reviewed the patient's current medications.  Current Outpatient Prescriptions  Medication Sig Dispense Refill  . acetaminophen (TYLENOL) 500 MG tablet Take  500-1,000 mg by mouth every 6 (six) hours as needed for moderate pain.    Marland Kitchen aspirin EC 81 MG tablet Take 81 mg by mouth daily.    Marland Kitchen docusate sodium (COLACE) 100 MG capsule Take 1 capsule (100 mg total) by mouth 2 (two) times daily. 10 capsule 0  . guaiFENesin-codeine 100-10 MG/5ML syrup Take 5-10 mLs by mouth every 4 (four) hours as needed for cough. 240 mL 0  . hydrochlorothiazide (HYDRODIURIL) 25 MG tablet Take 12.5 mg by mouth daily.     Marland Kitchen HYDROcodone-acetaminophen (NORCO/VICODIN) 5-325 MG tablet Take 1-2 tablets by mouth every 4 (four) hours as needed for moderate pain. 60 tablet 0  . ipratropium-albuterol (DUONEB) 0.5-2.5 (3) MG/3ML SOLN Take 3 mLs by nebulization 4 (four) times daily. 360 mL 2  . lidocaine-prilocaine (EMLA) cream Apply 1 application topically as needed. Apply 1/2 tsp to skin over port 1 hour  prior to chemotherapy treatment. Do not rub in. Cover with plastic only 30 g 1  . losartan (COZAAR) 100 MG tablet Take 100 mg by mouth daily.    Marland Kitchen nystatin (MYCOSTATIN) 100000 UNIT/ML suspension Take 5 mLs (500,000 Units total) by mouth 4 (four) times daily. Swish/spit solution. 240 mL 0  . omeprazole (PRILOSEC) 20 MG capsule Take 20 mg by mouth daily.    . polyethylene glycol (MIRALAX / GLYCOLAX) packet Take 17 g by mouth every other day. 14 each 2  . prochlorperazine (COMPAZINE) 10 MG tablet TAKE 1 TABLET BY MOUTH EVERY 6 HOURS AS NEEDED FOR NAUSEA OR VOMITING. 30 tablet 0  . simvastatin (ZOCOR) 10 MG tablet Take 5 mg by mouth every evening.    . sucralfate (CARAFATE) 1 g tablet Take  1 tablet (1 g total) by mouth 4 (four) times daily. 120 tablet 2  . Wound Dressings (SONAFINE) Apply 1 application topically daily.    Marland Kitchen amiodarone (PACERONE) 200 MG tablet Take 1 tablet (200 mg total) by mouth daily. 30 tablet 11  . metoprolol tartrate (LOPRESSOR) 25 MG tablet Take 1 tablet (25 mg total) by mouth 2 (two) times daily. (Patient taking differently: Take 12.5 mg by mouth 2 (two) times daily.  Takings 12.5 in the am and in the PM) 60 tablet 1   No current facility-administered medications for this visit.     Allergies:  Allergies  Allergen Reactions  . Nifedipine Swelling  . Penicillins Hives    Has patient had a PCN reaction causing immediate rash, facial/tongue/throat swelling, SOB or lightheadedness with hypotension: Yes Has patient had a PCN reaction causing severe rash involving mucus membranes or skin necrosis: Yes Has patient had a PCN reaction that required hospitalization Unknown Has patient had a PCN reaction occurring within the last 10 years: No If all of the above answers are "NO", then may proceed with Cephalosporin use.   . Statins Other (See Comments)    Leg cramps  . Sulfa Antibiotics     Hives   . Fish Allergy Nausea And Vomiting  . Lisinopril Cough    Past Medical History, Surgical history, Social history, and Family History were reviewed and updated.   Physical Exam: Blood pressure 95/54, pulse 83, temperature 97.8 F (36.6 C), temperature source Oral, resp. rate 16, height '5\' 10"'$  (1.778 m), weight 156 lb 4.8 oz (70.897 kg), SpO2 96 %.   ECOG:  2 General appearance: Chronically ill-appearing gentleman without distress. He is in a wheelchair. Head: Normocephalic, without obvious abnormality no oral ulcers or lesions. Neck: no adenopathy Lymph nodes: Cervical, supraclavicular, and axillary nodes normal. Heart:regular rate and rhythm, S1, S2 normal, no murmur, click, rub or gallop Lung:chest clear, no wheezing, rales, normal symmetric air entry. Decrease breath sounds at the bases. Abdomin: soft, non-tender, without masses or organomegaly EXT:no erythema, induration, or nodules   Lab Results: Lab Results  Component Value Date   WBC 3.1* 08/07/2015   HGB 11.4* 08/07/2015   HCT 33.3* 08/07/2015   MCV 91.5 08/07/2015   PLT 214 08/07/2015     Chemistry      Component Value Date/Time   NA 136 08/07/2015 1014   NA 134* 07/22/2015 1318    K 3.9 08/07/2015 1014   K 4.3 07/22/2015 1318   CL 99* 07/22/2015 1318   CO2 28 08/07/2015 1014   CO2 27 07/22/2015 1318   BUN 44.4* 08/07/2015 1014   BUN 25* 07/22/2015 1318   CREATININE 0.9 08/07/2015 1014   CREATININE 0.77 07/22/2015 1318      Component Value Date/Time   CALCIUM 9.3 08/07/2015 1014   CALCIUM 9.1 07/22/2015 1318   ALKPHOS 62 08/07/2015 1014   ALKPHOS 76 07/09/2015 2132   AST 24 08/07/2015 1014   AST 21 07/09/2015 2132   ALT 31 08/07/2015 1014   ALT 15* 07/09/2015 2132   BILITOT 0.62 08/07/2015 1014   BILITOT 0.9 07/09/2015 2132       Impression and Plan:   82 year old gentleman with the following issues:  1. Squamous cell carcinoma of the right lung presented with a large mass measuring 3.9 x 3.5 cm with invasion into the bronchus intermedius and infiltrating into the mediastinum. No evidence of lymphadenopathy noted on the PET/CT scan or distant metastasis. He is status post a  bronchoscopy obtained on 07/09/2015 and the preliminary report support the finding of a squamous cell carcinoma. Clinical staging indicated IIIA disease.  He is receiving definitive radiation therapy With weekly carboplatin and Taxol. He continues to tolerated this therapy reasonably well without any major complications related to chemotherapy. The plan is to proceed with therapy today and on a weekly basis. His last cycle of therapy is tentatively scheduled for 08/21/2015. His last radiation therapy is scheduled on 08/27/2015.  2. IV access: Port-A-Cath inserted without complications. No issues with its use at this time.  3. Antiemetics: Compazine is available to the patient. Appears to be effective at this time.  4. Respiratory distress: Seems to be improving with definitive therapy for his lung cancer. He continues to be on oxygen therapy with his oxygen demand reasonably stable.  5. Pain: Continues to be reasonably controlled with limited pain medication.  6. Poor nutrition:  Continue to encourage him to use nutritional supplements which she is using 3 times a day.  7. Weakness: He is a good candidate for physical therapy to prevent further decline and debilitation.  8. Follow-up: Will be on a weekly basis for his chemotherapy.  [facility], MD 7/14/201711:28 AM

## 2015-08-10 ENCOUNTER — Telehealth: Payer: Self-pay | Admitting: *Deleted

## 2015-08-10 ENCOUNTER — Encounter: Payer: Self-pay | Admitting: *Deleted

## 2015-08-10 ENCOUNTER — Ambulatory Visit (HOSPITAL_BASED_OUTPATIENT_CLINIC_OR_DEPARTMENT_OTHER): Payer: Medicare Other

## 2015-08-10 ENCOUNTER — Ambulatory Visit (HOSPITAL_BASED_OUTPATIENT_CLINIC_OR_DEPARTMENT_OTHER): Payer: Medicare Other | Admitting: Nurse Practitioner

## 2015-08-10 ENCOUNTER — Telehealth: Payer: Self-pay

## 2015-08-10 ENCOUNTER — Ambulatory Visit
Admission: RE | Admit: 2015-08-10 | Discharge: 2015-08-10 | Disposition: A | Discharge status: Home / Self Care | Payer: No Typology Code available for payment source | Source: Ambulatory Visit | Attending: Radiation Oncology | Admitting: Radiation Oncology

## 2015-08-10 VITALS — BP 106/59 | HR 90 | Temp 98.2°F | Resp 18

## 2015-08-10 DIAGNOSIS — C3401 Malignant neoplasm of right main bronchus: Secondary | ICD-10-CM

## 2015-08-10 DIAGNOSIS — Z510 Encounter for antineoplastic radiation therapy: Secondary | ICD-10-CM | POA: Diagnosis not present

## 2015-08-10 DIAGNOSIS — I951 Orthostatic hypotension: Secondary | ICD-10-CM

## 2015-08-10 DIAGNOSIS — C3431 Malignant neoplasm of lower lobe, right bronchus or lung: Secondary | ICD-10-CM

## 2015-08-10 DIAGNOSIS — R860 Abnormal level of enzymes in specimens from male genital organs: Secondary | ICD-10-CM | POA: Diagnosis not present

## 2015-08-10 DIAGNOSIS — E860 Dehydration: Secondary | ICD-10-CM

## 2015-08-10 LAB — COMPREHENSIVE METABOLIC PANEL
ALBUMIN: 2.5 g/dL — AB (ref 3.5–5.0)
ALK PHOS: 64 U/L (ref 40–150)
ALT: 38 U/L (ref 0–55)
AST: 23 U/L (ref 5–34)
Anion Gap: 10 mEq/L (ref 3–11)
BUN: 55.9 mg/dL — ABNORMAL HIGH (ref 7.0–26.0)
CHLORIDE: 98 meq/L (ref 98–109)
CO2: 27 mEq/L (ref 22–29)
Calcium: 8.9 mg/dL (ref 8.4–10.4)
Creatinine: 1.1 mg/dL (ref 0.7–1.3)
EGFR: 61 mL/min/{1.73_m2} — AB (ref >90)
GLUCOSE: 107 mg/dL (ref 70–140)
POTASSIUM: 4.3 meq/L (ref 3.5–5.1)
SODIUM: 135 meq/L — AB (ref 136–145)
Total Bilirubin: 0.56 mg/dL (ref 0.20–1.20)
Total Protein: 6.3 g/dL — ABNORMAL LOW (ref 6.4–8.3)

## 2015-08-10 LAB — CBC WITH DIFFERENTIAL/PLATELET
BASO%: 0.3 % (ref 0.0–2.0)
BASOS ABS: 0 10*3/uL (ref 0.0–0.1)
EOS%: 0.1 % (ref 0.0–7.0)
Eosinophils Absolute: 0 10*3/uL (ref 0.0–0.5)
HCT: 34 % — ABNORMAL LOW (ref 38.4–49.9)
HEMOGLOBIN: 11.4 g/dL — AB (ref 13.0–17.1)
LYMPH#: 0.1 10*3/uL — AB (ref 0.9–3.3)
LYMPH%: 8.6 % — ABNORMAL LOW (ref 14.0–49.0)
MCH: 30.8 pg (ref 27.2–33.4)
MCHC: 33.6 g/dL (ref 32.0–36.0)
MCV: 91.6 fL (ref 79.3–98.0)
MONO#: 0.1 10*3/uL (ref 0.1–0.9)
MONO%: 4.2 % (ref 0.0–14.0)
NEUT#: 1.4 10*3/uL — ABNORMAL LOW (ref 1.5–6.5)
NEUT%: 86.8 % — ABNORMAL HIGH (ref 39.0–75.0)
Platelets: 187 10*3/uL (ref 140–400)
RBC: 3.71 10*6/uL — AB (ref 4.20–5.82)
RDW: 14.8 % — AB (ref 11.0–14.6)
WBC: 1.6 10*3/uL — AB (ref 4.0–10.3)

## 2015-08-10 MED ORDER — SODIUM CHLORIDE 0.9% FLUSH
10.0000 mL | INTRAVENOUS | Status: DC | PRN
Start: 2015-08-10 — End: 2015-08-10
  Administered 2015-08-10: 10 mL via INTRAVENOUS
  Filled 2015-08-10: qty 10

## 2015-08-10 MED ORDER — SODIUM CHLORIDE 0.9 % IV SOLN: INTRAVENOUS | Status: DC

## 2015-08-10 MED ORDER — HEPARIN SOD (PORK) LOCK FLUSH 100 UNIT/ML IV SOLN
500.0000 [IU] | Freq: Once | INTRAVENOUS | Status: AC
  Administered 2015-08-10: 500 [IU] via INTRAVENOUS
  Filled 2015-08-10: qty 5

## 2015-08-10 MED ORDER — SODIUM CHLORIDE 0.9 % IV SOLN
Freq: Once | INTRAVENOUS | Status: AC
  Administered 2015-08-10: 750 mL via INTRAVENOUS

## 2015-08-10 NOTE — Telephone Encounter (Signed)
Returned call to Gabriel Marsh Physical therapist stated patient's blood pressure was 80/60, just took again and is 90/60,  I suggested he go to the ED, then while I was talking with Gabriel Marsh, Dr. Tenna Marsh Nurse was on the opther line, since they are Primary I deferred off, I did speak with ,Gabriel Marsh,niece this am and patient is very weak, taking 3 pain meds before treatment at 930am,  2:47 PM

## 2015-08-10 NOTE — Telephone Encounter (Signed)
Gabriel Marsh PT with [facility] called from pt home. The pt is orthostatic 80/60 to 60/50. S/w Selena Lesser and will have pt come in for labs and [facility]. Family member stated she can be here in a good 30 minutes. Pt is dressed and all she has to do is get him to the car. POF sent.

## 2015-08-10 NOTE — Patient Instructions (Signed)
Hypotension  As your heart beats, it forces blood through your arteries. This force is your blood pressure. If your blood pressure is too low for you to go about your normal activities or to support the organs of your body, you have hypotension. Hypotension is also referred to as low blood pressure. When your blood pressure becomes too low, you may not get enough blood to your brain. As a result, you may feel weak, feel lightheaded, or develop a rapid heart rate. In a more severe case, you may faint.  CAUSES  Various conditions can cause hypotension. These include:  · Blood loss.  · Dehydration.  · Heart or endocrine problems.  · Pregnancy.  · Severe infection.  · Not having a well-balanced diet filled with needed nutrients.  · Severe allergic reactions (anaphylaxis).  Some medicines, such as blood pressure medicine or water pills (diuretics), may lower your blood pressure below normal. Sometimes taking too much medicine or taking medicine not as directed can cause hypotension.  TREATMENT   Hospitalization is sometimes required for hypotension if fluid or blood replacement is needed, if time is needed for medicines to wear off, or if further monitoring is needed. Treatment might include changing your diet, changing your medicines (including medicines aimed at raising your blood pressure), and use of support stockings.  HOME CARE INSTRUCTIONS   · Drink enough fluids to keep your urine clear or pale yellow.  · Take your medicines as directed by your health care provider.  · Get up slowly from reclining or sitting positions. This gives your blood pressure a chance to adjust.  · Wear support stockings as directed by your health care provider.  · Maintain a healthy diet by including nutritious food, such as fruits, vegetables, nuts, whole grains, and lean meats.  SEEK MEDICAL CARE IF:  · You have vomiting or diarrhea.  · You have a fever for more than 2-3 days.  · You feel more thirsty than usual.  · You feel weak and  tired.  SEEK IMMEDIATE MEDICAL CARE IF:   · You have chest pain or a fast or irregular heartbeat.  · You have a loss of feeling in some part of your body, or you lose movement in your arms or legs.  · You have trouble speaking.  · You become sweaty or feel lightheaded.  · You faint.  MAKE SURE YOU:   · Understand these instructions.  · Will watch your condition.  · Will get help right away if you are not doing well or get worse.     This information is not intended to replace advice given to you by your health care provider. Make sure you discuss any questions you have with your health care provider.     Document Released: 01/10/2005 Document Revised: 10/31/2012 Document Reviewed: 07/13/2012  Elsevier Interactive Patient Education ©2016 [employer]vier Inc.

## 2015-08-11 ENCOUNTER — Ambulatory Visit
Admission: RE | Admit: 2015-08-11 | Discharge: 2015-08-11 | Disposition: A | Discharge status: Home / Self Care | Payer: No Typology Code available for payment source | Source: Ambulatory Visit | Attending: Radiation Oncology | Admitting: Radiation Oncology

## 2015-08-11 DIAGNOSIS — Z510 Encounter for antineoplastic radiation therapy: Secondary | ICD-10-CM | POA: Diagnosis not present

## 2015-08-12 ENCOUNTER — Ambulatory Visit
Admission: RE | Admit: 2015-08-12 | Discharge: 2015-08-12 | Disposition: A | Discharge status: Home / Self Care | Payer: No Typology Code available for payment source | Source: Ambulatory Visit | Attending: Radiation Oncology | Admitting: Radiation Oncology

## 2015-08-12 ENCOUNTER — Encounter: Payer: Self-pay | Admitting: *Deleted

## 2015-08-12 ENCOUNTER — Encounter: Payer: Self-pay | Admitting: Radiation Oncology

## 2015-08-12 DIAGNOSIS — Z510 Encounter for antineoplastic radiation therapy: Secondary | ICD-10-CM | POA: Diagnosis not present

## 2015-08-12 NOTE — Progress Notes (Signed)
[place] Work  Clinical Social Work was referred by nurse for assessment of psychosocial needs due to caregiving concerns at home.  Clinical Social Worker contacted Orthoptist and HCPOA, [name] at home to offer support and assess for needs.  Niece, [name] reports pt was living alone in [place] until he was diagnosed. She brought him to her home in order to have a caregiver and to help get him to treatment. She reports pt is getting weaker and she is struggling to care for him at home alone. Niece is 55 yo herself and states pt has no children or other family. She reports he has [facility] coming to the house and has contacted the [name] to request a CNA. She reports the [name] is looking for CNA services and will get back to her soon. She reports he has a walker and she is able to get him in and out of the car right now. CSW educated HCPOA of additional [facility] resources to help with transportation. Pt has wheelchair as well, if needed and bedside commode.   HCPOA voiced concern about nutrition and diet. She reports she has struggled to get him to eat. CSW will make referral to dietician for possible follow up. CSW provided supportive listening and emotional support. CSW also educated her about additional resources at [facility], provided her with her [place]. CSW plans to check in at treatment later this week and reassess needs. HCPOA aware to reach out as needed.   Clinical Social Work interventions: Resource education and referral Supportive listening and support  Loren Racer, [place] Worker [place]  [place] Phone: [phone] Fax: [phone]

## 2015-08-13 ENCOUNTER — Encounter (HOSPITAL_COMMUNITY): Payer: Self-pay | Admitting: Emergency Medicine

## 2015-08-13 ENCOUNTER — Telehealth: Payer: Self-pay | Admitting: Nurse Practitioner

## 2015-08-13 ENCOUNTER — Encounter (HOSPITAL_COMMUNITY): Payer: Self-pay

## 2015-08-13 ENCOUNTER — Emergency Department (HOSPITAL_COMMUNITY): Payer: Medicare Other

## 2015-08-13 ENCOUNTER — Encounter: Payer: Self-pay | Admitting: Radiation Oncology

## 2015-08-13 ENCOUNTER — Encounter: Payer: Self-pay | Admitting: Nurse Practitioner

## 2015-08-13 ENCOUNTER — Ambulatory Visit: Payer: No Typology Code available for payment source

## 2015-08-13 ENCOUNTER — Inpatient Hospital Stay (HOSPITAL_COMMUNITY)
Admission: EM | Admit: 2015-08-13 | Discharge: 2015-08-25 | DRG: 180 | Disposition: E | Payer: Medicare Other | Attending: Internal Medicine | Admitting: Internal Medicine

## 2015-08-13 DIAGNOSIS — Z8249 Family history of ischemic heart disease and other diseases of the circulatory system: Secondary | ICD-10-CM

## 2015-08-13 DIAGNOSIS — I2510 Atherosclerotic heart disease of native coronary artery without angina pectoris: Secondary | ICD-10-CM | POA: Diagnosis present

## 2015-08-13 DIAGNOSIS — J189 Pneumonia, unspecified organism: Secondary | ICD-10-CM | POA: Diagnosis present

## 2015-08-13 DIAGNOSIS — E785 Hyperlipidemia, unspecified: Secondary | ICD-10-CM | POA: Diagnosis present

## 2015-08-13 DIAGNOSIS — Z951 Presence of aortocoronary bypass graft: Secondary | ICD-10-CM

## 2015-08-13 DIAGNOSIS — I340 Nonrheumatic mitral (valve) insufficiency: Secondary | ICD-10-CM | POA: Diagnosis present

## 2015-08-13 DIAGNOSIS — Z888 Allergy status to other drugs, medicaments and biological substances status: Secondary | ICD-10-CM

## 2015-08-13 DIAGNOSIS — I4891 Unspecified atrial fibrillation: Secondary | ICD-10-CM | POA: Diagnosis present

## 2015-08-13 DIAGNOSIS — Z91013 Allergy to seafood: Secondary | ICD-10-CM | POA: Diagnosis not present

## 2015-08-13 DIAGNOSIS — I110 Hypertensive heart disease with heart failure: Secondary | ICD-10-CM | POA: Diagnosis present

## 2015-08-13 DIAGNOSIS — Z79899 Other long term (current) drug therapy: Secondary | ICD-10-CM

## 2015-08-13 DIAGNOSIS — Z953 Presence of xenogenic heart valve: Secondary | ICD-10-CM

## 2015-08-13 DIAGNOSIS — Z880 Allergy status to penicillin: Secondary | ICD-10-CM

## 2015-08-13 DIAGNOSIS — R000 Tachycardia, unspecified: Secondary | ICD-10-CM | POA: Diagnosis present

## 2015-08-13 DIAGNOSIS — E860 Dehydration: Secondary | ICD-10-CM | POA: Insufficient documentation

## 2015-08-13 DIAGNOSIS — I951 Orthostatic hypotension: Secondary | ICD-10-CM | POA: Insufficient documentation

## 2015-08-13 DIAGNOSIS — Z7982 Long term (current) use of aspirin: Secondary | ICD-10-CM

## 2015-08-13 DIAGNOSIS — R0602 Shortness of breath: Secondary | ICD-10-CM | POA: Diagnosis present

## 2015-08-13 DIAGNOSIS — Z515 Encounter for palliative care: Secondary | ICD-10-CM | POA: Diagnosis present

## 2015-08-13 DIAGNOSIS — J9601 Acute respiratory failure with hypoxia: Secondary | ICD-10-CM | POA: Diagnosis present

## 2015-08-13 DIAGNOSIS — J9691 Respiratory failure, unspecified with hypoxia: Secondary | ICD-10-CM

## 2015-08-13 DIAGNOSIS — I5022 Chronic systolic (congestive) heart failure: Secondary | ICD-10-CM | POA: Diagnosis present

## 2015-08-13 DIAGNOSIS — C3490 Malignant neoplasm of unspecified part of unspecified bronchus or lung: Secondary | ICD-10-CM | POA: Diagnosis present

## 2015-08-13 DIAGNOSIS — Z66 Do not resuscitate: Secondary | ICD-10-CM | POA: Diagnosis not present

## 2015-08-13 DIAGNOSIS — Z882 Allergy status to sulfonamides status: Secondary | ICD-10-CM | POA: Diagnosis not present

## 2015-08-13 HISTORY — DX: Malignant neoplasm of unspecified part of unspecified bronchus or lung: C34.90

## 2015-08-13 LAB — I-STAT TROPONIN, ED: TROPONIN I, POC: 0.07 ng/mL (ref 0.00–0.08)

## 2015-08-13 LAB — I-STAT CG4 LACTIC ACID, ED: Lactic Acid, Venous: 3 mmol/L (ref 0.5–1.9)

## 2015-08-13 MED ORDER — ATROPINE SULFATE 1 % OP SOLN
4.0000 [drp] | OPHTHALMIC | Status: DC | PRN
  Filled 2015-08-13: qty 2

## 2015-08-13 MED ORDER — BIOTENE DRY MOUTH MT LIQD: 15.0000 mL | OROMUCOSAL | Status: DC | PRN

## 2015-08-13 MED ORDER — HALOPERIDOL LACTATE 5 MG/ML IJ SOLN: 0.5000 mg | INTRAMUSCULAR | Status: DC | PRN

## 2015-08-13 MED ORDER — ONDANSETRON 4 MG PO TBDP: 4.0000 mg | ORAL_TABLET | Freq: Four times a day (QID) | ORAL | Status: DC | PRN

## 2015-08-13 MED ORDER — DEXTROSE 5 % IV SOLN
0.0000 ug/min | INTRAVENOUS | Status: DC
  Filled 2015-08-13: qty 1

## 2015-08-13 MED ORDER — MORPHINE SULFATE (PF) 2 MG/ML IV SOLN
1.0000 mg | INTRAVENOUS | Status: DC | PRN
  Administered 2015-08-13: 1 mg via INTRAVENOUS
  Filled 2015-08-13: qty 1

## 2015-08-13 MED ORDER — HALOPERIDOL 0.5 MG PO TABS
0.5000 mg | ORAL_TABLET | ORAL | Status: DC | PRN
  Filled 2015-08-13: qty 1

## 2015-08-13 MED ORDER — ACETAMINOPHEN 650 MG RE SUPP: 650.0000 mg | Freq: Four times a day (QID) | RECTAL | Status: DC | PRN

## 2015-08-13 MED ORDER — HALOPERIDOL LACTATE 2 MG/ML PO CONC: 0.5000 mg | ORAL | Status: DC | PRN

## 2015-08-13 MED ORDER — MORPHINE SULFATE (PF) 2 MG/ML IV SOLN
2.0000 mg | Freq: Once | INTRAVENOUS | Status: AC
  Administered 2015-08-13: 2 mg via INTRAVENOUS
  Filled 2015-08-13: qty 1

## 2015-08-13 MED ORDER — SODIUM CHLORIDE 0.9 % IV SOLN
2.0000 mg/h | INTRAVENOUS | Status: DC
  Administered 2015-08-13: 1 mg/h via INTRAVENOUS
  Filled 2015-08-13: qty 10

## 2015-08-13 MED ORDER — EPINEPHRINE HCL 1 MG/ML IJ SOLN
INTRAMUSCULAR | Status: AC
  Filled 2015-08-13: qty 1

## 2015-08-13 MED ORDER — NOREPINEPHRINE BITARTRATE 1 MG/ML IV SOLN: 2.0000 ug/kg/min | Freq: Once | INTRAVENOUS | Status: DC

## 2015-08-13 MED ORDER — POLYVINYL ALCOHOL 1.4 % OP SOLN
1.0000 [drp] | Freq: Four times a day (QID) | OPHTHALMIC | Status: DC | PRN
  Administered 2015-08-13: 1 [drp] via OPHTHALMIC
  Filled 2015-08-13: qty 15

## 2015-08-13 MED ORDER — ACETAMINOPHEN 325 MG PO TABS: 650.0000 mg | ORAL_TABLET | Freq: Four times a day (QID) | ORAL | Status: DC | PRN

## 2015-08-13 MED ORDER — ONDANSETRON HCL 4 MG/2ML IJ SOLN: 4.0000 mg | Freq: Four times a day (QID) | INTRAMUSCULAR | Status: DC | PRN

## 2015-08-13 MED ORDER — ATROPINE SULFATE 1 % OP SOLN
4.0000 [drp] | OPHTHALMIC | Status: DC | PRN
  Administered 2015-08-13: 4 [drp] via SUBLINGUAL
  Filled 2015-08-13: qty 2

## 2015-08-14 ENCOUNTER — Other Ambulatory Visit: Payer: Medicare Other

## 2015-08-14 ENCOUNTER — Ambulatory Visit: Payer: No Typology Code available for payment source

## 2015-08-14 ENCOUNTER — Encounter: Payer: Non-veteran care | Admitting: Nutrition

## 2015-08-14 ENCOUNTER — Ambulatory Visit: Payer: Medicare Other

## 2015-08-14 ENCOUNTER — Ambulatory Visit: Payer: Non-veteran care

## 2015-08-17 ENCOUNTER — Ambulatory Visit: Payer: No Typology Code available for payment source

## 2015-08-18 ENCOUNTER — Ambulatory Visit: Payer: No Typology Code available for payment source

## 2015-08-19 ENCOUNTER — Ambulatory Visit: Payer: No Typology Code available for payment source

## 2015-08-20 ENCOUNTER — Ambulatory Visit: Payer: No Typology Code available for payment source

## 2015-08-21 ENCOUNTER — Ambulatory Visit: Payer: Non-veteran care

## 2015-08-21 ENCOUNTER — Encounter: Payer: Self-pay | Admitting: Radiation Oncology

## 2015-08-21 ENCOUNTER — Ambulatory Visit: Payer: No Typology Code available for payment source

## 2015-08-21 ENCOUNTER — Ambulatory Visit: Payer: Non-veteran care | Admitting: Oncology

## 2015-08-21 ENCOUNTER — Other Ambulatory Visit: Payer: Non-veteran care

## 2015-08-24 ENCOUNTER — Ambulatory Visit: Payer: No Typology Code available for payment source

## 2015-08-25 ENCOUNTER — Ambulatory Visit: Payer: No Typology Code available for payment source

## 2015-08-25 NOTE — H&P (Signed)
History and Physical    Gabriel Marsh TGG:[id number] DOB: [birth year] DOA: 2015/09/03  PCP: [facility]  Patient coming from: Home  Chief Complaint: Labored breathing  HPI: Gabriel Marsh is a 82 y.o. male with medical history significant of stage IIIA lung cancer, aortic stenosis/insufficiency, chronic CHF with LVEF of 35-40%, and HTN. Patient is actively getting treatment for lung cancer, he was getting active treatment with chemotherapy and radiation. Patient was feeling weaker yesterday and overnight he had severe SOB and increased work of breathing so he came into the hospital for further evaluation was found to be hypoxic.  ED Course:  Vitals: Temp 99.1, respiratory rate is 29, pulse is 123 and initial blood pressure 62/41 with SPO2 of 72% on NRBM Labs: Lactic acid of 3.0 Imaging: Chest x-ray shows bilateral pneumonia Interventions: Discussion of full comfort initiated in the emergency department by ED physician.  Review of Systems:  Unobtainable as patient was somnolent  Past Medical History  Diagnosis Date  . Hypertension   . Hyperlipidemia   . Mitral regurgitation     Moderate by 08/20/11 echo  . Shortness of breath   . CAD (coronary artery disease)     a) 08/22/11 cath :  50-60% left main stenosis, mild-moderate LAD disease, 99% ostial large diagonal stenosis, occluded and collateralized LCx and RCA  . Aortic stenosis     Severe by 08/20/11 echo  . Aortic insufficiency     Moderate by 08/20/11 echo  . Chronic systolic CHF (congestive heart failure) ([place])     a) 08/20/11 echo : LVEF 35-40%  . Lung cancer [facility])     Past Surgical History  Procedure Laterality Date  . Eye surgery    . Cardiac catheterization  08/22/11     50-60% left main stenosis, mild-moderate LAD disease, 99% ostial large diagonal stenosis, occluded and collateralized LCx and RCA  . Coronary artery bypass graft  08/25/2011    Procedure: CORONARY ARTERY BYPASS GRAFTING (CABG);  Surgeon: Grace Isaac, MD;  Location: [facility];  Service: Open Heart Surgery;  Laterality: N/A;  Hot and humid all through surgery.  . Aortic valve replacement  08/25/2011    Procedure: AORTIC VALVE REPLACEMENT (AVR);  Surgeon: Grace Isaac, MD;  Location: [facility];  Service: Open Heart Surgery;  Laterality: N/A;  hot and humid all through surgery.  . Intra-aortic balloon pump insertion N/A 08/24/2011    Procedure: INTRA-AORTIC BALLOON PUMP INSERTION;  Surgeon: Peter M Martinique, MD;  Location: [facility] CATH LAB;  Service: Cardiovascular;  Laterality: N/A;  . Implantable cardioverter defibrillator implant N/A 09/06/2011    Procedure: IMPLANTABLE CARDIOVERTER DEFIBRILLATOR IMPLANT;  Surgeon: Evans Lance, MD;  Location: [facility] CATH LAB;  Service: Cardiovascular;  Laterality: N/A;     reports that he quit smoking about 20 years ago. He does not have any smokeless tobacco history on file. He reports that he does not drink alcohol or use illicit drugs.  Allergies  Allergen Reactions  . Nifedipine Swelling  . Penicillins Hives    Has patient had a PCN reaction causing immediate rash, facial/tongue/throat swelling, SOB or lightheadedness with hypotension: Yes Has patient had a PCN reaction causing severe rash involving mucus membranes or skin necrosis: Yes Has patient had a PCN reaction that required hospitalization Unknown Has patient had a PCN reaction occurring within the last 10 years: No If all of the above answers are "NO", then may proceed with Cephalosporin use.   . Statins Other (See Comments)  Leg cramps  . Sulfa Antibiotics     Hives   . Fish Allergy Nausea And Vomiting  . Lisinopril Cough    Family History  Problem Relation Age of Onset  . Heart disease Brother     Prior to Admission medications   Medication Sig Start Date End Date Taking? Authorizing Provider  acetaminophen (TYLENOL) 500 MG tablet Take 500-1,000 mg by mouth every 6 (six) hours as needed for moderate pain.    Historical Provider, MD    amiodarone (PACERONE) 200 MG tablet Take 1 tablet (200 mg total) by mouth daily. 12/29/11 07/22/15  Evans Lance, MD  aspirin EC 81 MG tablet Take 81 mg by mouth daily.    Historical Provider, MD  docusate sodium (COLACE) 100 MG capsule Take 1 capsule (100 mg total) by mouth 2 (two) times daily. 07/13/15   Oswald Hillock, MD  guaiFENesin-codeine 100-10 MG/5ML syrup Take 5-10 mLs by mouth every 4 (four) hours as needed for cough. 07/20/15   Wyatt Portela, MD  hydrochlorothiazide (HYDRODIURIL) 25 MG tablet Take 12.5 mg by mouth daily.     Historical Provider, MD  HYDROcodone-acetaminophen (NORCO/VICODIN) 5-325 MG tablet Take 1-2 tablets by mouth every 4 (four) hours as needed for moderate pain. 07/30/15   Hayden Pedro, PA-C  ipratropium-albuterol (DUONEB) 0.5-2.5 (3) MG/3ML SOLN Take 3 mLs by nebulization 4 (four) times daily. 07/13/15   Oswald Hillock, MD  lidocaine-prilocaine (EMLA) cream Apply 1 application topically as needed. Apply 1/2 tsp to skin over port 1 hour  prior to chemotherapy treatment. Do not rub in. Cover with plastic only 07/16/15   Wyatt Portela, MD  losartan (COZAAR) 100 MG tablet Take 100 mg by mouth daily.    Historical Provider, MD  metoprolol tartrate (LOPRESSOR) 25 MG tablet Take 1 tablet (25 mg total) by mouth 2 (two) times daily. Patient taking differently: Take 12.5 mg by mouth 2 (two) times daily. Takings 12.5 in the am and in the PM 09/16/11 07/22/15  Lavon Paganini Angiulli, PA-C  nystatin (MYCOSTATIN) 100000 UNIT/ML suspension Take 5 mLs (500,000 Units total) by mouth 4 (four) times daily. Swish/spit solution. 08/06/15   Kyung Rudd, MD  omeprazole (PRILOSEC) 20 MG capsule Take 20 mg by mouth daily.    Historical Provider, MD  polyethylene glycol (MIRALAX / GLYCOLAX) packet Take 17 g by mouth every other day. 07/13/15   Oswald Hillock, MD  prochlorperazine (COMPAZINE) 10 MG tablet TAKE 1 TABLET BY MOUTH EVERY 6 HOURS AS NEEDED FOR NAUSEA OR VOMITING. 08/03/15   Wyatt Portela, MD   simvastatin (ZOCOR) 10 MG tablet Take 5 mg by mouth every evening.    Historical Provider, MD  sucralfate (CARAFATE) 1 g tablet Take 1 tablet (1 g total) by mouth 4 (four) times daily. 08/06/15   Kyung Rudd, MD  Wound Dressings (SONAFINE) Apply 1 application topically daily.    Historical Provider, MD    Physical Exam:  Constitutional: NAD, calm, comfortable Filed Vitals:   08-Sep-2015 0726 09/08/15 0800 08-Sep-2015 0829 2015/09/08 0946  BP: 48/32  60/38 51/39  Pulse: 116 117 116 46  Temp:    99.5 F (37.5 C)  TempSrc:    Axillary  Resp: '26 23 20 24  '$ SpO2: 87% 86% 88% 90%   Eyes: PERRL, lids and conjunctivae normal ENMT: Mucous membranes are moist. Posterior pharynx clear of any exudate or lesions.Normal dentition.  Neck: normal, supple, no masses, no thyromegaly Respiratory: clear to auscultation bilaterally, no wheezing,  no crackles. Normal respiratory effort. No accessory muscle use.  Cardiovascular: Regular rate and rhythm, no murmurs / rubs / gallops. No extremity edema. 2+ pedal pulses. No carotid bruits.  Abdomen: no tenderness, no masses palpated. No hepatosplenomegaly. Bowel sounds positive.  Musculoskeletal: no clubbing / cyanosis. No joint deformity upper and lower extremities. Good ROM, no contractures. Normal muscle tone.  Skin: no rashes, lesions, ulcers. No induration Neurologic: CN 2-12 grossly intact. Sensation intact, DTR normal. Strength 5/5 in all 4.  Psychiatric: Normal judgment and insight. Alert and oriented x 3. Normal mood.   Labs on Admission: I have personally reviewed following labs and imaging studies  CBC:  Recent Labs Lab 08/07/15 1014 08/10/15 1545  WBC 3.1* 1.6*  NEUTROABS 2.5 1.4*  HGB 11.4* 11.4*  HCT 33.3* 34.0*  MCV 91.5 91.6  PLT 214 409   Basic Metabolic Panel:  Recent Labs Lab 08/07/15 1014 08/10/15 1545  NA 136 135*  K 3.9 4.3  CO2 28 27  GLUCOSE 140 107  BUN 44.4* 55.9*  CREATININE 0.9 1.1  CALCIUM 9.3 8.9   GFR: Estimated  Creatinine Clearance: 51 mL/min (by C-G formula based on Cr of 1.1). Liver Function Tests:  Recent Labs Lab 08/07/15 1014 08/10/15 1545  AST 24 23  ALT 31 38  ALKPHOS 62 64  BILITOT 0.62 0.56  PROT 6.4 6.3*  ALBUMIN 2.4* 2.5*   No results for input(s): LIPASE, AMYLASE in the last 168 hours. No results for input(s): AMMONIA in the last 168 hours. Coagulation Profile: No results for input(s): INR, PROTIME in the last 168 hours. Cardiac Enzymes: No results for input(s): CKTOTAL, CKMB, CKMBINDEX, TROPONINI in the last 168 hours. BNP (last 3 results) No results for input(s): PROBNP in the last 8760 hours. HbA1C: No results for input(s): HGBA1C in the last 72 hours. CBG: No results for input(s): GLUCAP in the last 168 hours. Lipid Profile: No results for input(s): CHOL, HDL, LDLCALC, TRIG, CHOLHDL, LDLDIRECT in the last 72 hours. Thyroid Function Tests: No results for input(s): TSH, T4TOTAL, FREET4, T3FREE, THYROIDAB in the last 72 hours. Anemia Panel: No results for input(s): VITAMINB12, FOLATE, FERRITIN, TIBC, IRON, RETICCTPCT in the last 72 hours. Urine analysis:    Component Value Date/Time   COLORURINE YELLOW 07/09/2015 2053   APPEARANCEUR CLEAR 07/09/2015 2053   LABSPEC 1.020 07/09/2015 2053   PHURINE 6.5 07/09/2015 2053   GLUCOSEU NEGATIVE 07/09/2015 2053   HGBUR NEGATIVE 07/09/2015 2053   BILIRUBINUR NEGATIVE 07/09/2015 2053   KETONESUR 15* 07/09/2015 2053   PROTEINUR NEGATIVE 07/09/2015 2053   UROBILINOGEN 0.2 09/02/2011 1115   NITRITE NEGATIVE 07/09/2015 2053   LEUKOCYTESUR NEGATIVE 07/09/2015 2053   Sepsis Labs: !!!!!!!!!!!!!!!!!!!!!!!!!!!!!!!!!!!!!!!!!!!! '@LABRCNTIP'$ (procalcitonin:4,lacticidven:4) )No results found for this or any previous visit (from the past 240 hour(s)).   Radiological Exams on Admission: Dg Chest Port 1 View  09-08-15  CLINICAL DATA:  Shortness of breath.  History of lung cancer EXAM: PORTABLE CHEST 1 VIEW COMPARISON:  07/09/2015  FINDINGS: Patchy bilateral lung opacity, greater peripherally on the right. There is a background of bronchitic coarsening and hyperinflation chronic distortion of the right hilum. No cardiomegaly. Status post CABG and aortic valve replacement. Dual-chamber ICD/ pacer from the left. Porta catheter from the right, tip at the SVC level. No effusion or pneumothorax. IMPRESSION: Bilateral pneumonia. Electronically Signed   By: Monte Fantasia M.D.   On: 09/08/2015 06:09    Assessment/Plan Principal Problem:   Acute respiratory failure with hypoxia [facility]) Active Problems:   Atrial  fibrillation ([place])   S/P aortic valve replacement with bioprosthetic valve   DNR (do not resuscitate)   Lung cancer ([place])   Acute hypoxic respiratory failure -New, he was presented with oxygen saturation of 72% required nonrebreather mask. -Also has significant work of breathing with very labored breathing.  Stage IIIB lung cancer -Squamous cell lung cancer follows with Dr. Alen Blew. Was actively getting chemotherapy.  Full comfort/end-of-life -Discussion with the family at bedside including his wife, wishing for full comfort. -His wife reported that he has living will for situations like this and he also did talk wanted to be resuscitated. -Patient will be admitted as full comfort, all medications discontinued, started comfort medications. -Continue oxygen for comfort as well.   DVT prophylaxis: None, patient is comfort care Code Status: DO NOT RESUSCITATE Family Communication: Patient seen with the family including his wife, daughter and a grandkid at bedside. Disposition Plan: Full comfort, likely hospital death Consults called: I did notify oncology Mrs. Berniece Salines Admission status: Palliative, inpatient, MedSurg   [facility] A MD [facility] Pager [phone]  If 7PM-7AM, please contact night-coverage [email or web address] Password [email or web address]  09/11/2015, 10:30 AM

## 2015-08-25 NOTE — ED Notes (Signed)
MD at bedside discussing comfort care measures with family.

## 2015-08-25 NOTE — Telephone Encounter (Signed)
Late entry: Called and spoke to patient's niece [name] on Tuesday morning.  08/11/2015 to discuss recommendations once again to hold the HCTZ for the time being to see if this helps with patient's hypertension issues.  Patient lives with his niece at the time being.  Patient was also encouraged to keep a blood pressure log and to follow back up with his [facility].  He was also advised to call/return or go directly to the emergency department for any worsening symptoms whatsoever.

## 2015-08-25 NOTE — Assessment & Plan Note (Signed)
Home health physical therapist called the [facility] earlier today stating that patient was experiencing some orthostatic hypotension.  He was also feeling increasingly weak and the physical therapist thought he appears dehydrated as well.  Patient states that he has been feeling increasingly weak and somewhat dizzy when he stands up.  Patient has a history of hypertension; and takes metoprolol 12.5 mg twice daily, amiodarone 200 mg daily, HCTZ 12.5 mg daily, and losartan 100 mg daily.  Blood pressure while sitting was 126/60 and while standing was 85/46.  Reviewed all findings with Dr. Alen Blew; and patient was given 750 ML's normal saline IV fluid rehydration and also advised to hold the HCTZ in the future.  Patient's cardiologist is Dr. Secundino Ginger in [place], [place]. His primary care is the [facility].  Advised both patient and his wife that he should contact his cardiologist for review of his blood pressure medications.  Both patient and his wife stated that his cardiologist as recommended that he follow up with the [facility] for any hypertension medication changes; since this is his primary care.  Patient's wife states that she has a phone call into the [facility] now to discuss further with his primary care physician.  Advised patient to check his blood pressure daily; and to keep a blood pressure log.  Also, patient was advised to call/return or go directly to the emergency room for any worsening symptoms whatsoever.

## 2015-08-25 NOTE — ED Notes (Signed)
Patient resting comfortably.  Family at bedside.

## 2015-08-25 NOTE — ED Notes (Signed)
Bed: KP22 Expected date:  Expected time:  Means of arrival:  Comments: EMS 82 yo male from home/cancer patient/respiratory distress/cpap 80/50-now on NRB [address]

## 2015-08-25 NOTE — Assessment & Plan Note (Signed)
Patient received cycle 4 of his carboplatin/Taxol chemotherapy on 08/07/2015.  He also continues to receive daily radiation treatments.  He is scheduled to return for labs and chemotherapy on 08/14/2015.

## 2015-08-25 NOTE — ED Provider Notes (Signed)
CSN: [id number]     Arrival date & time 2015/09/11  4627 History   First MD Initiated Contact with Patient 09-11-15 [phone]     Chief Complaint  Patient presents with  . Respiratory Distress     (Consider location/radiation/quality/duration/timing/severity/associated sxs/prior Treatment) HPI Comments: 82 year old male with extensive past medical history including lung cancer, CHF, CAD who presents with respiratory distress. History obtained from EMS and the patient's niece, who is his power of attorney. EMS reports that they were called for respiratory distress. Patient reportedly became short of breath last night and had no relief with home breathing treatments. EMS noted that he was 89% on 2 L and was placed on CPAP with improvement in his breathing but he became hypotensive so they removed CPAP and placed him on nonrebreather. Niece states that he wasn't feeling well last night but she denies any knowledge of recent fevers.  LEVEL 5 CAVEAT DUE TO AMS AND RESPIRATORY DISTRESS  The history is provided by the EMS personnel and a relative.    Past Medical History  Diagnosis Date  . Hypertension   . Hyperlipidemia   . Mitral regurgitation     Moderate by 08/20/11 echo  . Shortness of breath   . CAD (coronary artery disease)     a) 08/22/11 cath :  50-60% left main stenosis, mild-moderate LAD disease, 99% ostial large diagonal stenosis, occluded and collateralized LCx and RCA  . Aortic stenosis     Severe by 08/20/11 echo  . Aortic insufficiency     Moderate by 08/20/11 echo  . Chronic systolic CHF (congestive heart failure) ([place])     a) 08/20/11 echo : LVEF 35-40%  . Lung cancer [facility])    Past Surgical History  Procedure Laterality Date  . Eye surgery    . Cardiac catheterization  08/22/11     50-60% left main stenosis, mild-moderate LAD disease, 99% ostial large diagonal stenosis, occluded and collateralized LCx and RCA  . Coronary artery bypass graft  08/25/2011    Procedure: CORONARY  ARTERY BYPASS GRAFTING (CABG);  Surgeon: Grace Isaac, MD;  Location: [facility] [place];  Service: Open Heart Surgery;  Laterality: N/A;  Hot and humid all through surgery.  . Aortic valve replacement  08/25/2011    Procedure: AORTIC VALVE REPLACEMENT (AVR);  Surgeon: Grace Isaac, MD;  Location: [place];  Service: Open Heart Surgery;  Laterality: N/A;  hot and humid all through surgery.  . Intra-aortic balloon pump insertion N/A 08/24/2011    Procedure: INTRA-AORTIC BALLOON PUMP INSERTION;  Surgeon: Peter M Martinique, MD;  Location: [facility] CATH LAB;  Service: Cardiovascular;  Laterality: N/A;  . Implantable cardioverter defibrillator implant N/A 09/06/2011    Procedure: IMPLANTABLE CARDIOVERTER DEFIBRILLATOR IMPLANT;  Surgeon: Evans Lance, MD;  Location: [facility] CATH LAB;  Service: Cardiovascular;  Laterality: N/A;   Family History  Problem Relation Age of Onset  . Heart disease Brother    Social History  Substance Use Topics  . Smoking status: Former Smoker    Quit date: 01/25/1995  . Smokeless tobacco: None  . Alcohol Use: No    Review of Systems  Unable to perform ROS: Patient unresponsive      Allergies  Nifedipine; Penicillins; Statins; Sulfa antibiotics; Fish allergy; and Lisinopril  Home Medications   Prior to Admission medications   Medication Sig Start Date End Date Taking? Authorizing Provider  acetaminophen (TYLENOL) 500 MG tablet Take 500-1,000 mg by mouth every 6 (six) hours as needed for moderate pain.  Historical Provider, MD  amiodarone (PACERONE) 200 MG tablet Take 1 tablet (200 mg total) by mouth daily. 12/29/11 07/22/15  Evans Lance, MD  aspirin EC 81 MG tablet Take 81 mg by mouth daily.    Historical Provider, MD  docusate sodium (COLACE) 100 MG capsule Take 1 capsule (100 mg total) by mouth 2 (two) times daily. 07/13/15   Oswald Hillock, MD  guaiFENesin-codeine 100-10 MG/5ML syrup Take 5-10 mLs by mouth every 4 (four) hours as needed for cough. 07/20/15   Wyatt Portela, MD   hydrochlorothiazide (HYDRODIURIL) 25 MG tablet Take 12.5 mg by mouth daily.     Historical Provider, MD  HYDROcodone-acetaminophen (NORCO/VICODIN) 5-325 MG tablet Take 1-2 tablets by mouth every 4 (four) hours as needed for moderate pain. 07/30/15   Hayden Pedro, PA-C  ipratropium-albuterol (DUONEB) 0.5-2.5 (3) MG/3ML SOLN Take 3 mLs by nebulization 4 (four) times daily. 07/13/15   Oswald Hillock, MD  lidocaine-prilocaine (EMLA) cream Apply 1 application topically as needed. Apply 1/2 tsp to skin over port 1 hour  prior to chemotherapy treatment. Do not rub in. Cover with plastic only 07/16/15   Wyatt Portela, MD  losartan (COZAAR) 100 MG tablet Take 100 mg by mouth daily.    Historical Provider, MD  metoprolol tartrate (LOPRESSOR) 25 MG tablet Take 1 tablet (25 mg total) by mouth 2 (two) times daily. Patient taking differently: Take 12.5 mg by mouth 2 (two) times daily. Takings 12.5 in the am and in the PM 09/16/11 07/22/15  Lavon Paganini Angiulli, PA-C  nystatin (MYCOSTATIN) 100000 UNIT/ML suspension Take 5 mLs (500,000 Units total) by mouth 4 (four) times daily. Swish/spit solution. 08/06/15   Kyung Rudd, MD  omeprazole (PRILOSEC) 20 MG capsule Take 20 mg by mouth daily.    Historical Provider, MD  polyethylene glycol (MIRALAX / GLYCOLAX) packet Take 17 g by mouth every other day. 07/13/15   Oswald Hillock, MD  prochlorperazine (COMPAZINE) 10 MG tablet TAKE 1 TABLET BY MOUTH EVERY 6 HOURS AS NEEDED FOR NAUSEA OR VOMITING. 08/03/15   Wyatt Portela, MD  simvastatin (ZOCOR) 10 MG tablet Take 5 mg by mouth every evening.    Historical Provider, MD  sucralfate (CARAFATE) 1 g tablet Take 1 tablet (1 g total) by mouth 4 (four) times daily. 08/06/15   Kyung Rudd, MD  Wound Dressings (SONAFINE) Apply 1 application topically daily.    Historical Provider, MD   BP 55/39 mmHg  Pulse 116  Temp(Src) 99.1 F (37.3 C) (Axillary)  Resp 23  SpO2 78% Physical Exam  Constitutional: He appears distressed.  Ill  appearing elderly man in respiratory distress, unresponsive  HENT:  Head: Normocephalic and atraumatic.  Moist mucous membranes  Eyes: Conjunctivae are normal. Pupils are equal, round, and reactive to light. Right eye exhibits discharge. Left eye exhibits discharge.  Neck: No tracheal deviation present.  Cardiovascular: Regular rhythm and normal heart sounds.  Tachycardia present.   No murmur heard. Pulmonary/Chest: He is in respiratory distress. He has rales.  Tachypnea w/ accessory muscle use  Abdominal: Soft. Bowel sounds are normal. He exhibits no distension. There is no tenderness.  Musculoskeletal: He exhibits edema (mild BLE).  Neurological:  Unresponsive to voice, moving all 4 extremities  Skin: Skin is warm and dry. There is pallor.  Nursing note and vitals reviewed.   ED Course  .Critical Care Performed by: Sharlett Iles Authorized by: Sharlett Iles Total critical care time: 45 minutes Critical care time  was exclusive of separately billable procedures and treating other patients. Critical care was necessary to treat or prevent imminent or life-threatening deterioration of the following conditions: respiratory failure. Critical care was time spent personally by me on the following activities: development of treatment plan with patient or surrogate, evaluation of patient's response to treatment, examination of patient, obtaining history from patient or surrogate, ordering and review of radiographic studies, ordering and performing treatments and interventions, re-evaluation of patient's condition and review of old charts.   (including critical care time) Labs Review Labs Reviewed  I-STAT CG4 LACTIC ACID, ED - Abnormal; Notable for the following:    Lactic Acid, Venous 3.00 (*)    All other components within normal limits  Randolm Idol, ED    Imaging Review Dg Chest Port 1 View  09/09/2015  CLINICAL DATA:  Shortness of breath.  History of lung cancer  EXAM: PORTABLE CHEST 1 VIEW COMPARISON:  07/09/2015 FINDINGS: Patchy bilateral lung opacity, greater peripherally on the right. There is a background of bronchitic coarsening and hyperinflation chronic distortion of the right hilum. No cardiomegaly. Status post CABG and aortic valve replacement. Dual-chamber ICD/ pacer from the left. Porta catheter from the right, tip at the SVC level. No effusion or pneumothorax. IMPRESSION: Bilateral pneumonia. Electronically Signed   By: Monte Fantasia M.D.   On: 09/09/2015 06:09   I have personally reviewed and evaluated these images and lab results as part of my medical decision-making.   EKG Interpretation None     Medications  morphine 2 MG/ML injection 2 mg (not administered)  morphine 2 MG/ML injection 2 mg (2 mg Intravenous Given 09-09-15 0602)    MDM   Final diagnoses:  Respiratory failure with hypoxia, unspecified chronicity ([place])   Patient with history of lung cancer and CHF presents with respiratory distress from home. On arrival, he was severely dyspneic and in respiratory distress. He was tachycardic, hypotensive, and hypoxic even when placed on BiPAP. Bilateral lung sounds present, no evidence of pneumothorax. Chest x-ray suggests bilateral opacities consistent with pneumonia. I spoke with the patient's niece, who is his power of attorney and was at bedside. She states that he is DNR/DNI and would want comfort care only with no drastic interventions. I explained that without interventions the patient would likely die quickly and she voiced understanding. She requested anything to make him comfortable. Gave the patient morphine and eventually switched him from BiPAP to nasal cannula. I discussed palliative care admission with [facility] hospitalist, Dr. Hartford Poli, who will admit pt for comfort care only.   Sharlett Iles, MD 09-Sep-2015 [phone]

## 2015-08-25 NOTE — Progress Notes (Cosign Needed)
RN called to room by family at 38, patient with out heartbeat or respiration. Confirmed with Drue Dun RN.

## 2015-08-25 NOTE — Progress Notes (Signed)
Events noted. Patient presented with acute respiratory failure in the setting of advanced lung malignancy and DO NOT RESUSCITATE status. I agree with the current management of comfort care only as he is receiving. These findings were discussed with the family and all the questions are answered. He appears to be actively dying and no aggressive measures are needed at this time.  I appreciate the care of the primary team and the nursing staff.

## 2015-08-25 NOTE — ED Notes (Signed)
Per EMS, Pt has hx of lung cancer. Has been receiving radiation therapy. Last night at 1500 he got SOB. Home neb treatments were not helping. EMS reported hearing rales in all fields and pt O2 sat 89% at his usual 2L. Pt was put on CPAP and rales cleared and pt became hypotensive. EMS removed CPAP and then O2 sats improved on nonrebreather mask at 15L to 96%.

## 2015-08-25 NOTE — Progress Notes (Signed)
Pt was took off  BIPAP and placed on 2 LPM  by RN.

## 2015-08-25 NOTE — Progress Notes (Signed)
SYMPTOM MANAGEMENT CLINIC    Chief Complaint: Hypotension  HPI:  Gabriel Marsh 82 y.o. male diagnosed with  lung cancer.  Currently undergoing carboplatin/Taxol chemotherapy regimen and radiation treatments.   Home health physical therapist called the [facility] earlier today stating that patient was experiencing some orthostatic hypotension.  He was also feeling increasingly weak and the physical therapist thought he appears dehydrated as well.  Patient states that he has been feeling increasingly weak and somewhat dizzy when he stands up.  Patient has a history of hypertension; and takes metoprolol 12.5 mg twice daily, amiodarone 200 mg daily, HCTZ 12.5 mg daily, and losartan 100 mg daily.  Blood pressure while sitting was 126/60 and while standing was 85/46.  Reviewed all findings with Dr. Alen Blew; and patient was given 750 ML's normal saline IV fluid rehydration and also advised to hold the HCTZ in the future.  Patient's cardiologist is Dr. Secundino Ginger in [place], [place]. His primary care is the [facility].  Advised both patient and his wife that he should contact his cardiologist for review of his blood pressure medications.  Both patient and his wife stated that his cardiologist as recommended that he follow up with the [facility] for any hypertension medication changes; since this is his primary care.  Patient's wife states that she has a phone call into the [facility] now to discuss further with his primary care physician.  Advised patient to check his blood pressure daily; and to keep a blood pressure log.  Also, patient was advised to call/return or go directly to the emergency room for any worsening symptoms whatsoever.     No history exists.    Review of Systems  Constitutional: Positive for weight loss and malaise/fatigue.  Neurological: Positive for weakness.  All other systems reviewed and are negative.   Past Medical History  Diagnosis Date  .  Hypertension   . Hyperlipidemia   . Mitral regurgitation     Moderate by 08/20/11 echo  . Shortness of breath   . CAD (coronary artery disease)     a) 08/22/11 cath :  50-60% left main stenosis, mild-moderate LAD disease, 99% ostial large diagonal stenosis, occluded and collateralized LCx and RCA  . Aortic stenosis     Severe by 08/20/11 echo  . Aortic insufficiency     Moderate by 08/20/11 echo  . Chronic systolic CHF (congestive heart failure) ([place])     a) 08/20/11 echo : LVEF 35-40%  . Lung cancer [facility])     Past Surgical History  Procedure Laterality Date  . Eye surgery    . Cardiac catheterization  08/22/11     50-60% left main stenosis, mild-moderate LAD disease, 99% ostial large diagonal stenosis, occluded and collateralized LCx and RCA  . Coronary artery bypass graft  08/25/2011    Procedure: CORONARY ARTERY BYPASS GRAFTING (CABG);  Surgeon: Grace Isaac, MD;  Location: [facility];  Service: Open Heart Surgery;  Laterality: N/A;  Hot and humid all through surgery.  . Aortic valve replacement  08/25/2011    Procedure: AORTIC VALVE REPLACEMENT (AVR);  Surgeon: Grace Isaac, MD;  Location: [facility];  Service: Open Heart Surgery;  Laterality: N/A;  hot and humid all through surgery.  . Intra-aortic balloon pump insertion N/A 08/24/2011    Procedure: INTRA-AORTIC BALLOON PUMP INSERTION;  Surgeon: Peter M Martinique, MD;  Location: [facility] CATH LAB;  Service: Cardiovascular;  Laterality: N/A;  . Implantable cardioverter defibrillator implant N/A 09/06/2011    Procedure: IMPLANTABLE  CARDIOVERTER DEFIBRILLATOR IMPLANT;  Surgeon: Evans Lance, MD;  Location: [facility] CATH LAB;  Service: Cardiovascular;  Laterality: N/A;    has Benign hypertensive heart disease without heart failure; Pure hypercholesterolemia; CAD (coronary artery disease), native coronary artery; Hypertension; Hyperlipidemia; Chronic systolic CHF (congestive heart failure) ([place]); Moderate mitral regurgitation; NSTEMI (non-ST elevated  myocardial infarction) ([place]); Ventricular tachycardia, paroxysmal ([place]); Atrial fibrillation ([place]); Acute on chronic systolic heart failure ([place]); ICD-St.[employer]ude; Physical deconditioning; S/P aortic valve replacement with bioprosthetic valve; Bronchus, R mainstem stenosis; S/P CABG x 3,  5053; Chronic systolic heart failure ([place]); DNR (do not resuscitate); Mainstem bronchial stenosis; Cancer of carina of right bronchus ([place]); Respiratory failure ([place]); Orthostatic hypotension; and Dehydration on his problem list.    is allergic to nifedipine; penicillins; statins; sulfa antibiotics; fish allergy; and lisinopril.    Medication List       This list is accurate as of: 08/10/15 11:59 PM.  Always use your most recent med list.               acetaminophen 500 MG tablet  Commonly known as:  TYLENOL  Take 500-1,000 mg by mouth every 6 (six) hours as needed for moderate pain.     amiodarone 200 MG tablet  Commonly known as:  PACERONE  Take 1 tablet (200 mg total) by mouth daily.     aspirin EC 81 MG tablet  Take 81 mg by mouth daily.     docusate sodium 100 MG capsule  Commonly known as:  COLACE  Take 1 capsule (100 mg total) by mouth 2 (two) times daily.     guaiFENesin-codeine 100-10 MG/5ML syrup  Take 5-10 mLs by mouth every 4 (four) hours as needed for cough.     hydrochlorothiazide 25 MG tablet  Commonly known as:  HYDRODIURIL  Take 12.5 mg by mouth daily.     HYDROcodone-acetaminophen 5-325 MG tablet  Commonly known as:  NORCO/VICODIN  Take 1-2 tablets by mouth every 4 (four) hours as needed for moderate pain.     ipratropium-albuterol 0.5-2.5 (3) MG/3ML Soln  Commonly known as:  DUONEB  Take 3 mLs by nebulization 4 (four) times daily.     lidocaine-prilocaine cream  Commonly known as:  EMLA  Apply 1 application topically as needed. Apply 1/2 tsp to skin over port 1 hour  prior to chemotherapy treatment. Do not rub in. Cover with plastic only     losartan 100 MG tablet    Commonly known as:  COZAAR  Take 100 mg by mouth daily.     metoprolol tartrate 25 MG tablet  Commonly known as:  LOPRESSOR  Take 1 tablet (25 mg total) by mouth 2 (two) times daily.     nystatin 100000 UNIT/ML suspension  Commonly known as:  MYCOSTATIN  Take 5 mLs (500,000 Units total) by mouth 4 (four) times daily. Swish/spit solution.     omeprazole 20 MG capsule  Commonly known as:  PRILOSEC  Take 20 mg by mouth daily.     polyethylene glycol packet  Commonly known as:  MIRALAX / GLYCOLAX  Take 17 g by mouth every other day.     prochlorperazine 10 MG tablet  Commonly known as:  COMPAZINE  TAKE 1 TABLET BY MOUTH EVERY 6 HOURS AS NEEDED FOR NAUSEA OR VOMITING.     simvastatin 10 MG tablet  Commonly known as:  ZOCOR  Take 5 mg by mouth every evening.     SONAFINE  Apply 1 application topically daily.  sucralfate 1 g tablet  Commonly known as:  CARAFATE  Take 1 tablet (1 g total) by mouth 4 (four) times daily.         PHYSICAL EXAMINATION  Oncology Vitals 08-15-15 08/15/15  Temp 99.5 -  Pulse 46 116  Resp 24 20  SpO2 90 88   BP Readings from Last 2 Encounters:  2015-08-15 51/39  08/10/15 106/59    Physical Exam  Constitutional: He is oriented to person, place, and time. He appears malnourished and dehydrated. He appears unhealthy. He appears cachectic.  HENT:  Head: Normocephalic and atraumatic.  Mouth/Throat: Oropharynx is clear and moist.  Eyes: Conjunctivae and EOM are normal. Pupils are equal, round, and reactive to light. Right eye exhibits no discharge. Left eye exhibits no discharge. No scleral icterus.  Neck: Normal range of motion. Neck supple. No JVD present. No tracheal deviation present. No thyromegaly present.  Cardiovascular: Normal rate, regular rhythm, normal heart sounds and intact distal pulses.   Pulmonary/Chest: Effort normal and breath sounds normal. No respiratory distress. He has no wheezes. He has no rales. He exhibits no  tenderness.  Abdominal: Soft. Bowel sounds are normal. He exhibits no distension and no mass. There is no tenderness. There is no rebound and no guarding.  Musculoskeletal: Normal range of motion. He exhibits no edema or tenderness.  Lymphadenopathy:    He has no cervical adenopathy.  Neurological: He is alert and oriented to person, place, and time.  Patient was in wheelchair during exam.  Skin: Skin is warm and dry. No rash noted. No erythema. There is pallor.  Psychiatric: Affect normal.    LABORATORY DATA:. Appointment on 08/10/2015  Component Date Value Ref Range Status  . WBC 08/10/2015 1.6* 4.0 - 10.3 10e3/uL Final  . NEUT# 08/10/2015 1.4* 1.5 - 6.5 10e3/uL Final  . HGB 08/10/2015 11.4* 13.0 - 17.1 g/dL Final  . HCT 08/10/2015 34.0* 38.4 - 49.9 % Final  . Platelets 08/10/2015 187  140 - 400 10e3/uL Final  . MCV 08/10/2015 91.6  79.3 - 98.0 fL Final  . MCH 08/10/2015 30.8  27.2 - 33.4 pg Final  . MCHC 08/10/2015 33.6  32.0 - 36.0 g/dL Final  . RBC 08/10/2015 3.71* 4.20 - 5.82 10e6/uL Final  . RDW 08/10/2015 14.8* 11.0 - 14.6 % Final  . lymph# 08/10/2015 0.1* 0.9 - 3.3 10e3/uL Final  . MONO# 08/10/2015 0.1  0.1 - 0.9 10e3/uL Final  . Eosinophils Absolute 08/10/2015 0.0  0.0 - 0.5 10e3/uL Final  . Basophils Absolute 08/10/2015 0.0  0.0 - 0.1 10e3/uL Final  . NEUT% 08/10/2015 86.8* 39.0 - 75.0 % Final  . LYMPH% 08/10/2015 8.6* 14.0 - 49.0 % Final  . MONO% 08/10/2015 4.2  0.0 - 14.0 % Final  . EOS% 08/10/2015 0.1  0.0 - 7.0 % Final  . BASO% 08/10/2015 0.3  0.0 - 2.0 % Final  . Sodium 08/10/2015 135* 136 - 145 mEq/L Final  . Potassium 08/10/2015 4.3  3.5 - 5.1 mEq/L Final  . Chloride 08/10/2015 98  98 - 109 mEq/L Final  . CO2 08/10/2015 27  22 - 29 mEq/L Final  . Glucose 08/10/2015 107  70 - 140 mg/dl Final   Glucose reference range is for nonfasting patients. Fasting glucose reference range is 70- 100.  [place] Kitchen BUN 08/10/2015 55.9* 7.0 - 26.0 mg/dL Final  . Creatinine 08/10/2015  1.1  0.7 - 1.3 mg/dL Final  . Total Bilirubin 08/10/2015 0.56  0.20 - 1.20 mg/dL Final  .  Alkaline Phosphatase 08/10/2015 64  40 - 150 U/L Final  . AST 08/10/2015 23  5 - 34 U/L Final  . ALT 08/10/2015 38  0 - 55 U/L Final  . Total Protein 08/10/2015 6.3* 6.4 - 8.3 g/dL Final  . Albumin 08/10/2015 2.5* 3.5 - 5.0 g/dL Final  . Calcium 08/10/2015 8.9  8.4 - 10.4 mg/dL Final  . Anion Gap 08/10/2015 10  3 - 11 mEq/L Final  . EGFR 08/10/2015 61* >90 ml/min/1.73 m2 Final   eGFR is calculated using the CKD-EPI Creatinine Equation (2009)    RADIOGRAPHIC STUDIES: Dg Chest Port 1 View  2015/09/06  CLINICAL DATA:  Shortness of breath.  History of lung cancer EXAM: PORTABLE CHEST 1 VIEW COMPARISON:  07/09/2015 FINDINGS: Patchy bilateral lung opacity, greater peripherally on the right. There is a background of bronchitic coarsening and hyperinflation chronic distortion of the right hilum. No cardiomegaly. Status post CABG and aortic valve replacement. Dual-chamber ICD/ pacer from the left. Porta catheter from the right, tip at the SVC level. No effusion or pneumothorax. IMPRESSION: Bilateral pneumonia. Electronically Signed   By: Monte Fantasia M.D.   On: 2015-09-06 06:09    ASSESSMENT/PLAN:    Orthostatic hypotension Home health physical therapist called the [facility] earlier today stating that patient was experiencing some orthostatic hypotension.  He was also feeling increasingly weak and the physical therapist thought he appears dehydrated as well.  Patient states that he has been feeling increasingly weak and somewhat dizzy when he stands up.  Patient has a history of hypertension; and takes metoprolol 12.5 mg twice daily, amiodarone 200 mg daily, HCTZ 12.5 mg daily, and losartan 100 mg daily.  Blood pressure while sitting was 126/60 and while standing was 85/46.  Reviewed all findings with Dr. Alen Blew; and patient was given 750 ML's normal saline IV fluid rehydration and also advised to hold  the HCTZ in the future.  Patient's cardiologist is Dr. Secundino Ginger in [place], [place]. His primary care is the [facility].  Advised both patient and his wife that he should contact his cardiologist for review of his blood pressure medications.  Both patient and his wife stated that his cardiologist as recommended that he follow up with the [facility] for any hypertension medication changes; since this is his primary care.  Patient's wife states that she has a phone call into the [facility] now to discuss further with his primary care physician.  Advised patient to check his blood pressure daily; and to keep a blood pressure log.  Also, patient was advised to call/return or go directly to the emergency room for any worsening symptoms whatsoever.    Dehydration Patient appears increasingly weak and dehydrated today.  Since patient has a history of CHF in the past; patient was only given approximate 750 mg normal saline IV fluid rehydration while cancer Center today.  Cancer of carina of right bronchus [facility]) Patient received cycle 4 of his carboplatin/Taxol chemotherapy on 08/07/2015.  He also continues to receive daily radiation treatments.  He is scheduled to return for labs and chemotherapy on 08/14/2015.   Patient stated understanding of all instructions; and was in agreement with this plan of care. The patient knows to call the clinic with any problems, questions or concerns.   Total time spent with patient was 40 minutes;  with greater than 75 percent of that time spent in face to face counseling regarding patient's symptoms,  and coordination of care and follow up.  Disclaimer:This dictation was prepared  with Dragon/digital dictation along with [employer]. Any transcriptional errors that result from this process are unintentional.  Drue Second, NP 16-Aug-2015

## 2015-08-25 NOTE — Significant Event (Signed)
Received call from RN Mrs. Jenny Reichmann, patient still has a lot of secretions in the back of his throat. After receiving 3 doses of morphine his breathing still in the 20s and very labored.  Can do the sublingual atropine every 1 hour as needed for oral/oropharyngeal secretions We'll discontinue the morphine pushes and start on morphine drip.  Birdie Hopes Pager: [phone] 2015-08-28, 12:03 PM

## 2015-08-25 NOTE — Discharge Summary (Signed)
Death Summary  Gabriel Marsh MLY:[id number] DOB: 1930-03-17 DOA: 09/10/15  PCP: [facility] date: September 10, 2015 Date of Death: 09/10/2015 Time of Death: 15:10 hrs. Notification: Oncology notified of death of September 10, 2015   History of present illness:  Gabriel Marsh is a 82 y.o. male with medical history significant of stage IIIA lung cancer, aortic stenosis/insufficiency, chronic CHF with LVEF of 35-40%, and HTN. Patient is actively getting treatment for lung cancer, he was getting active treatment with chemotherapy and radiation. Patient was feeling weaker yesterday and overnight he had severe SOB and increased work of breathing so he came into the hospital for further evaluation was found to be hypoxic.  Brief hospital stay: Initial evaluation in the emergency department patient was extremely hypoxic with SPO2 of 72% on nonrebreather mask, has lactic acid of 3.0 and probably indicating end organ damage from possible sepsis. Patient has a blood pressure of 62/41 on admission tachycardic and RR of 29 indicating that he might be septic. Patient has stage IIIA lung cancer, he is DNR/DNI. Upon initial presentation discussion with the family initiated by EDP, I discussed with the family as well, patient was made full comfort and admitted to the floor to control his symptoms. Initially started on morphine pushes, was still having very labored breathing. Switched to morphine drip, patient expired at 3:10 PM with his family at bedside.   Final Diagnoses:  1.   Acute respiratory failure with hypoxia, stage IIIa lung cancer. Principal Problem:   Acute respiratory failure with hypoxia (HCC) Active Problems:   Atrial fibrillation (HCC)   S/P aortic valve replacement with bioprosthetic valve   DNR (do not resuscitate)   Lung cancer [facility])    The results of significant diagnostics from this hospitalization (including imaging, microbiology, ancillary and laboratory) are listed below for  reference.    Significant Diagnostic Studies: Ir Fluoro Guide Cv Line Right  07/22/2015  INDICATION: History of lung cancer. In need of durable intravenous access for chemotherapy administration. EXAM: IMPLANTED PORT A CATH PLACEMENT WITH ULTRASOUND AND FLUOROSCOPIC GUIDANCE COMPARISON:  PET-CT - 07/03/2015 MEDICATIONS: Vancomycin 1 gm IV; The antibiotic was administered within an appropriate time interval prior to skin puncture. ANESTHESIA/SEDATION: Moderate (conscious) sedation was employed during this procedure. A total of Versed 1 mg and Fentanyl 50 mcg was administered intravenously. Moderate Sedation Time: 25 minutes. The patient's level of consciousness and vital signs were monitored continuously by radiology nursing throughout the procedure under my direct supervision. CONTRAST:  None FLUOROSCOPY TIME:  1 minute (27 mGy) COMPLICATIONS: None immediate. PROCEDURE: The procedure, risks, benefits, and alternatives were explained to the patient. Questions regarding the procedure were encouraged and answered. The patient understands and consents to the procedure. The right chest was selected for Port catheter placement secondary to the presence of a left anterior chest wall dual lead pacemaker. Unfortunately, the patient has extremely friable skin including a skin tear over the superior aspect of the right chest regional to desired location of port reservoir placement. Regardless, the right neck and chest were prepped with chlorhexidine in a sterile fashion, and a sterile drape was applied covering the operative field. Maximum barrier sterile technique with sterile gowns and gloves were used for the procedure. A timeout was performed prior to the initiation of the procedure. Local anesthesia was provided with 1% lidocaine with epinephrine. After creating a small venotomy incision, a micropuncture kit was utilized to access the internal jugular vein under direct, real-time ultrasound guidance. Ultrasound image  documentation was performed. The  microwire was kinked to measure appropriate catheter length. A subcutaneous port pocket was then created along the upper chest wall utilizing a combination of sharp and blunt dissection. The pocket was irrigated with sterile saline. A single lumen ISP power injectable port was chosen for placement. The 8 Fr catheter was tunneled from the port pocket site to the venotomy incision. The port was placed in the pocket. The external catheter was trimmed to appropriate length. At the venotomy, an 8 Fr peel-away sheath was placed over a guidewire under fluoroscopic guidance. The catheter was then placed through the sheath and the sheath was removed. Final catheter positioning was confirmed and documented with a fluoroscopic spot radiograph. The port was accessed with a Huber needle, aspirated and flushed with heparinized saline. The venotomy site was closed with derma bond and Steri-Strips. The port pocket incision was closed with interrupted 2-0 Vicryl suture and the skin was opposed with Dermabond and Steri-strips. Dressings were placed. The patient tolerated the procedure well without immediate post procedural complication. FINDINGS: After catheter placement, the tip lies within the superior cavoatrial junction. The catheter aspirates and flushes normally and is ready for immediate use. IMPRESSION: Successful placement of a right internal jugular approach power injectable Port-A-Cath. The catheter is ready for immediate use. As above, the patient has extremely friable skin including a skin tear at the location of Port a catheter reservoir implantation site (note, the left anterior chest cannot be used for port placement secondary the presence of a left anterior chest wall pacemaker). Electronically Signed   By: Sandi Mariscal M.D.   On: 07/22/2015 16:59   Ir US Guide Vasc Access Right  07/22/2015  INDICATION: History of lung cancer. In need of durable intravenous access for chemotherapy  administration. EXAM: IMPLANTED PORT A CATH PLACEMENT WITH ULTRASOUND AND FLUOROSCOPIC GUIDANCE COMPARISON:  PET-CT - 07/03/2015 MEDICATIONS: Vancomycin 1 gm IV; The antibiotic was administered within an appropriate time interval prior to skin puncture. ANESTHESIA/SEDATION: Moderate (conscious) sedation was employed during this procedure. A total of Versed 1 mg and Fentanyl 50 mcg was administered intravenously. Moderate Sedation Time: 25 minutes. The patient's level of consciousness and vital signs were monitored continuously by radiology nursing throughout the procedure under my direct supervision. CONTRAST:  None FLUOROSCOPY TIME:  1 minute (27 mGy) COMPLICATIONS: None immediate. PROCEDURE: The procedure, risks, benefits, and alternatives were explained to the patient. Questions regarding the procedure were encouraged and answered. The patient understands and consents to the procedure. The right chest was selected for Port catheter placement secondary to the presence of a left anterior chest wall dual lead pacemaker. Unfortunately, the patient has extremely friable skin including a skin tear over the superior aspect of the right chest regional to desired location of port reservoir placement. Regardless, the right neck and chest were prepped with chlorhexidine in a sterile fashion, and a sterile drape was applied covering the operative field. Maximum barrier sterile technique with sterile gowns and gloves were used for the procedure. A timeout was performed prior to the initiation of the procedure. Local anesthesia was provided with 1% lidocaine with epinephrine. After creating a small venotomy incision, a micropuncture kit was utilized to access the internal jugular vein under direct, real-time ultrasound guidance. Ultrasound image documentation was performed. The microwire was kinked to measure appropriate catheter length. A subcutaneous port pocket was then created along the upper chest wall utilizing a  combination of sharp and blunt dissection. The pocket was irrigated with sterile saline. A single lumen ISP power  injectable port was chosen for placement. The 8 Fr catheter was tunneled from the port pocket site to the venotomy incision. The port was placed in the pocket. The external catheter was trimmed to appropriate length. At the venotomy, an 8 Fr peel-away sheath was placed over a guidewire under fluoroscopic guidance. The catheter was then placed through the sheath and the sheath was removed. Final catheter positioning was confirmed and documented with a fluoroscopic spot radiograph. The port was accessed with a Huber needle, aspirated and flushed with heparinized saline. The venotomy site was closed with derma bond and Steri-Strips. The port pocket incision was closed with interrupted 2-0 Vicryl suture and the skin was opposed with Dermabond and Steri-strips. Dressings were placed. The patient tolerated the procedure well without immediate post procedural complication. FINDINGS: After catheter placement, the tip lies within the superior cavoatrial junction. The catheter aspirates and flushes normally and is ready for immediate use. IMPRESSION: Successful placement of a right internal jugular approach power injectable Port-A-Cath. The catheter is ready for immediate use. As above, the patient has extremely friable skin including a skin tear at the location of Port a catheter reservoir implantation site (note, the left anterior chest cannot be used for port placement secondary the presence of a left anterior chest wall pacemaker). Electronically Signed   By: Sandi Mariscal M.D.   On: 07/22/2015 16:59   Dg Chest Port 1 View  08/21/2015  CLINICAL DATA:  Shortness of breath.  History of lung cancer EXAM: PORTABLE CHEST 1 VIEW COMPARISON:  07/09/2015 FINDINGS: Patchy bilateral lung opacity, greater peripherally on the right. There is a background of bronchitic coarsening and hyperinflation chronic distortion of  the right hilum. No cardiomegaly. Status post CABG and aortic valve replacement. Dual-chamber ICD/ pacer from the left. Porta catheter from the right, tip at the SVC level. No effusion or pneumothorax. IMPRESSION: Bilateral pneumonia. Electronically Signed   By: Monte Fantasia M.D.   On: August 21, 2015 06:09    Microbiology: No results found for this or any previous visit (from the past 240 hour(s)).   Labs: Basic Metabolic Panel:  Recent Labs Lab 08/07/15 1014 08/10/15 1545  NA 136 135*  K 3.9 4.3  CO2 28 27  GLUCOSE 140 107  BUN 44.4* 55.9*  CREATININE 0.9 1.1  CALCIUM 9.3 8.9   Liver Function Tests:  Recent Labs Lab 08/07/15 1014 08/10/15 1545  AST 24 23  ALT 31 38  ALKPHOS 62 64  BILITOT 0.62 0.56  PROT 6.4 6.3*  ALBUMIN 2.4* 2.5*   No results for input(s): LIPASE, AMYLASE in the last 168 hours. No results for input(s): AMMONIA in the last 168 hours. CBC:  Recent Labs Lab 08/07/15 1014 08/10/15 1545  WBC 3.1* 1.6*  NEUTROABS 2.5 1.4*  HGB 11.4* 11.4*  HCT 33.3* 34.0*  MCV 91.5 91.6  PLT 214 187   Cardiac Enzymes: No results for input(s): CKTOTAL, CKMB, CKMBINDEX, TROPONINI in the last 168 hours. D-Dimer No results for input(s): DDIMER in the last 72 hours. BNP: Invalid input(s): POCBNP CBG: No results for input(s): GLUCAP in the last 168 hours. Anemia work up No results for input(s): VITAMINB12, FOLATE, FERRITIN, TIBC, IRON, RETICCTPCT in the last 72 hours. Urinalysis    Component Value Date/Time   COLORURINE YELLOW 07/09/2015 2053   APPEARANCEUR CLEAR 07/09/2015 2053   LABSPEC 1.020 07/09/2015 2053   PHURINE 6.5 07/09/2015 2053   GLUCOSEU NEGATIVE 07/09/2015 2053   HGBUR NEGATIVE 07/09/2015 2053   [place] NEGATIVE 07/09/2015 2053  KETONESUR 15* 07/09/2015 2053   PROTEINUR NEGATIVE 07/09/2015 2053   UROBILINOGEN 0.2 09/02/2011 1115   NITRITE NEGATIVE 07/09/2015 2053   LEUKOCYTESUR NEGATIVE 07/09/2015 2053   Sepsis Labs Invalid  input(s): PROCALCITONIN,  WBC,  LACTICIDVEN     SIGNED:  Birdie Hopes, MD  [facility] 2015/08/22, 6:10 PM Pager   If 7PM-7AM, please contact night-coverage [email or web address] Password [email or web address]

## 2015-08-25 NOTE — Assessment & Plan Note (Signed)
Patient appears increasingly weak and dehydrated today.  Since patient has a history of CHF in the past; patient was only given approximate 750 mg normal saline IV fluid rehydration while cancer Center today.

## 2015-08-25 DEATH — deceased

## 2015-08-26 ENCOUNTER — Ambulatory Visit: Payer: No Typology Code available for payment source

## 2015-08-27 ENCOUNTER — Ambulatory Visit: Payer: No Typology Code available for payment source

## 2015-08-28 ENCOUNTER — Ambulatory Visit: Payer: No Typology Code available for payment source

## 2015-08-31 ENCOUNTER — Ambulatory Visit: Payer: No Typology Code available for payment source

## 2015-09-04 NOTE — Progress Notes (Incomplete)
°  Radiation Oncology         [phone]) [phone] ________________________________  Name: Gabriel Marsh MRN: [id number]  Date: 08/12/2015  DOB: 1931/01/01  End of Treatment Note  Diagnosis:      ICD-9-CM ICD-10-CM   1. Malignant neoplasm of lower lobe of right lung ([place]) 162.5 C34.31        Indication for treatment:  Curative       Radiation treatment dates:   07/16/2015 to 08/12/2015  Site/dose:    1. The lung / mediastinum was treated to 38 Gy in 19 fractions at 2 Gy per fraction, out of a planned total dose of 60 Gy in 30 fractions at 2 Gy per fraction. 2. The lung / mediastinum was boosted to 0 Gy in 0 fractions, out of a planned total dose of 6 Gy in 3 fractions at 2 Gy per fraction.  Beams/energy:    1. 3D // 10X, 6X 2. IMRT // 6X  Narrative: The patient received 19 total radiation treatment fractions before he passed away.   Plan: The patient has completed radiation treatment. The patient will return to radiation oncology clinic for routine followup in one month. I advised them to call or return sooner if they have any questions or concerns related to their recovery or treatment.  ------------------------------------------------  Jodelle Gross, MD, PhD  This document serves as a record of services personally performed by Kyung Rudd, MD. It was created on his behalf by Arlyce Harman, a trained medical scribe. The creation of this record is based on the scribe's personal observations and the provider's statements to them. This document has been checked and approved by the attending provider.

## 2015-09-28 NOTE — Progress Notes (Signed)
  Radiation Oncology         [phone] ________________________________  Name: Gabriel Marsh MRN: [id number]  Date: 07/15/2015  DOB: 19-Apr-1930  COMPLEX SIMULATION  NOTE  Diagnosis: lung cancer  Narrative The patient has undergone a complex simulation for the patient's upcoming boost treatment.   Radiation dose prior to boost: 6 Gy  Boost dose to the high risk target:  60 Gy, to be delivered in 30 fractions  To accomplish the boost treatment, an IMRT technique will be necessary. This is medically necessary to treat the desired high-risk target region while adequately sparing the critical nearby normal structures. This is due to the extent of the patient's disease on presentation. Daily image guidance will be continued to ensure proper localization of the target region and adequate sparing of the critical normal structures.   Total dose after boost:  66 Gy    ________________________________   Jodelle Gross, MD, PhD

## 2015-09-28 NOTE — Progress Notes (Signed)
  Radiation Oncology         [phone] ________________________________  Name: Gabriel Marsh MRN: [id number]  Date: 07/10/2015  DOB: 07-24-1930  SIMULATION AND TREATMENT PLANNING NOTE  DIAGNOSIS:     ICD-9-CM ICD-10-CM   1. Cancer of [place]ina of right bronchus (HCC) 162.2 C34.01       NARRATIVE:  The patient was brought to the [facility].  Identity was confirmed.  All relevant records and images related to the planned course of therapy were reviewed.   Written consent to proceed with treatment was confirmed which was freely given after reviewing the details related to the planned course of therapy had been reviewed with the patient.  Then, the patient was set-up in a stable reproducible supine position for radiation therapy.  The patient's arms were raised above using a wing board device. CT images were obtained.  An isocenter was placed within the chest in relation to the target volume. Surface markings were placed.    The CT images were loaded into the planning software.  Then the target and avoidance structures were contoured.  Treatment planning then occurred.  The radiation prescription was entered and confirmed.  A total of 3 complex treatment devices were fabricated which correspond to the designed customized radiation treatment fields. Each of these customized fields/ complex treatment devices will be used on a daily basis during the radiation course. I have requested a 3D Simulation.  I have requested a DVH of the following structures: target volume, spinal cord, lungs, esophagus.   The patient will undergo daily image guidance to ensure accurate localization of the target, and adequate minimize dose to the normal surrounding structures in close proximity to the target.   PLAN:  The patient will initially receive 6 Gy in 3 fractions. It is anticipated that the patient will then received a 60 gray boost. The patient's final total dose will be 66 gray.   Special  treatment procedure The patient will receive chemotherapy during the course of radiation treatment. The patient may experience increased or overlapping toxicity due to this combined-modality approach and the patient will be monitored for such problems. This may include extra lab work as necessary. This therefore constitutes a special treatment procedure.   ________________________________   Jodelle Gross, MD, PhD

## 2015-09-28 NOTE — Addendum Note (Signed)
Encounter addended by: Kyung Rudd, MD on: 09/28/2015  8:20 PM<BR>    Actions taken: Visit diagnoses modified, Sign clinical note

## 2016-01-02 ENCOUNTER — Other Ambulatory Visit: Payer: Self-pay | Admitting: Nurse Practitioner
